# Patient Record
Sex: Male | Born: 1940
Health system: Northeastern US, Academic
[De-identification: ages and names within clinical notes are randomized; demographics above are authoritative.]

## PROBLEM LIST (undated history)

## (undated) DIAGNOSIS — H539 Unspecified visual disturbance: Secondary | ICD-10-CM

## (undated) DIAGNOSIS — R251 Tremor, unspecified: Secondary | ICD-10-CM

## (undated) DIAGNOSIS — M25512 Pain in left shoulder: Secondary | ICD-10-CM

## (undated) DIAGNOSIS — I639 Cerebral infarction, unspecified: Secondary | ICD-10-CM

## (undated) DIAGNOSIS — I10 Essential (primary) hypertension: Secondary | ICD-10-CM

## (undated) DIAGNOSIS — N200 Calculus of kidney: Secondary | ICD-10-CM

## (undated) DIAGNOSIS — T560X1A Toxic effect of lead and its compounds, accidental (unintentional), initial encounter: Secondary | ICD-10-CM

## (undated) DIAGNOSIS — G8929 Other chronic pain: Secondary | ICD-10-CM

## (undated) HISTORY — DX: Cerebral infarction, unspecified: I63.9

## (undated) HISTORY — DX: Unspecified visual disturbance: H53.9

## (undated) HISTORY — PX: CHOLECYSTECTOMY: SHX55

## (undated) HISTORY — PX: LITHOTRIPSY: SUR834

---

## 2005-02-27 ENCOUNTER — Emergency Department (HOSPITAL_COMMUNITY): Admission: EM | Admit: 2005-02-27 | Discharge: 2005-02-27 | Payer: Self-pay | Admitting: Emergency Medicine

## 2005-10-19 ENCOUNTER — Ambulatory Visit (HOSPITAL_COMMUNITY): Admission: RE | Admit: 2005-10-19 | Discharge: 2005-10-19 | Payer: Self-pay | Admitting: Urology

## 2010-11-16 ENCOUNTER — Other Ambulatory Visit: Payer: Self-pay | Admitting: Specialist

## 2010-11-16 DIAGNOSIS — R251 Tremor, unspecified: Secondary | ICD-10-CM

## 2010-11-18 ENCOUNTER — Ambulatory Visit
Admission: RE | Admit: 2010-11-18 | Discharge: 2010-11-18 | Disposition: A | Discharge status: Home / Self Care | Payer: Medicare Other | Source: Ambulatory Visit | Attending: Specialist | Admitting: Specialist

## 2010-11-18 DIAGNOSIS — R251 Tremor, unspecified: Secondary | ICD-10-CM

## 2011-10-25 ENCOUNTER — Other Ambulatory Visit (HOSPITAL_COMMUNITY): Payer: Self-pay | Admitting: Family Medicine

## 2011-10-25 DIAGNOSIS — E041 Nontoxic single thyroid nodule: Secondary | ICD-10-CM

## 2011-10-31 ENCOUNTER — Other Ambulatory Visit: Payer: Self-pay | Admitting: Neurosurgery

## 2011-10-31 DIAGNOSIS — M5412 Radiculopathy, cervical region: Secondary | ICD-10-CM

## 2011-11-01 ENCOUNTER — Encounter (HOSPITAL_COMMUNITY)
Admission: RE | Admit: 2011-11-01 | Discharge: 2011-11-01 | Disposition: A | Discharge status: Home / Self Care | Payer: Medicare Other | Source: Ambulatory Visit | Attending: Family Medicine | Admitting: Family Medicine

## 2011-11-01 DIAGNOSIS — E041 Nontoxic single thyroid nodule: Secondary | ICD-10-CM | POA: Insufficient documentation

## 2011-11-02 ENCOUNTER — Encounter (HOSPITAL_COMMUNITY)
Admission: RE | Admit: 2011-11-02 | Discharge: 2011-11-02 | Disposition: A | Discharge status: Home / Self Care | Payer: Medicare Other | Source: Ambulatory Visit | Attending: Family Medicine | Admitting: Family Medicine

## 2011-11-02 MED ORDER — SODIUM PERTECHNETATE TC 99M INJECTION
10.1000 | Freq: Once | INTRAVENOUS | Status: AC | PRN
  Administered 2011-11-02: 10.1 via INTRAVENOUS

## 2011-11-02 MED ORDER — SODIUM IODIDE I 131 CAPSULE: 14.1000 | Freq: Once | INTRAVENOUS | Status: AC | PRN

## 2011-11-05 ENCOUNTER — Ambulatory Visit
Admission: RE | Admit: 2011-11-05 | Discharge: 2011-11-05 | Disposition: A | Discharge status: Home / Self Care | Payer: Medicare Other | Source: Ambulatory Visit | Attending: Neurosurgery | Admitting: Neurosurgery

## 2011-11-05 DIAGNOSIS — M5412 Radiculopathy, cervical region: Secondary | ICD-10-CM

## 2011-11-09 ENCOUNTER — Ambulatory Visit (INDEPENDENT_AMBULATORY_CARE_PROVIDER_SITE_OTHER): Payer: Medicare Other | Admitting: Sports Medicine

## 2011-11-09 VITALS — BP 205/120 | Ht 68.0 in | Wt 197.0 lb

## 2011-11-09 DIAGNOSIS — M67919 Unspecified disorder of synovium and tendon, unspecified shoulder: Secondary | ICD-10-CM

## 2011-11-09 DIAGNOSIS — M719 Bursopathy, unspecified: Secondary | ICD-10-CM

## 2011-11-09 DIAGNOSIS — M75101 Unspecified rotator cuff tear or rupture of right shoulder, not specified as traumatic: Secondary | ICD-10-CM

## 2011-11-09 DIAGNOSIS — I10 Essential (primary) hypertension: Secondary | ICD-10-CM

## 2011-11-09 DIAGNOSIS — G20A1 Parkinson's disease without dyskinesia, without mention of fluctuations: Secondary | ICD-10-CM

## 2011-11-09 DIAGNOSIS — G20 Parkinson's disease: Secondary | ICD-10-CM

## 2011-11-09 DIAGNOSIS — M7520 Bicipital tendinitis, unspecified shoulder: Secondary | ICD-10-CM

## 2011-11-09 MED ORDER — NITROGLYCERIN 0.2 MG/HR TD PT24: MEDICATED_PATCH | TRANSDERMAL | Status: DC

## 2011-11-09 NOTE — Assessment & Plan Note (Signed)
He should begin some treatment for his hypertension I recommended with his primary care physician and started on medication to control this a better level.

## 2011-11-09 NOTE — Assessment & Plan Note (Signed)
Begin on a nitroglycerin protocol as I think he has some chance of healing this.  Avoid any heavy weights or any activities that make the shoulder feel more painful. Begin uneasy rehabilitation program with only 5 pound dumbbells.  Recheck this in one month and repeat his ultrasound scan.
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
## 2011-11-09 NOTE — Assessment & Plan Note (Signed)
Exam strongly suggest this and he has been advised of same by a physician he saw her earlier. I think he needs to consider further workup and possible treatment.
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
## 2011-11-09 NOTE — Assessment & Plan Note (Signed)
I gave him some specific exercises to keep the biceps strong. He needs to do these with low weights but emphasize curls and supination along with some shoulder flexion.

## 2011-12-19 ENCOUNTER — Ambulatory Visit: Payer: Medicare Other | Admitting: Sports Medicine

## 2012-01-01 ENCOUNTER — Telehealth: Payer: Self-pay | Admitting: *Deleted

## 2012-01-01 NOTE — Telephone Encounter (Signed)
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  Would like Dr. Darrick Penna to recommend an occupational health specialist.  He has heard lead toxicity could contribute to his hand trimmer.

## 2012-01-01 NOTE — Telephone Encounter (Signed)
Message copied by Mora Bellman on Mon Jan 01, 2012  4:00 PM ------      Message from: CERESI, [place] L      Created: Mon Jan 01, 2012  3:44 PM      Regarding: phone message      Contact: [phone]       Pt would like a referral for [facility] to be evaluated for  lead levels. Please call pt.

## 2012-01-03 NOTE — Telephone Encounter (Signed)
Per Dr. Darrick Penna- advised pt he could contact Kathrine Haddock MD- [facility] for [facility] system.  Pt agreeable.

## 2012-01-10 ENCOUNTER — Ambulatory Visit
Admission: RE | Admit: 2012-01-10 | Discharge: 2012-01-10 | Disposition: A | Discharge status: Home / Self Care | Payer: Medicare Other | Source: Ambulatory Visit | Attending: Sports Medicine | Admitting: Sports Medicine

## 2012-01-10 ENCOUNTER — Ambulatory Visit (INDEPENDENT_AMBULATORY_CARE_PROVIDER_SITE_OTHER): Payer: Medicare Other | Admitting: Sports Medicine

## 2012-01-10 VITALS — BP 144/92 | Ht 68.0 in | Wt 198.0 lb

## 2012-01-10 DIAGNOSIS — M25552 Pain in left hip: Secondary | ICD-10-CM | POA: Insufficient documentation

## 2012-01-10 DIAGNOSIS — M25559 Pain in unspecified hip: Secondary | ICD-10-CM

## 2012-01-10 DIAGNOSIS — M67919 Unspecified disorder of synovium and tendon, unspecified shoulder: Secondary | ICD-10-CM

## 2012-01-10 DIAGNOSIS — M75101 Unspecified rotator cuff tear or rupture of right shoulder, not specified as traumatic: Secondary | ICD-10-CM
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
## 2012-01-10 NOTE — Patient Instructions (Addendum)
Very nice to meet you.  Your tear in your supraspinatus has improved.  Keep doing the exercises. Keep the light weight as well.  You can try the nitro patch again but if giving too much of a headache then stop, you are healing without it.  We would like to see you back again in 6 weeks and we will rescan.

## 2012-01-10 NOTE — Assessment & Plan Note (Signed)
Improving. Unable to tolerate nitroglycerin encouraged to try a quarter of a patch. Patient will continue doing the exercises, patient had to discuss Prilosec therapy which we do not know enough evidence of the to suggest. Patient will followup in 6 weeks for rescan to make sure the patient is improving. On ultrasound today patient did have significant improvement in tear reducing from greater than 50% down to 30%. Patient also did have some neovascularization seen showing likelihood of healing process.

## 2012-01-12 ENCOUNTER — Telehealth: Payer: Self-pay | Admitting: Family Medicine

## 2012-01-12 NOTE — Telephone Encounter (Signed)
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  Give him a call and let him know this probably does explain the hip aching at night.  No sign of AVN.  tks      ----- Message -----         From: Rad Results In Interface         Sent: 01/10/2012   1:34 PM           To: Enid Baas, MD

## 2012-01-12 NOTE — Telephone Encounter (Signed)
LVM stating that X-ray look good, did not want to leave details on voicemail.  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

## 2012-04-17 ENCOUNTER — Ambulatory Visit (INDEPENDENT_AMBULATORY_CARE_PROVIDER_SITE_OTHER): Payer: Medicare Other | Admitting: Sports Medicine

## 2012-04-17 VITALS — BP 132/80 | Ht 68.0 in | Wt 192.0 lb

## 2012-04-17 DIAGNOSIS — G20 Parkinson's disease: Secondary | ICD-10-CM

## 2012-04-17 DIAGNOSIS — M75101 Unspecified rotator cuff tear or rupture of right shoulder, not specified as traumatic: Secondary | ICD-10-CM

## 2012-04-17 DIAGNOSIS — M5020 Other cervical disc displacement, unspecified cervical region: Secondary | ICD-10-CM

## 2012-04-17 DIAGNOSIS — M719 Bursopathy, unspecified: Secondary | ICD-10-CM

## 2012-04-17 NOTE — Assessment & Plan Note (Signed)
He is seeing Dr.andreas runheim at [facility]   planning to start him on Sinemet  I would wonder if this will have some benefit on his shoulder symptoms as I think he has tightness in his right arm that seems related to the Parkinson's

## 2012-04-17 NOTE — Assessment & Plan Note (Signed)
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

## 2012-04-17 NOTE — Assessment & Plan Note (Signed)
The rotator cuff muscles have regained normal strength and he seems to be healing very well  Keep up light weight rehabilitation exercises and we can recheck this in 2-3 months
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
## 2012-05-07 ENCOUNTER — Ambulatory Visit (INDEPENDENT_AMBULATORY_CARE_PROVIDER_SITE_OTHER): Payer: Medicare Other | Admitting: Family Medicine

## 2012-05-07 VITALS — BP 171/111 | Ht 68.0 in | Wt 192.0 lb

## 2012-05-07 DIAGNOSIS — M25552 Pain in left hip: Secondary | ICD-10-CM

## 2012-05-07 DIAGNOSIS — M25559 Pain in unspecified hip: Secondary | ICD-10-CM

## 2012-05-07 DIAGNOSIS — M67919 Unspecified disorder of synovium and tendon, unspecified shoulder: Secondary | ICD-10-CM

## 2012-05-07 DIAGNOSIS — M999 Biomechanical lesion, unspecified: Secondary | ICD-10-CM | POA: Insufficient documentation

## 2012-05-07 DIAGNOSIS — M75101 Unspecified rotator cuff tear or rupture of right shoulder, not specified as traumatic: Secondary | ICD-10-CM

## 2012-05-07 NOTE — Assessment & Plan Note (Signed)
Looking at patient's x-rays again I do think likelihood his pain is from some osteoporosis of the left hip. Patient is responding to osteopathic manipulation will continue to do as he continues to help. Patient though it does not improve will call again and we will do a hip injection under ultrasound guidance. Patient encouraged to continue to stay active and will continue to monitor. Patient will followup in 4-6 weeks for another manipulation or sooner if he would like to have the intra-articular hip injection.Eric Pope

## 2012-05-07 NOTE — Assessment & Plan Note (Signed)
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 I also thinkpatient does have some muscle spasm secondary to his Parkinson's disease and it also correlates with findings.
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
## 2012-05-07 NOTE — Assessment & Plan Note (Signed)
We did not rescan today and seems that patient is improving. Patient is having any setbacks or at followup we'll consider doing repeat imaging of the shoulder. Continue current therapy.

## 2012-05-08 ENCOUNTER — Telehealth: Payer: Self-pay | Admitting: Family Medicine
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
## 2012-06-04 ENCOUNTER — Encounter: Payer: Self-pay | Admitting: Family Medicine

## 2012-06-04 ENCOUNTER — Ambulatory Visit (INDEPENDENT_AMBULATORY_CARE_PROVIDER_SITE_OTHER): Payer: Medicare Other | Admitting: Family Medicine

## 2012-06-04 VITALS — BP 152/107 | HR 63 | Ht 68.0 in | Wt 192.0 lb

## 2012-06-04 DIAGNOSIS — M25559 Pain in unspecified hip: Secondary | ICD-10-CM

## 2012-06-04 DIAGNOSIS — M25552 Pain in left hip: Secondary | ICD-10-CM

## 2012-06-04 NOTE — Assessment & Plan Note (Signed)
Significant hip arthritis. Patient did have injection today.
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
## 2012-06-18 ENCOUNTER — Ambulatory Visit (INDEPENDENT_AMBULATORY_CARE_PROVIDER_SITE_OTHER): Payer: Medicare Other | Admitting: Family Medicine

## 2012-06-18 VITALS — BP 134/86 | Ht 68.0 in | Wt 192.0 lb

## 2012-06-18 DIAGNOSIS — M67919 Unspecified disorder of synovium and tendon, unspecified shoulder: Secondary | ICD-10-CM

## 2012-06-18 DIAGNOSIS — M25552 Pain in left hip: Secondary | ICD-10-CM

## 2012-06-18 DIAGNOSIS — M25559 Pain in unspecified hip: Secondary | ICD-10-CM

## 2012-06-18 DIAGNOSIS — M75101 Unspecified rotator cuff tear or rupture of right shoulder, not specified as traumatic: Secondary | ICD-10-CM

## 2012-06-18 NOTE — Progress Notes (Signed)
Patient is here actually for followup of his left hip pain as well as his right shoulder pain.   Patient was seen for his shoulder problem back in April 17, 2012. Patient was diagnosed with a right rotator cuff syndrome as well as cervical disc herniation. 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 Patient though states that the more he tries to lift at this time it seems to be giving him trouble and give him more pain. Patient points to the pain is mostly on the superior aspect of the shoulder that radiates to the lateral aspect. Patient states that overhead activity seems to be what caused the most discomfort. Patient denies any radiation of the pain down his arm or any numbness in his hand. Patient does have resting tremor on this side.  Patient regarding his left hip pain states that it is significantly improved. Patient did have intra-articular injection last time under ultrasound guidance. Patient states he is having no problem with it at this time and is very happy with the results.  Physical exam Blood pressure 134/86, height 5\' 8"  (1.727 m), weight 192 lb (87.091 kg). General: No apparent distress alert and oriented x3 mood and affect somewhat normal but masked facies. Respiratory: Patient's speak in full sentences and does not appear short of breath Skin: Warm dry intact with no signs of infection or rash Neuro: Cranial nerves II through XII are intact, neurovascularly intact in all extremities with 2+ DTRs and 2+ pulses. Right shoulder exam.  Pain with full abduction and forward flexion  Lacks 10 degrees with forward flexion and full abduction  Back scratch on rt is limited to T12, can go to T7 on lt  Strong empty can bilat  Hawkins and neer's Positive on rt  ER and IR strong on rt  ER and IR strong on rt with elbows bent 90 degrees  ER lag test negative on rt  IR  lag test on rt not as good on lt  Has persistent tremor on rt

## 2012-06-18 NOTE — Assessment & Plan Note (Signed)
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 Patient didn't tolerate the intra-articular procedure very well last time and has had significant improvement since. Continue range of motion exercises and strengthening exercises. Patient will followup as needed for this problem.

## 2012-06-18 NOTE — Assessment & Plan Note (Addendum)
Procedure note After verbal and written consent given pt was prepped with betadine.  1:4 kenalog 40 to lidocaine used in right shoulder with a 25-gauge 1- 1/2 inch needle.  Pt minimal bleeding dressed with band aid, Pt given red flags to look for pt had better pain control immediatly.   Patient had more signs of partial impingement syndrome to ensure rotator cuff tear today on exam. Patient did not have any significant findings on ultrasound. Patient did tolerate the procedure well and given home exercise program to do a regular basis. Patient will followup in 6 weeks if not better and we'll consider doing an MRI. In addition to this if he did not feel that this improves therapeutically and this may make [place] or if further imaging of his cervical neck as well.

## 2012-07-09 ENCOUNTER — Ambulatory Visit (INDEPENDENT_AMBULATORY_CARE_PROVIDER_SITE_OTHER): Payer: Medicare Other | Admitting: Family Medicine

## 2012-07-09 VITALS — BP 130/72 | Ht 68.0 in | Wt 195.0 lb

## 2012-07-09 DIAGNOSIS — M76899 Other specified enthesopathies of unspecified lower limb, excluding foot: Secondary | ICD-10-CM

## 2012-07-09 DIAGNOSIS — G20 Parkinson's disease: Secondary | ICD-10-CM

## 2012-07-09 DIAGNOSIS — M67919 Unspecified disorder of synovium and tendon, unspecified shoulder: Secondary | ICD-10-CM

## 2012-07-09 DIAGNOSIS — G20A1 Parkinson's disease without dyskinesia, without mention of fluctuations: Secondary | ICD-10-CM

## 2012-07-09 DIAGNOSIS — M75101 Unspecified rotator cuff tear or rupture of right shoulder, not specified as traumatic: Secondary | ICD-10-CM

## 2012-07-09 DIAGNOSIS — M7062 Trochanteric bursitis, left hip: Secondary | ICD-10-CM

## 2012-07-09 DIAGNOSIS — M719 Bursopathy, unspecified: Secondary | ICD-10-CM

## 2012-07-09 NOTE — Progress Notes (Signed)
Chief complaint left hip pain  Patient has been seen for his left hip pain and did have an intra-articular injection into his left hip. Patient did have some resolution of pain for approximately 2 weeks and has slowly started to hurt again. Patient states that the pain is mostly on the lateral aspect of the hip instead of the groin pain that he is having previously. Patient states it does hurt with certain movements and when he wakes up in the morning he has an exaggerated amount of pain. Patient denies any radiation down the leg denies any numbness or tingling. Patient denies any bowel or bladder incontinence or any association with back pain.  Physical exam Blood pressure 130/72, height 5\' 8"  (1.727 m), weight 195 lb (88.451 kg). General: No apparent distress alert and oriented x3 mood and affect somewhat normal but masked facies. And has a resting tremor on the right upper extremity. Respiratory: Patient's speak in full sentences and does not appear short of breath  Skin: Warm dry intact with no signs of infection or rash  Neuro: Cranial nerves II through XII are intact, neurovascularly intact in all extremities with 2+ DTRs and 2+ pulses.  Left hip exam: Patient does have pain with internal rotation mostly in the groin area. Patient is very tender to palpation over the greater trochanteric area and does have a mild fullness in this area. Patient has a negative straight leg test. Patient has good flexion and extension of the hip. He is neurovascularly intact distally. Right Shoulder exam shows the patient does have increasing range of motion. Patient does have internal rotation to T10 which is an improvement from L1 previously. Patient has negative impingement signs of the right shoulder. Rotator cuff strength is intact.

## 2012-07-09 NOTE — Assessment & Plan Note (Signed)
After verbal and written consent patient was prepped with alcohol swabs in a sterile fashion then did have injected in 5 cc of 0.5% Marcaine and 2 cc of 40 mg/dL Depo-Medrol into the greater trochanteric bursa area. Patient tolerated the procedure well with no blood loss. Patient had some improvement in pain immediately.  Patient given home exercise program with different exercises to do on a regular basis. Patient can continue his natural anti-inflammatory medications. Patient did up the idea of prolo therapy which I told him I do not think would harm anything but did not know if it would be beneficial per the evidence. Patient will return as needed.

## 2012-07-09 NOTE — Assessment & Plan Note (Signed)
Encourage patient again to potentially have this reviewed which patient declined. Patient did not want to see a specialist. Patient still corresponds his tremor to a high lead level.

## 2012-07-09 NOTE — Assessment & Plan Note (Signed)
Improving after steroid injection.

## 2012-07-09 NOTE — Patient Instructions (Signed)
Always good to see you. I am glad your shoulder is doing better. I did inject her greater trochanteric bursa today and I hope this helps her hip pain. I do think Prolo injection is worth a shot. If this as well as the Prolo therapy does not seem to help I do think you would need to talk to an orthopedic surgeon. You know where I am if you need me.

## 2013-01-07 ENCOUNTER — Ambulatory Visit: Payer: Medicare Other | Admitting: Family Medicine

## 2013-01-09 ENCOUNTER — Telehealth: Payer: Self-pay | Admitting: *Deleted

## 2013-01-09 ENCOUNTER — Encounter: Payer: Self-pay | Admitting: Family Medicine

## 2013-01-09 ENCOUNTER — Ambulatory Visit (INDEPENDENT_AMBULATORY_CARE_PROVIDER_SITE_OTHER): Payer: Medicare Other | Admitting: Family Medicine

## 2013-01-09 VITALS — BP 138/84 | HR 61 | Wt 199.0 lb

## 2013-01-09 DIAGNOSIS — M25559 Pain in unspecified hip: Secondary | ICD-10-CM

## 2013-01-09 DIAGNOSIS — M25512 Pain in left shoulder: Secondary | ICD-10-CM

## 2013-01-09 DIAGNOSIS — M76899 Other specified enthesopathies of unspecified lower limb, excluding foot: Secondary | ICD-10-CM

## 2013-01-09 DIAGNOSIS — M7062 Trochanteric bursitis, left hip: Secondary | ICD-10-CM

## 2013-01-09 DIAGNOSIS — M25552 Pain in left hip: Secondary | ICD-10-CM

## 2013-01-09 DIAGNOSIS — M999 Biomechanical lesion, unspecified: Secondary | ICD-10-CM | POA: Insufficient documentation

## 2013-01-09 NOTE — Telephone Encounter (Signed)
Left msg on triage stating wanting to speak with Dr.Smith about getting a referral to see orthopedic concerning his shoulder...lmb
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
## 2013-01-09 NOTE — Assessment & Plan Note (Signed)
Patient on testing also has weakness of hip abductors. Patient given hip abductor exercises as well as SI joint mobilization.> 40 minutes with patient today he is greater than 50% counseling on diagnosis and treatment. I also showed him different exercises and ways to help his activities of daily living. Encourage him to see neurologist again for Parkinson's disease. Patient will try conservative therapy and does not get better we'll have him come back. At that time we'll do ultrasound of the hip as well as likely injection.

## 2013-01-09 NOTE — Assessment & Plan Note (Signed)
Decision today to treat with OMT was based on Physical Exam  After verbal consent patient was treated with  ME, FPR techniques in  thoracic, lumbar and sacral areas  Patient tolerated the procedure well with improvement in symptoms  Patient given exercises, stretches and lifestyle modifications  See medications in patient instructions if given  Patient will follow up in 4-6 weeks 

## 2013-01-09 NOTE — Assessment & Plan Note (Signed)
Decision today to treat with OMT was based on Physical Exam  After verbal consent patient was treated with ME, FPR techniques in thoracic, lumbar and sacral  And illium areas  Patient tolerated the procedure well with improvement in symptoms  Patient given exercises, stretches and lifestyle modifications  See medications in patient instructions if given  Patient will follow up in 4-6 weeks

## 2013-01-09 NOTE — Patient Instructions (Signed)
Sacroiliac Joint Mobilization and Rehab 1. Work on pretzel stretching, shoulder back and leg draped in front. 3-5 sets, 30 sec.. 2. hip abductor rotations. standing, hip flexion and rotation outward then inward. 3 sets, 15 reps. when can do comfortably, add ankle weights starting at 2 pounds.  3. cross over stretching - shoulder back to ground, same side leg crossover. 3-5 sets for 30 min..  4. rolling up and back knees to chest and rocking. 5. sacral tilt - 5 sets, hold for 5-10 seconds  Really focus on hip abductor strengthening  Also consider yoga  Think about ESI- Talk to your nuerosurgeon.   Come back in 2-3 weeks and if still have pain on side of hip will do injection.
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
## 2013-01-10 NOTE — Telephone Encounter (Signed)
Please advise 

## 2013-01-10 NOTE — Telephone Encounter (Signed)
Referral sent in. Please call and tell him I more than willing to see him for his shoulder again to see if I can help but if he would like to have put in referral for him to see Dr. Ave Filter at [facility] orth[facility]. If he would like to see the orthopedic surgeon please make an appointment for him.

## 2013-01-10 NOTE — Telephone Encounter (Signed)
Left msg on vmail

## 2013-02-05 ENCOUNTER — Other Ambulatory Visit: Payer: Self-pay | Admitting: Orthopedic Surgery

## 2013-02-05 ENCOUNTER — Ambulatory Visit: Payer: Medicare Other | Admitting: Sports Medicine

## 2013-02-05 DIAGNOSIS — M25511 Pain in right shoulder: Secondary | ICD-10-CM

## 2013-02-14 ENCOUNTER — Ambulatory Visit
Admission: RE | Admit: 2013-02-14 | Discharge: 2013-02-14 | Disposition: A | Discharge status: Home / Self Care | Payer: Medicare Other | Source: Ambulatory Visit | Attending: Orthopedic Surgery | Admitting: Orthopedic Surgery

## 2013-02-14 DIAGNOSIS — M25511 Pain in right shoulder: Secondary | ICD-10-CM

## 2013-02-27 ENCOUNTER — Other Ambulatory Visit: Payer: Medicare Other

## 2013-03-02 ENCOUNTER — Ambulatory Visit
Admission: RE | Admit: 2013-03-02 | Discharge: 2013-03-02 | Disposition: A | Discharge status: Home / Self Care | Payer: Medicare Other | Source: Ambulatory Visit | Attending: Orthopedic Surgery | Admitting: Orthopedic Surgery

## 2013-03-06 ENCOUNTER — Other Ambulatory Visit: Payer: Medicare Other

## 2016-07-10 ENCOUNTER — Encounter (HOSPITAL_COMMUNITY): Payer: Self-pay

## 2016-07-10 ENCOUNTER — Inpatient Hospital Stay (HOSPITAL_COMMUNITY)
Admission: EM | Admit: 2016-07-10 | Discharge: 2016-07-14 | DRG: 065 | Disposition: A | Discharge status: Rehabilitation Facility | Payer: Medicare Other | Attending: Internal Medicine | Admitting: Internal Medicine

## 2016-07-10 ENCOUNTER — Emergency Department (HOSPITAL_COMMUNITY): Payer: Medicare Other

## 2016-07-10 DIAGNOSIS — R001 Bradycardia, unspecified: Secondary | ICD-10-CM | POA: Diagnosis present

## 2016-07-10 DIAGNOSIS — H539 Unspecified visual disturbance: Secondary | ICD-10-CM | POA: Diagnosis present

## 2016-07-10 DIAGNOSIS — R414 Neurologic neglect syndrome: Secondary | ICD-10-CM | POA: Diagnosis present

## 2016-07-10 DIAGNOSIS — R259 Unspecified abnormal involuntary movements: Secondary | ICD-10-CM

## 2016-07-10 DIAGNOSIS — G20 Parkinson's disease: Secondary | ICD-10-CM | POA: Diagnosis present

## 2016-07-10 DIAGNOSIS — R29711 NIHSS score 11: Secondary | ICD-10-CM | POA: Diagnosis present

## 2016-07-10 DIAGNOSIS — Y92410 Unspecified street and highway as the place of occurrence of the external cause: Secondary | ICD-10-CM | POA: Diagnosis not present

## 2016-07-10 DIAGNOSIS — I350 Nonrheumatic aortic (valve) stenosis: Secondary | ICD-10-CM | POA: Diagnosis present

## 2016-07-10 DIAGNOSIS — I63511 Cerebral infarction due to unspecified occlusion or stenosis of right middle cerebral artery: Secondary | ICD-10-CM | POA: Diagnosis present

## 2016-07-10 DIAGNOSIS — Z79899 Other long term (current) drug therapy: Secondary | ICD-10-CM

## 2016-07-10 DIAGNOSIS — Z7982 Long term (current) use of aspirin: Secondary | ICD-10-CM

## 2016-07-10 DIAGNOSIS — Z833 Family history of diabetes mellitus: Secondary | ICD-10-CM

## 2016-07-10 DIAGNOSIS — I160 Hypertensive urgency: Secondary | ICD-10-CM | POA: Diagnosis present

## 2016-07-10 DIAGNOSIS — Z87442 Personal history of urinary calculi: Secondary | ICD-10-CM | POA: Diagnosis not present

## 2016-07-10 DIAGNOSIS — I4581 Long QT syndrome: Secondary | ICD-10-CM | POA: Diagnosis present

## 2016-07-10 DIAGNOSIS — I459 Conduction disorder, unspecified: Secondary | ICD-10-CM | POA: Diagnosis present

## 2016-07-10 DIAGNOSIS — I10 Essential (primary) hypertension: Secondary | ICD-10-CM | POA: Diagnosis present

## 2016-07-10 DIAGNOSIS — K5900 Constipation, unspecified: Secondary | ICD-10-CM | POA: Diagnosis present

## 2016-07-10 DIAGNOSIS — I4891 Unspecified atrial fibrillation: Secondary | ICD-10-CM

## 2016-07-10 DIAGNOSIS — D496 Neoplasm of unspecified behavior of brain: Secondary | ICD-10-CM

## 2016-07-10 DIAGNOSIS — I63331 Cerebral infarction due to thrombosis of right posterior cerebral artery: Secondary | ICD-10-CM | POA: Diagnosis not present

## 2016-07-10 DIAGNOSIS — G219 Secondary parkinsonism, unspecified: Secondary | ICD-10-CM | POA: Diagnosis not present

## 2016-07-10 DIAGNOSIS — Z8249 Family history of ischemic heart disease and other diseases of the circulatory system: Secondary | ICD-10-CM

## 2016-07-10 DIAGNOSIS — R29810 Facial weakness: Secondary | ICD-10-CM | POA: Diagnosis present

## 2016-07-10 DIAGNOSIS — I639 Cerebral infarction, unspecified: Secondary | ICD-10-CM | POA: Diagnosis present

## 2016-07-10 DIAGNOSIS — R269 Unspecified abnormalities of gait and mobility: Secondary | ICD-10-CM | POA: Diagnosis not present

## 2016-07-10 DIAGNOSIS — I169 Hypertensive crisis, unspecified: Secondary | ICD-10-CM | POA: Diagnosis not present

## 2016-07-10 DIAGNOSIS — E785 Hyperlipidemia, unspecified: Secondary | ICD-10-CM | POA: Diagnosis present

## 2016-07-10 DIAGNOSIS — H53462 Homonymous bilateral field defects, left side: Secondary | ICD-10-CM | POA: Diagnosis present

## 2016-07-10 DIAGNOSIS — I480 Paroxysmal atrial fibrillation: Secondary | ICD-10-CM | POA: Diagnosis present

## 2016-07-10 DIAGNOSIS — T560X1A Toxic effect of lead and its compounds, accidental (unintentional), initial encounter: Secondary | ICD-10-CM

## 2016-07-10 DIAGNOSIS — W1830XA Fall on same level, unspecified, initial encounter: Secondary | ICD-10-CM | POA: Diagnosis present

## 2016-07-10 DIAGNOSIS — I6789 Other cerebrovascular disease: Secondary | ICD-10-CM | POA: Diagnosis not present

## 2016-07-10 DIAGNOSIS — G252 Other specified forms of tremor: Secondary | ICD-10-CM

## 2016-07-10 DIAGNOSIS — I63311 Cerebral infarction due to thrombosis of right middle cerebral artery: Secondary | ICD-10-CM | POA: Diagnosis not present

## 2016-07-10 HISTORY — DX: Toxic effect of lead and its compounds, accidental (unintentional), initial encounter: T56.0X1A

## 2016-07-10 HISTORY — DX: Tremor, unspecified: R25.1

## 2016-07-10 HISTORY — DX: Pain in left shoulder: M25.512

## 2016-07-10 HISTORY — DX: Calculus of kidney: N20.0

## 2016-07-10 HISTORY — DX: Other chronic pain: G89.29

## 2016-07-10 HISTORY — DX: Essential (primary) hypertension: I10

## 2016-07-10 LAB — RAPID URINE DRUG SCREEN, HOSP PERFORMED
Amphetamines: NOT DETECTED
Barbiturates: NOT DETECTED
Benzodiazepines: NOT DETECTED
COCAINE: NOT DETECTED
OPIATES: NOT DETECTED
TETRAHYDROCANNABINOL: NOT DETECTED

## 2016-07-10 LAB — COMPREHENSIVE METABOLIC PANEL
ALBUMIN: 4.8 g/dL (ref 3.5–5.0)
ALT: 21 U/L (ref 17–63)
ANION GAP: 11 (ref 5–15)
AST: 27 U/L (ref 15–41)
Alkaline Phosphatase: 76 U/L (ref 38–126)
BILIRUBIN TOTAL: 1.5 mg/dL — AB (ref 0.3–1.2)
BUN: 11 mg/dL (ref 6–20)
CO2: 26 mmol/L (ref 22–32)
Calcium: 9.9 mg/dL (ref 8.9–10.3)
Chloride: 103 mmol/L (ref 101–111)
Creatinine, Ser: 0.9 mg/dL (ref 0.61–1.24)
GFR calc non Af Amer: >60 mL/min (ref >60)
GLUCOSE: 106 mg/dL — AB (ref 65–99)
POTASSIUM: 4.1 mmol/L (ref 3.5–5.1)
SODIUM: 140 mmol/L (ref 135–145)
Total Protein: 7 g/dL (ref 6.5–8.1)

## 2016-07-10 LAB — I-STAT CHEM 8, ED
BUN: 15 mg/dL (ref 6–20)
Calcium, Ion: 1.23 mmol/L (ref 1.15–1.40)
Chloride: 104 mmol/L (ref 101–111)
Creatinine, Ser: 0.9 mg/dL (ref 0.61–1.24)
Glucose, Bld: 99 mg/dL (ref 65–99)
HCT: 44 % (ref 39.0–52.0)
Hemoglobin: 15 g/dL (ref 13.0–17.0)
Potassium: 4.2 mmol/L (ref 3.5–5.1)
Sodium: 143 mmol/L (ref 135–145)
TCO2: 29 mmol/L (ref 0–100)

## 2016-07-10 LAB — URINALYSIS, ROUTINE W REFLEX MICROSCOPIC
BILIRUBIN URINE: NEGATIVE
Glucose, UA: NEGATIVE mg/dL
HGB URINE DIPSTICK: NEGATIVE
KETONES UR: 5 mg/dL — AB
Leukocytes, UA: NEGATIVE
NITRITE: NEGATIVE
PROTEIN: 30 mg/dL — AB
SPECIFIC GRAVITY, URINE: 1.016 (ref 1.005–1.030)
SQUAMOUS EPITHELIAL / LPF: NONE SEEN
pH: 7 (ref 5.0–8.0)

## 2016-07-10 LAB — CBC
HCT: 45.1 % (ref 39.0–52.0)
Hemoglobin: 15.2 g/dL (ref 13.0–17.0)
MCH: 30.6 pg (ref 26.0–34.0)
MCHC: 33.7 g/dL (ref 30.0–36.0)
MCV: 90.9 fL (ref 78.0–100.0)
Platelets: 193 10*3/uL (ref 150–400)
RBC: 4.96 MIL/uL (ref 4.22–5.81)
RDW: 13 % (ref 11.5–15.5)
WBC: 7.5 10*3/uL (ref 4.0–10.5)

## 2016-07-10 LAB — APTT: aPTT: 29 seconds (ref 24–36)

## 2016-07-10 LAB — I-STAT TROPONIN, ED: Troponin i, poc: 0 ng/mL (ref 0.00–0.08)

## 2016-07-10 LAB — DIFFERENTIAL
Basophils Absolute: 0 10*3/uL (ref 0.0–0.1)
Basophils Relative: 0 %
EOS ABS: 0 10*3/uL (ref 0.0–0.7)
EOS PCT: 1 %
Lymphocytes Relative: 12 %
Lymphs Abs: 0.9 10*3/uL (ref 0.7–4.0)
MONO ABS: 0.5 10*3/uL (ref 0.1–1.0)
Monocytes Relative: 7 %
Neutro Abs: 6.1 10*3/uL (ref 1.7–7.7)
Neutrophils Relative %: 80 %

## 2016-07-10 LAB — PROTIME-INR
INR: 1.04
Prothrombin Time: 13.6 seconds (ref 11.4–15.2)
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
## 2016-07-11 ENCOUNTER — Inpatient Hospital Stay (HOSPITAL_COMMUNITY): Payer: Medicare Other

## 2016-07-11 ENCOUNTER — Encounter (HOSPITAL_COMMUNITY): Payer: Self-pay | Admitting: Internal Medicine

## 2016-07-11 DIAGNOSIS — I639 Cerebral infarction, unspecified: Secondary | ICD-10-CM

## 2016-07-11 DIAGNOSIS — R259 Unspecified abnormal involuntary movements: Secondary | ICD-10-CM

## 2016-07-11 DIAGNOSIS — T560X1A Toxic effect of lead and its compounds, accidental (unintentional), initial encounter: Secondary | ICD-10-CM

## 2016-07-11 DIAGNOSIS — G252 Other specified forms of tremor: Secondary | ICD-10-CM

## 2016-07-11 DIAGNOSIS — G219 Secondary parkinsonism, unspecified: Secondary | ICD-10-CM

## 2016-07-11 DIAGNOSIS — I10 Essential (primary) hypertension: Secondary | ICD-10-CM

## 2016-07-11 DIAGNOSIS — I63311 Cerebral infarction due to thrombosis of right middle cerebral artery: Secondary | ICD-10-CM

## 2016-07-11 LAB — LIPID PANEL
Cholesterol: 197 mg/dL (ref 0–200)
HDL: 66 mg/dL (ref >40)
LDL CALC: 118 mg/dL — AB (ref 0–99)
TRIGLYCERIDES: 65 mg/dL (ref <150)
Total CHOL/HDL Ratio: 3 RATIO
VLDL: 13 mg/dL (ref 0–40)

## 2016-07-11 MED ORDER — STROKE: EARLY STAGES OF RECOVERY BOOK
Freq: Once | Status: AC
  Administered 2016-07-11: 1
  Filled 2016-07-11: qty 1

## 2016-07-11 MED ORDER — ACETAMINOPHEN 325 MG PO TABS
650.0000 mg | ORAL_TABLET | ORAL | Status: DC | PRN
  Administered 2016-07-12 – 2016-07-13 (×2): 650 mg via ORAL
  Filled 2016-07-11 (×2): qty 2

## 2016-07-11 MED ORDER — CLOPIDOGREL BISULFATE 75 MG PO TABS
75.0000 mg | ORAL_TABLET | Freq: Every day | ORAL | Status: DC
  Administered 2016-07-11 – 2016-07-12 (×2): 75 mg via ORAL
  Filled 2016-07-11 (×2): qty 1

## 2016-07-11 MED ORDER — FOLIC ACID 5 MG/ML IJ SOLN
1.0000 mg | Freq: Every day | INTRAMUSCULAR | Status: DC
  Administered 2016-07-11 – 2016-07-14 (×3): 1 mg via INTRAVENOUS
  Filled 2016-07-11 (×4): qty 0.2

## 2016-07-11 MED ORDER — LORAZEPAM 2 MG/ML IJ SOLN
1.0000 mg | Freq: Once | INTRAMUSCULAR | Status: AC
  Administered 2016-07-11: 1 mg via INTRAVENOUS
  Filled 2016-07-11: qty 1

## 2016-07-11 MED ORDER — THIAMINE HCL 100 MG/ML IJ SOLN
100.0000 mg | Freq: Every day | INTRAMUSCULAR | Status: DC
  Administered 2016-07-11 – 2016-07-14 (×4): 100 mg via INTRAVENOUS
  Filled 2016-07-11 (×5): qty 2

## 2016-07-11 MED ORDER — SPIRONOLACTONE 25 MG PO TABS
12.5000 mg | ORAL_TABLET | Freq: Every day | ORAL | Status: DC
  Administered 2016-07-11: 12.5 mg via ORAL
  Filled 2016-07-11: qty 1

## 2016-07-11 MED ORDER — ACETAMINOPHEN 650 MG RE SUPP: 650.0000 mg | RECTAL | Status: DC | PRN

## 2016-07-11 MED ORDER — LORAZEPAM 2 MG/ML IJ SOLN
1.0000 mg | Freq: Once | INTRAMUSCULAR | Status: AC
  Administered 2016-07-12: 1 mg via INTRAVENOUS
  Filled 2016-07-11: qty 1

## 2016-07-11 MED ORDER — ASPIRIN 325 MG PO TABS
325.0000 mg | ORAL_TABLET | Freq: Every day | ORAL | Status: DC
  Administered 2016-07-11: 325 mg via ORAL
  Filled 2016-07-11: qty 1

## 2016-07-11 MED ORDER — ATORVASTATIN CALCIUM 10 MG PO TABS
20.0000 mg | ORAL_TABLET | Freq: Every day | ORAL | Status: DC
  Administered 2016-07-11 – 2016-07-14 (×4): 20 mg via ORAL
  Filled 2016-07-11 (×3): qty 2

## 2016-07-11 MED ORDER — ASPIRIN 300 MG RE SUPP: 300.0000 mg | Freq: Every day | RECTAL | Status: DC

## 2016-07-11 MED ORDER — ACETAMINOPHEN 160 MG/5ML PO SOLN
650.0000 mg | ORAL | Status: DC | PRN
  Administered 2016-07-11: 650 mg
  Filled 2016-07-11: qty 20.3

## 2016-07-11 NOTE — Progress Notes (Signed)
Pt unable to remember to call for assistance for needs, ie getting out of bed. Pt has left visual field cut---left neglect, has severe BUE tremors, pt unsafe to be up without assistance. Pt has attempted x2 to get up this am, once from chair and once from the bed. Pt made it to standing from chair before staff got into the room after chair alarm sounded. Chair and bed alarms in use.  Pt room at the end of the hall 5C02--less traffic flow of staff to reach pt quickly when alarms sound, therefore pt moved to room 5C12 closer to the nurses station with staff constantly going by his room, also in a camera monitored room to assist with pt safety. Pt informed of reason for move to room 5C12 for safety and informed of camera monitoring, he verbalized understanding.
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
## 2016-07-11 NOTE — Progress Notes (Signed)
*  PRELIMINARY RESULTS* Vascular Ultrasound Carotid Duplex (Doppler) has been completed.  Preliminary findings: Bilateral: No significant (1-39%) ICA stenosis. Antegrade vertebral flow.   Landry Mellow, RDMS, RVT  07/11/2016, 3:11 PM
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
## 2016-07-11 NOTE — Progress Notes (Signed)
Physical medicine rehabilitation consult requested chart reviewed. Patient admitted 12 hours ago for workup of CVA. Dr. Leonie Man of neurology services request formal rehabilitation consult to be done 07/12/2016 to allow further neurological workup to be completed.

## 2016-07-11 NOTE — Progress Notes (Signed)
Rehab Admissions Coordinator Note:  Patient was screened by Cleatrice Burke for appropriateness for an Inpatient Acute Rehab Consult per PT recommendation.  At this time, we are recommending await further progress before determining rehab venue options. Cleatrice Burke 07/11/2016, 12:30 PM  I can be reached at [phone].

## 2016-07-11 NOTE — Care Management Note (Signed)
Case Management Note  Patient Details  Name: Eric Pope MRN: [id number] Date of Birth: July 31, 1940  Subjective/Objective:     Patient was admitted with CVA. Lives at home alone. CM will follow for discharge needs pending PT/OT evals and physician orders.                Action/Plan:   Expected Discharge Date:                  Expected Discharge Plan:     In-House Referral:     Discharge planning Services     Post Acute Care Choice:    Choice offered to:     DME Arranged:    DME Agency:     HH Arranged:    HH Agency:     Status of Service:     If discussed at [employer] of Stay Meetings, dates discussed:    Additional Comments:  Rolm Baptise, RN 07/11/2016, 9:21 AM

## 2016-07-12 ENCOUNTER — Inpatient Hospital Stay (HOSPITAL_COMMUNITY): Payer: Medicare Other

## 2016-07-12 DIAGNOSIS — I6789 Other cerebrovascular disease: Secondary | ICD-10-CM

## 2016-07-12 LAB — BASIC METABOLIC PANEL
Anion gap: 8 (ref 5–15)
BUN: 23 mg/dL — AB (ref 6–20)
CHLORIDE: 107 mmol/L (ref 101–111)
CO2: 24 mmol/L (ref 22–32)
Calcium: 9.2 mg/dL (ref 8.9–10.3)
Creatinine, Ser: 1.07 mg/dL (ref 0.61–1.24)
GFR calc Af Amer: >60 mL/min (ref >60)
GFR calc non Af Amer: >60 mL/min (ref >60)
Glucose, Bld: 94 mg/dL (ref 65–99)
POTASSIUM: 3.4 mmol/L — AB (ref 3.5–5.1)
SODIUM: 139 mmol/L (ref 135–145)

## 2016-07-12 LAB — CBC
HCT: 42.8 % (ref 39.0–52.0)
HEMOGLOBIN: 14.3 g/dL (ref 13.0–17.0)
MCH: 30.5 pg (ref 26.0–34.0)
MCHC: 33.4 g/dL (ref 30.0–36.0)
MCV: 91.3 fL (ref 78.0–100.0)
Platelets: 194 10*3/uL (ref 150–400)
RBC: 4.69 MIL/uL (ref 4.22–5.81)
RDW: 13.1 % (ref 11.5–15.5)
WBC: 7 10*3/uL (ref 4.0–10.5)

## 2016-07-12 LAB — HEMOGLOBIN A1C
Hgb A1c MFr Bld: 5.3 % (ref 4.8–5.6)
Mean Plasma Glucose: 105

## 2016-07-12 LAB — VAS US CAROTID
LCCAPDIAS: 18 cm/s
LCCAPSYS: 55 cm/s
LEFT ECA DIAS: 22 cm/s
LEFT VERTEBRAL DIAS: 17 cm/s
LICADDIAS: -21 cm/s
LICADSYS: -44 cm/s
LICAPSYS: -48 cm/s
Left CCA dist dias: 26 cm/s
Left CCA dist sys: 67 cm/s
Left ICA prox dias: -16 cm/s
RIGHT ECA DIAS: 10 cm/s
RIGHT VERTEBRAL DIAS: 7 cm/s
Right CCA prox dias: 21 cm/s
Right CCA prox sys: 74 cm/s
Right cca dist sys: -40 cm/s

## 2016-07-12 LAB — ECHOCARDIOGRAM COMPLETE
Height: 71 in
Weight: 3184 oz

## 2016-07-12 MED ORDER — POTASSIUM CHLORIDE CRYS ER 20 MEQ PO TBCR
40.0000 meq | EXTENDED_RELEASE_TABLET | Freq: Once | ORAL | Status: AC
  Administered 2016-07-12: 40 meq via ORAL
  Filled 2016-07-12: qty 2

## 2016-07-12 NOTE — Progress Notes (Signed)
  Echocardiogram 2D Echocardiogram has been performed.  Bobbye Charleston 07/12/2016, 1:50 PM
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
## 2016-07-12 NOTE — Clinical Social Work Note (Signed)
CSW met with pt to address consult for SNF backup to CIR. Pt agreeable to SNF search. CSW assessment to follow. CSW will continue to follow.   Oretha Ellis, [place], [place] Work [phone]

## 2016-07-13 ENCOUNTER — Inpatient Hospital Stay (HOSPITAL_COMMUNITY): Payer: Medicare Other

## 2016-07-13 ENCOUNTER — Encounter (HOSPITAL_COMMUNITY)
Admission: EM | Disposition: A | Discharge status: Rehabilitation Facility | Payer: Self-pay | Source: Home / Self Care | Attending: Internal Medicine

## 2016-07-13 DIAGNOSIS — I480 Paroxysmal atrial fibrillation: Secondary | ICD-10-CM

## 2016-07-13 DIAGNOSIS — I63331 Cerebral infarction due to thrombosis of right posterior cerebral artery: Secondary | ICD-10-CM

## 2016-07-13 SURGERY — ECHOCARDIOGRAM, TRANSESOPHAGEAL
Anesthesia: Moderate Sedation

## 2016-07-13 MED ORDER — RIVAROXABAN 20 MG PO TABS: 20.0000 mg | ORAL_TABLET | Freq: Every day | ORAL | Status: DC

## 2016-07-13 MED ORDER — MELATONIN 3 MG PO TABS
3.0000 mg | ORAL_TABLET | Freq: Every day | ORAL | Status: DC
  Administered 2016-07-13: 3 mg via ORAL
  Filled 2016-07-13 (×2): qty 1

## 2016-07-13 MED ORDER — ATORVASTATIN CALCIUM 20 MG PO TABS: 20.0000 mg | ORAL_TABLET | Freq: Every day | ORAL | Status: DC

## 2016-07-13 MED ORDER — RIVAROXABAN 20 MG PO TABS
20.0000 mg | ORAL_TABLET | Freq: Every day | ORAL | Status: DC
  Administered 2016-07-13 – 2016-07-14 (×2): 20 mg via ORAL
  Filled 2016-07-13 (×2): qty 1

## 2016-07-13 MED ORDER — NON FORMULARY: 3.0000 mg | Freq: Once | Status: DC

## 2016-07-13 MED ORDER — FOLIC ACID 1 MG PO TABS: 1.0000 mg | ORAL_TABLET | Freq: Every day | ORAL | 0 refills | Status: DC

## 2016-07-13 MED ORDER — IOPAMIDOL (ISOVUE-300) INJECTION 61%
INTRAVENOUS | Status: AC
  Administered 2016-07-13: 75 mL
  Filled 2016-07-13: qty 75

## 2016-07-13 MED ORDER — LABETALOL HCL 5 MG/ML IV SOLN: 10.0000 mg | INTRAVENOUS | Status: DC | PRN

## 2016-07-13 MED ORDER — VITAMIN B-1 100 MG PO TABS: 100.0000 mg | ORAL_TABLET | Freq: Every day | ORAL | Status: DC

## 2016-07-13 MED ORDER — POTASSIUM CHLORIDE CRYS ER 20 MEQ PO TBCR
40.0000 meq | EXTENDED_RELEASE_TABLET | Freq: Once | ORAL | Status: AC
  Administered 2016-07-13: 40 meq via ORAL
  Filled 2016-07-13: qty 2
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
## 2016-07-13 NOTE — Progress Notes (Signed)
Nurse notified MD T.Oster of pt's BP 165/105. Pt's telemetry removed,  leads will not stay on properly due to pt playing with leads.
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
## 2016-07-13 NOTE — NC FL2 (Signed)
[place] LEVEL OF CARE SCREENING TOOL     IDENTIFICATION  Patient Name: Eric Pope Birthdate: 1940-10-22 Sex: male Admission Date (Current Location): 07/10/2016  [facility] and [place] Number:  Herbalist and Address:  [facility] [place], [place] [address], [place], [place] [address]      Provider Number: [id number]  Attending Physician Name and Address:  Thurnell Lose, MD  Relative Name and Phone Number:       Current Level of Care: Hospital Recommended Level of Care: [place] Prior Approval Number:    Date Approved/Denied:   PASRR Number: [id number]  Discharge Plan: SNF    Current Diagnoses: Patient Active Problem List   Diagnosis Date Noted  . 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 Acute ischemic stroke ([place])   . CVA (cerebral vascular accident) ([place]) 07/10/2016  . Nonallopathic lesion of sacroiliac region 01/09/2013  . Greater trochanteric bursitis of left hip 07/09/2012  . Nonallopathic lesion of lumbosacral region 05/07/2012  . 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 Left hip pain 01/10/2012  . Rotator cuff syndrome of right shoulder 11/09/2011  . Bicipital tendinitis 11/09/2011  . Parkinson's disease ([place]) 11/09/2011  . Hypertension 11/09/2011    Orientation RESPIRATION BLADDER Height & Weight     Self, Time, Situation, Place  Normal Incontinent Weight: 199 lb (90.3 kg) Height:  5\' 11"  (180.3 cm)  BEHAVIORAL SYMPTOMS/MOOD NEUROLOGICAL BOWEL NUTRITION STATUS      Continent Diet (Heart healthy; thin liquids)  AMBULATORY STATUS COMMUNICATION OF NEEDS Skin   Limited Assist Verbally Normal                       Personal Care Assistance Level of Assistance  Bathing, Dressing, Feeding Bathing Assistance: Maximum assistance Feeding assistance: Limited assistance Dressing Assistance: Maximum assistance     Functional Limitations Info  Sight, Hearing, Speech Sight  Info: Adequate Hearing Info: Adequate Speech Info: Adequate    SPECIAL CARE FACTORS FREQUENCY  PT (By licensed PT), OT (By licensed OT), Speech therapy     PT Frequency: 5 OT Frequency: 5     Speech Therapy Frequency: 5      Contractures Contractures Info: Not present    Additional Factors Info  Code Status, Allergies Code Status Info: Full Allergies Info: Ciprofloxacin, Fish-derived Products, Kale, Latex, Milk-related Compounds           Current Medications (07/13/2016):  This is the current hospital active medication list Current Facility-Administered Medications  Medication Dose Route Frequency Provider Last Rate Last Dose  . acetaminophen (TYLENOL) tablet 650 mg  650 mg Oral Q4H PRN Lily Kocher, MD   650 mg at 07/12/16 1909   Or  . acetaminophen (TYLENOL) solution 650 mg  650 mg Per Tube Q4H PRN Lily Kocher, MD   650 mg at 07/11/16 0210   Or  . acetaminophen (TYLENOL) suppository 650 mg  650 mg Rectal Q4H PRN Lily Kocher, MD      . atorvastatin (LIPITOR) tablet 20 mg  20 mg Oral q1800 Donzetta Starch, NP   20 mg at 07/12/16 1824  . folic acid injection 1 mg  1 mg Intravenous Daily Belkys A Regalado, MD   1 mg at 07/13/16 1118  . rivaroxaban (XARELTO) tablet 20 mg  20 mg Oral Q supper Rebecka Apley, RPH      . thiamine (B-1) injection 100 mg  100 mg Intravenous Daily Belkys A  Regalado, MD   100 mg at 07/13/16 1119     Discharge Medications: Please see discharge summary for a list of discharge medications.  Relevant Imaging Results:  Relevant Lab Results:   Additional Information SSN: [id number]  Truitt Merle, LCSW

## 2016-07-13 NOTE — Progress Notes (Signed)
Occupational Therapy Treatment Patient Details Name: Eric Pope MRN: [id number] DOB: 04-23-1941 Today's Date: 07/13/2016    History of present illness Eric Pope is a 76 y.o. gentleman with a history of HTN, lead poisoning, resting tremor, and chronic shoulder pain who was in his baseline state of health until yesterday.  He developed new gait instability and difficulty with fine motor coordination (had trouble dressing himself).  CT positive for Right parietal hypoattenuation is most concerning for an acute posterior right MCA territory infarct.   OT comments  Pt continues to demonstrate poor sequencing, short-term memory, and problem solving skills during ADL. Additionally, he is disoriented to situation and demonstrates little understanding of causes of his current limitations. He perseverates on information unrelated to session and required constant redirection throughout. During session, he demonstrated intellectual awareness and required verbal and tactile cues to follow one-step commands. He frequently asked if he was supposed to stay here or if he needed to get a ride home and was unable to remember topics of education after 1-2 minutes. Continue to recommend CIR level rehabilitation post-acute D/C due to decreased safety with ADL due to cognitive and balance deficits. OT will continue to follow acutely.   Follow Up Recommendations  CIR;Supervision/Assistance - 24 hour    Equipment Recommendations  Other (comment) (TBD)    Recommendations for Other Services Rehab consult    Precautions / Restrictions Precautions Precautions: Fall Precaution Comments: L visual field cut Restrictions Weight Bearing Restrictions: No       Mobility Bed Mobility               General bed mobility comments: OOB in chair on OT arrival.  Transfers Overall transfer level: Needs assistance Equipment used: None Transfers: Sit to/from Stand Sit to Stand: Min guard          General transfer comment: Pt required multiple cues to stand from recliner with min guard assist. He braces his legs on the bed when attempting to stand.    Balance Overall balance assessment: Needs assistance Sitting-balance support: No upper extremity supported;Feet supported Sitting balance-Leahy Scale: Good     Standing balance support: No upper extremity supported;During functional activity Standing balance-Leahy Scale: Good                     ADL Overall ADL's : Needs assistance/impaired     Grooming: Minimal assistance;Standing Grooming Details (indicate cue type and reason): Min assist to sequence activities and follow through.                 Toilet Transfer: Min guard;Ambulation;BSC Toilet Transfer Details (indicate cue type and reason): Requires VC's to continue with ambulation and is unable to follow one-step commands or multitask during functional mobility.         Functional mobility during ADLs: Min guard General ADL Comments: Pt requires maximum verbal cues to sequence and problem solve during activities. Pt does not have awareness of position of L UE in place.       Vision                 Additional Comments: L visual field deficit remains which is impacting safety with functional mobility as pt unable to navigate environment safely. He requires verbal cues to complete compensatory strategies to turn head from L to R in order to scan environment.   Perception     Praxis      Cognition   Behavior During Therapy: Restless Overall Cognitive  Status: Impaired/Different from baseline Area of Impairment: Orientation;Attention;Awareness;Problem solving;Safety/judgement;Memory Orientation Level: Disoriented to;Situation Current Attention Level: Selective Memory: Decreased short-term memory  Following Commands: Follows one step commands inconsistently Safety/Judgement: Decreased awareness of safety;Decreased awareness of  deficits Awareness: Intellectual Problem Solving: Slow processing;Difficulty sequencing;Requires verbal cues;Requires tactile cues General Comments: Pt unable to answer cell phone despite verbal cueing. He acknowledges commands but does not follow through on instructions without further cueing. Pt is not aware of his deficits or situation. He repeatedly asked therapist "am I supposed to go home now?" and perseverating on his telemetry monitor.      Exercises     Shoulder Instructions       General Comments      Pertinent Vitals/ Pain       Pain Assessment: No/denies pain  Home Living                                          Prior Functioning/Environment              Frequency  Min 3X/week        Progress Toward Goals  OT Goals(current goals can now be found in the care plan section)  Progress towards OT goals: Progressing toward goals  Acute Rehab OT Goals Patient Stated Goal: To return to independent OT Goal Formulation: With patient Time For Goal Achievement: 07/18/16 Potential to Achieve Goals: Good ADL Goals Pt Will Perform Eating:  (with L hand) Pt Will Transfer to Toilet: with modified independence;ambulating;regular height toilet Pt Will Perform Tub/Shower Transfer: Tub transfer;with modified independence;ambulating;shower seat Additional ADL Goal #1: Pt will demonstrate anticipatory awareness in a minimally distracting environment during standing ADL. Additional ADL Goal #2: Pt will follow 3/4 one-step ADL commands with no verbal cues in order to improve safety with ADL.  Plan Discharge plan remains appropriate    Co-evaluation                 End of Session Equipment Utilized During Treatment: Gait belt  OT Visit Diagnosis: Apraxia (R48.2)   Activity Tolerance Patient tolerated treatment well   Patient Left in chair;with call bell/phone within reach;with chair alarm set   Nurse Communication Mobility status         Time: [id number] OT Time Calculation (min): 39 min  Charges: OT General Charges $OT Visit: 1 Procedure OT Treatments $Self Care/Home Management : 23-37 mins $Therapeutic Activity: 8-22 mins  Norman Herrlich, MS OTR/L  Pager: [place] A Nicholai Willette 07/13/2016, 5:19 PM

## 2016-07-13 NOTE — Progress Notes (Signed)
Rehab admissions - I met with patient yesterday afternoon.  I called his friend, Leda Quail, and spoke with her this am.  I have called and faxed information to [place] seeking inpatient rehab approval.  I hope to hear back from insurance case manager later today.  Call me for questions.  #[phone]

## 2016-07-13 NOTE — Progress Notes (Signed)
ANTICOAGULATION CONSULT NOTE - Initial Consult  Pharmacy Consult for xarelto Indication: atrial fibrillation  Allergies  Allergen Reactions  . Ciprofloxacin Other (See Comments)    Causes headaches  . Fish-Derived Products Other (See Comments)    Mercury content poisoned his body  . Kale Other (See Comments)    Caused toxicity in the body  . Latex Itching  . Milk-Related Compounds Other (See Comments)    Increases phlegm    Patient Measurements: Height: 5\' 11"  (180.3 cm) Weight: 199 lb (90.3 kg) IBW/kg (Calculated) : 75.3  Vital Signs: Temp: 97.5 F (36.4 C) (02/22 0912) Temp Source: Axillary (02/22 0912) BP: 142/95 (02/22 0912) Pulse Rate: 50 (02/22 0912)  Labs:  Recent Labs  07/10/16 1820 07/10/16 1843 07/12/16 0258  HGB 15.2 15.0 14.3  HCT 45.1 44.0 42.8  PLT 193  --  194  APTT 29  --   --   LABPROT 13.6  --   --   INR 1.04  --   --   CREATININE 0.90 0.90 1.07    Estimated Creatinine Clearance: 65.5 mL/min (by C-G formula based on SCr of 1.07 mg/dL).   Medical History: Past Medical History:  Diagnosis Date  . Chronic left shoulder pain   . Hypertension   . Kidney stones   . Lead poisoning   . Nephrolithiasis   . Tremor    from lead poisoning    Medications:  Prescriptions Prior to Admission  Medication Sig Dispense Refill Last Dose  . albuterol (PROVENTIL) (2.5 MG/3ML) 0.083% nebulizer solution Take 2.5 mg by nebulization every 6 (six) hours as needed for wheezing or shortness of breath.   Past Month at Unknown time  . aspirin EC 81 MG tablet Take 81 mg by mouth daily.   07/10/2016 at am  . Ethylenediamine 99 % LIQD Inject into the vein once a week. EDTA Ca 300 mg/ml and infuses 50 mg/kg weekly   Unk at Unk  . hydrochlorothiazide (MICROZIDE) 12.5 MG capsule Take 12.5 mg by mouth daily as needed (for blood pressure).    PRN at PRN  . 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 NON FORMULARY Vitamin C powder mixed with  sodium bicarb: Drink daily as directed   Past Week at Unknown time  . OVER THE COUNTER MEDICATION Hemp oil: One squirt under the tongue twice a day to treat tremors   Past Month at Unknown time  . spironolactone (ALDACTONE) 25 MG tablet Take 12.5 mg by mouth daily.   07/09/2016 at pm  . SUCCIMER PO As directed/Treats lead poisoning   PRN at PRN  . telmisartan (MICARDIS) 40 MG tablet Take 40 mg by mouth daily as needed (for blood pressure).    07/09/2016 at Unknown time  . UNABLE TO FIND Use LDA sublingually starting every 8 weeks/ Mix to be MX/IC/chemical 0.04 mg each with 0.01 mg enzyme each   Past Month at Unknown time  . nitroGLYCERIN (NITRODUR - DOSED IN MG/24 HR) 0.2 mg/hr 1/4 patch q24hr (Patient not taking: Reported on 07/10/2016) 30 patch 11 Not Taking at Unknown time   Scheduled:  . atorvastatin  20 mg Oral q1800  . folic acid  1 mg Intravenous Daily  . rivaroxaban  20 mg Oral Q supper  . thiamine injection  100 mg Intravenous Daily   Infusions:    Assessment: 76yo male with history of HTN and lead poisoning. Pharmacy is consulted to dose xarelto for atrial  fibrillation in setting of stroke.   Goal of Therapy:  Monitor platelets by anticoagulation protocol: Yes   Plan:  Xarelto 20mg  daily Monitor s/sx of bleeding Educate patient on xarelto  Andrey Cota. Diona Foley, PharmD, BCPS Clinical Pharmacist [phone] 07/13/2016,11:01 AM

## 2016-07-13 NOTE — H&P (Deleted)
  The note originally documented on this encounter has been moved the the encounter in which it belongs.  
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
## 2016-07-13 NOTE — Progress Notes (Signed)
Rehab admissions - I have left messages with the case managers at [facility] regarding inpatient rehab admission.  I have not heard back from the insurance carrier.  I will follow up with all once I hear back from insurance case manager.  Call me for questions.  CK:[phone]
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
## 2016-07-14 ENCOUNTER — Inpatient Hospital Stay (HOSPITAL_COMMUNITY)
Admission: RE | Admit: 2016-07-14 | Discharge: 2016-07-22 | DRG: 092 | Disposition: A | Discharge status: Home / Self Care | Payer: Medicare Other | Source: Intra-hospital | Attending: Physical Medicine & Rehabilitation | Admitting: Physical Medicine & Rehabilitation

## 2016-07-14 DIAGNOSIS — Z7982 Long term (current) use of aspirin: Secondary | ICD-10-CM

## 2016-07-14 DIAGNOSIS — Z91013 Allergy to seafood: Secondary | ICD-10-CM | POA: Diagnosis not present

## 2016-07-14 DIAGNOSIS — G8929 Other chronic pain: Secondary | ICD-10-CM | POA: Diagnosis present

## 2016-07-14 DIAGNOSIS — Z881 Allergy status to other antibiotic agents status: Secondary | ICD-10-CM

## 2016-07-14 DIAGNOSIS — Z7901 Long term (current) use of anticoagulants: Secondary | ICD-10-CM | POA: Diagnosis not present

## 2016-07-14 DIAGNOSIS — E785 Hyperlipidemia, unspecified: Secondary | ICD-10-CM | POA: Diagnosis present

## 2016-07-14 DIAGNOSIS — I10 Essential (primary) hypertension: Secondary | ICD-10-CM | POA: Diagnosis present

## 2016-07-14 DIAGNOSIS — Z9181 History of falling: Secondary | ICD-10-CM | POA: Diagnosis not present

## 2016-07-14 DIAGNOSIS — R2689 Other abnormalities of gait and mobility: Principal | ICD-10-CM | POA: Diagnosis present

## 2016-07-14 DIAGNOSIS — Z8249 Family history of ischemic heart disease and other diseases of the circulatory system: Secondary | ICD-10-CM | POA: Diagnosis not present

## 2016-07-14 DIAGNOSIS — R414 Neurologic neglect syndrome: Secondary | ICD-10-CM | POA: Diagnosis present

## 2016-07-14 DIAGNOSIS — Z79899 Other long term (current) drug therapy: Secondary | ICD-10-CM | POA: Diagnosis not present

## 2016-07-14 DIAGNOSIS — Z91040 Latex allergy status: Secondary | ICD-10-CM

## 2016-07-14 DIAGNOSIS — G20 Parkinson's disease: Secondary | ICD-10-CM | POA: Diagnosis present

## 2016-07-14 DIAGNOSIS — K5900 Constipation, unspecified: Secondary | ICD-10-CM | POA: Diagnosis present

## 2016-07-14 DIAGNOSIS — I63511 Cerebral infarction due to unspecified occlusion or stenosis of right middle cerebral artery: Secondary | ICD-10-CM | POA: Diagnosis present

## 2016-07-14 DIAGNOSIS — Z91011 Allergy to milk products: Secondary | ICD-10-CM | POA: Diagnosis not present

## 2016-07-14 DIAGNOSIS — M25512 Pain in left shoulder: Secondary | ICD-10-CM | POA: Diagnosis present

## 2016-07-14 DIAGNOSIS — I4891 Unspecified atrial fibrillation: Secondary | ICD-10-CM | POA: Diagnosis present

## 2016-07-14 DIAGNOSIS — Z888 Allergy status to other drugs, medicaments and biological substances status: Secondary | ICD-10-CM

## 2016-07-14 DIAGNOSIS — G20A1 Parkinson's disease without dyskinesia, without mention of fluctuations: Secondary | ICD-10-CM | POA: Diagnosis present

## 2016-07-14 DIAGNOSIS — R4189 Other symptoms and signs involving cognitive functions and awareness: Secondary | ICD-10-CM | POA: Diagnosis present

## 2016-07-14 DIAGNOSIS — I480 Paroxysmal atrial fibrillation: Secondary | ICD-10-CM

## 2016-07-14 DIAGNOSIS — I169 Hypertensive crisis, unspecified: Secondary | ICD-10-CM

## 2016-07-14 LAB — POTASSIUM: POTASSIUM: 4.2 mmol/L (ref 3.5–5.1)

## 2016-07-14 MED ORDER — ONDANSETRON HCL 4 MG PO TABS: 4.0000 mg | ORAL_TABLET | Freq: Four times a day (QID) | ORAL | Status: DC | PRN

## 2016-07-14 MED ORDER — MELATONIN 3 MG PO TABS
3.0000 mg | ORAL_TABLET | Freq: Every day | ORAL | Status: DC
  Administered 2016-07-14 – 2016-07-18 (×5): 3 mg via ORAL
  Filled 2016-07-14 (×7): qty 1

## 2016-07-14 MED ORDER — ONDANSETRON HCL 4 MG/2ML IJ SOLN: 4.0000 mg | Freq: Four times a day (QID) | INTRAMUSCULAR | Status: DC | PRN

## 2016-07-14 MED ORDER — ACETAMINOPHEN 160 MG/5ML PO SOLN: 650.0000 mg | ORAL | Status: DC | PRN

## 2016-07-14 MED ORDER — ACETAMINOPHEN 325 MG PO TABS
650.0000 mg | ORAL_TABLET | ORAL | Status: DC | PRN
  Administered 2016-07-14 – 2016-07-15 (×3): 650 mg via ORAL
  Filled 2016-07-14 (×3): qty 2

## 2016-07-14 MED ORDER — FOLIC ACID 1 MG PO TABS: 1.0000 mg | ORAL_TABLET | Freq: Every day | ORAL | Status: DC

## 2016-07-14 MED ORDER — ACETAMINOPHEN 650 MG RE SUPP
650.0000 mg | RECTAL | Status: DC | PRN
  Filled 2016-07-14: qty 1

## 2016-07-14 MED ORDER — ATORVASTATIN CALCIUM 20 MG PO TABS
20.0000 mg | ORAL_TABLET | Freq: Every day | ORAL | Status: DC
  Administered 2016-07-15 – 2016-07-21 (×7): 20 mg via ORAL
  Filled 2016-07-14 (×7): qty 1

## 2016-07-14 MED ORDER — SORBITOL 70 % SOLN
30.0000 mL | Freq: Every day | Status: DC | PRN
  Administered 2016-07-18 – 2016-07-21 (×3): 30 mL via ORAL
  Filled 2016-07-14 (×3): qty 30

## 2016-07-14 MED ORDER — VITAMIN B-1 100 MG PO TABS: 100.0000 mg | ORAL_TABLET | Freq: Every day | ORAL | Status: DC

## 2016-07-14 MED ORDER — FOLIC ACID 1 MG PO TABS
1.0000 mg | ORAL_TABLET | Freq: Every day | ORAL | Status: DC
  Administered 2016-07-14 – 2016-07-22 (×9): 1 mg via ORAL
  Filled 2016-07-14 (×9): qty 1
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
## 2016-07-14 NOTE — Care Management Important Message (Signed)
Important Message  Patient Details  Name: Eric Pope MRN: [id number] Date of Birth: 1940-11-26   Medicare Important Message Given:  Yes    Nathen May 07/14/2016, 2:07 PM

## 2016-07-14 NOTE — Discharge Instructions (Signed)
Follow with Primary MD BLOOMFIELD,ROBERT, MD in 7 days   Get CBC, CMP, 2 view Chest X ray checked  by Primary MD or SNF MD in 5-7 days ( we routinely change or add medications that can affect your baseline labs and fluid status, therefore we recommend that you get the mentioned basic workup next visit with your PCP, your PCP may decide not to get them or add new tests based on their clinical decision)  Activity: As tolerated with Full fall precautions use walker/cane & assistance as needed  Disposition CIR/SNF  Diet: Heart Healthy    For Heart failure patients - Check your Weight same time everyday, if you gain over 2 pounds, or you develop in leg swelling, experience more shortness of breath or chest pain, call your Primary MD immediately. Follow Cardiac Low Salt Diet and 1.5 lit/day fluid restriction.  On your next visit with your primary care physician please Get Medicines reviewed and adjusted.  Please request your Prim.MD to go over all Hospital Tests and Procedure/Radiological results at the follow up, please get all Hospital records sent to your Prim MD by signing hospital release before you go home.  If you experience worsening of your admission symptoms, develop shortness of breath, life threatening emergency, suicidal or homicidal thoughts you must seek medical attention immediately by calling 911 or calling your MD immediately  if symptoms less severe.  You Must read complete instructions/literature along with all the possible adverse reactions/side effects for all the Medicines you take and that have been prescribed to you. Take any new Medicines after you have completely understood and accpet all the possible adverse reactions/side effects.   Do not drive, operate heavy machinery, perform activities at heights, swimming or participation in water activities or provide baby sitting services if your were admitted for syncope or siezures until you have seen by Primary MD or a Neurologist  and advised to do so again.  Do not drive when taking Pain medications.    Do not take more than prescribed Pain, Sleep and Anxiety Medications  Special Instructions: If you have smoked or chewed Tobacco  in the last 2 yrs please stop smoking, stop any regular Alcohol  and or any Recreational drug use.  Wear Seat belts while driving.   Please note  You were cared for by a hospitalist during your hospital stay. If you have any questions about your discharge medications or the care you received while you were in the hospital after you are discharged, you can call the unit and asked to speak with the hospitalist on call if the hospitalist that took care of you is not available. Once you are discharged, your primary care physician will handle any further medical issues. Please note that NO REFILLS for any discharge medications will be authorized once you are discharged, as it is imperative that you return to your primary care physician (or establish a relationship with a primary care physician if you do not have one) for your aftercare needs so that they can reassess your need for medications and monitor your lab values.  Information on my medicine - XARELTO (Rivaroxaban)  This medication education was reviewed with me or my healthcare representative as part of my discharge preparation.    Why was Xarelto prescribed for you? Xarelto was prescribed for you to reduce the risk of a blood clot forming that can cause a stroke if you have a medical condition called atrial fibrillation (a type of irregular heartbeat).  What do  you need to know about xarelto ? Take your Xarelto ONCE DAILY at the same time every day with your evening meal. If you have difficulty swallowing the tablet whole, you may crush it and mix in applesauce just prior to taking your dose.  Take Xarelto exactly as prescribed by your doctor and DO NOT stop taking Xarelto without talking to the doctor who prescribed the  medication.  Stopping without other stroke prevention medication to take the place of Xarelto may increase your risk of developing a clot that causes a stroke.  Refill your prescription before you run out.  After discharge, you should have regular check-up appointments with your healthcare provider that is prescribing your Xarelto.  In the future your dose may need to be changed if your kidney function or weight changes by a significant amount.  What do you do if you miss a dose? If you are taking Xarelto ONCE DAILY and you miss a dose, take it as soon as you remember on the same day then continue your regularly scheduled once daily regimen the next day. Do not take two doses of Xarelto at the same time or on the same day.   Important Safety Information A possible side effect of Xarelto is bleeding. You should call your healthcare provider right away if you experience any of the following: ? Bleeding from an injury or your nose that does not stop. ? Unusual colored urine (red or dark brown) or unusual colored stools (red or black). ? Unusual bruising for unknown reasons. ? A serious fall or if you hit your head (even if there is no bleeding).  Some medicines may interact with Xarelto and might increase your risk of bleeding while on Xarelto. To help avoid this, consult your healthcare provider or pharmacist prior to using any new prescription or non-prescription medications, including herbals, vitamins, non-steroidal anti-inflammatory drugs (NSAIDs) and supplements.  This website has more information on Xarelto: [email or web address]-[email or web address].[email or web address]

## 2016-07-14 NOTE — Care Management Note (Signed)
Case Management Note  Patient Details  Name: LYNWOOD SNOUFFER MRN: [id number] Date of Birth: 1940/07/26  Subjective/Objective:                    Action/Plan: Pt discharging to CIR today. No further needs per CM.  Expected Discharge Date:  07/13/16               Expected Discharge Plan:  [place]  In-House Referral:     Discharge planning Services     Post Acute Care Choice:    Choice offered to:     DME Arranged:    DME Agency:     HH Arranged:    HH Agency:     Status of Service:  Completed, signed off  If discussed at [employer], dates discussed:    Additional Comments:  Pollie Friar, RN 07/14/2016, 10:22 AM

## 2016-07-14 NOTE — Clinical Social Work Note (Signed)
Clinical Social Work Assessment  Patient Details  Name: Eric Pope MRN: [id number] Date of Birth: [birth year]  Date of referral:  07/12/16               Reason for consult:  Discharge Planning                Permission sought to share information with:  Other, Customer service manager (Friend) Permission granted to share information::  Yes, Verbal Permission Granted  Name::     Kennis Carina  Agency::     Relationship::  Friend  Contact Information:  [phone]  Housing/Transportation Living arrangements for the past 2 months:  Apartment Source of Information:  Patient Patient Interpreter Needed:  None Criminal Activity/Legal Involvement Pertinent to Current Situation/Hospitalization:  No - Comment as needed Significant Relationships:  Friend Lives with:  Self Do you feel safe going back to the place where you live?  Yes Need for family participation in patient care:  No (Coment)  Care giving concerns:  No caregiving concerns.    Social Worker assessment / plan:  CSW was consulted for SNF back-up to CIR. CSW met with pt on 2/21 at bedside. CSW introduced self and explained responsibilities. Pt in agreement with SNF backup. CSW provided SNF listing. Pt lives alone but stated he has discussed with his friend, Leda Quail, about moving back in with pt. CSW faxed out FL-2 to [facility].   CIR approved admission for pt and obtained insurance authorization. CSW signing off as no further needs identified.   Employment status:  Retired Forensic scientist:   ([employer] PT Recommendations:  [place] / Referral to community resources:  [place]  Patient/Family's Response to care:  Pt was appreciative of CSW support.   Patient/Family's Understanding of and Emotional Response to Diagnosis, Current Treatment, and Prognosis:  Pt understands that he will benefit from STR before going home.    Emotional  Assessment Appearance:  Appears stated age Attitude/Demeanor/Rapport:   (Appropriate) Affect (typically observed):  Accepting, Adaptable, Pleasant Orientation:  Oriented to Self, Oriented to Place, Oriented to  Time, Oriented to Situation Alcohol / Substance use:  Other Psych involvement (Current and /or in the community):  No (Comment)  Discharge Needs  Concerns to be addressed:  Care Coordination Readmission within the last 30 days:  No Current discharge risk:  Dependent with Mobility Barriers to Discharge:  Continued Medical Work up   [employer], LCSW 07/14/2016, 12:16 PM

## 2016-07-14 NOTE — Progress Notes (Signed)
Rehab admissions - I have approval from [facility] for acute inpatient rehab admission for 07/14/16.  I spoke with patient and he is agreeable.  Patient has been cleared by attending MD for admission to rehab.  Bed available and will admit to acute inpatient rehab today.  Call me for questions.  [phone]

## 2016-07-14 NOTE — Progress Notes (Signed)
Received pt. As a transfer from 5 C. Pt. And friend were oriented to the unit protocol.Safety plan was explained.Fall prevention plan was explained and signed.

## 2016-07-15 ENCOUNTER — Inpatient Hospital Stay (HOSPITAL_COMMUNITY): Payer: Medicare Other

## 2016-07-15 ENCOUNTER — Inpatient Hospital Stay (HOSPITAL_COMMUNITY): Payer: Medicare Other | Admitting: Speech Pathology

## 2016-07-15 ENCOUNTER — Inpatient Hospital Stay (HOSPITAL_COMMUNITY): Payer: Medicare Other | Admitting: Occupational Therapy

## 2016-07-15 MED ORDER — RIVAROXABAN 20 MG PO TABS
20.0000 mg | ORAL_TABLET | Freq: Every day | ORAL | Status: DC
  Administered 2016-07-15 – 2016-07-21 (×7): 20 mg via ORAL
  Filled 2016-07-15 (×7): qty 1
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
## 2016-07-15 NOTE — Progress Notes (Signed)
Subjective: Patient has no complaints. He is feeling well. He is eager to start therapy.  Objective:BP 114/73 (BP Location: Left Arm)   Pulse (!) 51   Temp 98.1 F (36.7 C) (Oral)   Resp 17   Ht 5\' 8"  (1.727 m)   Wt 185 lb 3.2 oz (84 kg)   SpO2 100%   BMI 28.16 kg/m   Patient appears younger than his stated age of 60. HEENT exam: Atraumatic, normocephalic, neck is supple without lymphadenopathy or thyromegaly. Chest clear to auscultation without increased work of breathing Cardiac exam S1 and S2 are irregular Abdomen soft, nontender Extremities no edema Neuro: Parkinsonian tremor of fingers and toes.  A/p  Medical Problem List and Plan: 1. Left visual fields deficits,left neglect and hemisensory deficitssecondary to Right MCA infarctfelt to be embolic -admit to inpatient rehab 2. DVT Prophylaxis/Anticoagulation: Xarelto initiated 07/13/2016 -pt is a fall risk  3. Pain Management: Tylenol as needed 4. Mood: Provide emotional support 5. Neuropsych: This patient iscapable of making decisions on herown behalf. 6. Skin/Wound Care: Routine skin checks 7. Fluids/Electrolytes/Nutrition: Basic Metabolic Panel:    Component Value Date/Time   NA 139 07/12/2016 0258   K 4.2 07/14/2016 0251   CL 107 07/12/2016 0258   CO2 24 07/12/2016 0258   BUN 23 (H) 07/12/2016 0258   CREATININE 1.07 07/12/2016 0258   GLUCOSE 94 07/12/2016 0258   CALCIUM 9.2 07/12/2016 0258    8. Hypertension.No present anti-hypertensive meds .Patient on HCTZ 12.5mg  QD,aldactone 12.5 mg daily PTA .Monitor with increased mobility 114/73-175/105 Watch BP today. I would favor ACE-I over diuretics mentioned. (HCTZ has very little role in elderly) 9.Atrial fibrillation. Cardiac rate controlled. Continue Xarelto 10.Resting tremor/parkinsonian. No treatment 11.Constipation. resolved 12.Hyperlipidemia. Lipitor
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
## 2016-07-16 ENCOUNTER — Inpatient Hospital Stay (HOSPITAL_COMMUNITY): Payer: Medicare Other

## 2016-07-16 MED ORDER — IRBESARTAN 75 MG PO TABS
75.0000 mg | ORAL_TABLET | Freq: Every day | ORAL | Status: DC
  Administered 2016-07-16 – 2016-07-22 (×7): 75 mg via ORAL
  Filled 2016-07-16 (×7): qty 1

## 2016-07-16 NOTE — Progress Notes (Addendum)
Subjective: Patient has no complaints. He is feeling well. Therapy went well yesterday.  We had a long discussion regarding parkinsonism, history of stroke, atrial fibrillation and lead toxicity. He would like to explain all of his medical problems based on lead toxicity.  Objective:BP (!) 163/96 (BP Location: Left Arm)   Pulse (!) 54   Temp 98.1 F (36.7 C) (Oral)   Resp 18   Ht 5\' 8"  (1.727 m)   Wt 185 lb 3.2 oz (84 kg)   SpO2 99%   BMI 28.16 kg/m   Patient appears younger than his stated age of 76. HEENT exam: Atraumatic, normocephalic, neck is supple without lymphadenopathy or thyromegaly. Chest clear to auscultation without increased work of breathing Cardiac exam S1 and S2 are irregular Abdomen soft, nontender Extremities no edema Neuro: Parkinsonian tremor of fingers and toes. Rigidity and cogwheeling of upper and lower extremities.  A/p  Medical Problem List and Plan: 1. Left visual fields deficits,left neglect and hemisensory deficitssecondary to Right MCA infarctfelt to be embolic -admit to inpatient rehab 2. DVT Prophylaxis/Anticoagulation: Xarelto initiated 07/13/2016 -pt is a fall risk  3. Pain Management: Tylenol as needed 4. Mood: Provide emotional support 5. Neuropsych: This patient iscapable of making decisions on herown behalf. 6. Skin/Wound Care: Routine skin checks 7. Fluids/Electrolytes/Nutrition: Basic Metabolic Panel:    Component Value Date/Time   NA 139 07/12/2016 0258   K 4.2 07/14/2016 0251   CL 107 07/12/2016 0258   CO2 24 07/12/2016 0258   BUN 23 (H) 07/12/2016 0258   CREATININE 1.07 07/12/2016 0258   GLUCOSE 94 07/12/2016 0258   CALCIUM 9.2 07/12/2016 0258    8. Hypertension.No present anti-hypertensive meds .Patient on HCTZ 12.5mg  QD,aldactone 12.5 mg daily PTA .Monitor with increased mobility 137/100-163/96 His blood pressure is too high. Prior to admission he states that he was on Micardis. I will start  an ARB. Will not resume hydrochlorothiazide (unclear if he was really taking it PTA) as it is likely not that useful in a 76 year old male. 9.Atrial fibrillation. Cardiac rate controlled. Continue Xarelto 10.Parkinsons Dzs. No treatment. Patient has made this choice previously. He may be willing to consider Sinemet. Given his recent stroke I think treatment of Parkinson's disease would be the right choice for him. 11.Constipation. resolved 12.Hyperlipidemia. Lipitor

## 2016-07-16 NOTE — Progress Notes (Signed)
Occupational Therapy Note  Patient Details  Name: Eric Pope MRN: [id number] Date of Birth: 11-27-1940  Today's Date: 07/16/2016 OT Individual Time: 1345-1415 OT Individual Time Calculation (min): 30 min   Pt denied pain Individual Therapy  Pt resting in recliner upon arrival and agreeable to therapy.  Pt amb without AD to tub room and practiced stepping in/out of tub X 2 with use of grab bars.  Pt states he has grab bars at home. Pt required mod verbal cues to attend to his left and turn his head left during when challenged with locating items on left (doorways, etc.). Pt engaged in task in hallway requiring locating numbered discs in sequence.  Pt required min verbal cues to locate items on left.  Pt was able to locate room at end of session and remained in recliner with all needs within reach.     Leotis Shames [facility] 07/16/2016, 2:17 PM

## 2016-07-17 ENCOUNTER — Inpatient Hospital Stay (HOSPITAL_COMMUNITY): Payer: Medicare Other

## 2016-07-17 ENCOUNTER — Inpatient Hospital Stay (HOSPITAL_COMMUNITY): Payer: Medicare Other | Admitting: Speech Pathology

## 2016-07-17 ENCOUNTER — Inpatient Hospital Stay (HOSPITAL_COMMUNITY): Payer: Medicare Other | Admitting: Physical Therapy

## 2016-07-17 LAB — COMPREHENSIVE METABOLIC PANEL
ALBUMIN: 3.7 g/dL (ref 3.5–5.0)
ALT: 14 U/L — AB (ref 17–63)
AST: 18 U/L (ref 15–41)
Alkaline Phosphatase: 59 U/L (ref 38–126)
Anion gap: 5 (ref 5–15)
BILIRUBIN TOTAL: 0.9 mg/dL (ref 0.3–1.2)
BUN: 16 mg/dL (ref 6–20)
CO2: 28 mmol/L (ref 22–32)
CREATININE: 0.99 mg/dL (ref 0.61–1.24)
Calcium: 9.3 mg/dL (ref 8.9–10.3)
Chloride: 110 mmol/L (ref 101–111)
GFR calc Af Amer: >60 mL/min (ref >60)
GFR calc non Af Amer: >60 mL/min (ref >60)
GLUCOSE: 99 mg/dL (ref 65–99)
Potassium: 4 mmol/L (ref 3.5–5.1)
Sodium: 143 mmol/L (ref 135–145)
TOTAL PROTEIN: 5.4 g/dL — AB (ref 6.5–8.1)

## 2016-07-17 LAB — CBC WITH DIFFERENTIAL/PLATELET
BASOS PCT: 0 %
Basophils Absolute: 0 10*3/uL (ref 0.0–0.1)
Eosinophils Absolute: 0.3 10*3/uL (ref 0.0–0.7)
Eosinophils Relative: 5 %
HEMATOCRIT: 40 % (ref 39.0–52.0)
HEMOGLOBIN: 13.5 g/dL (ref 13.0–17.0)
Lymphocytes Relative: 25 %
Lymphs Abs: 1.5 10*3/uL (ref 0.7–4.0)
MCH: 30.3 pg (ref 26.0–34.0)
MCHC: 33.8 g/dL (ref 30.0–36.0)
MCV: 89.9 fL (ref 78.0–100.0)
MONOS PCT: 8 %
Monocytes Absolute: 0.5 10*3/uL (ref 0.1–1.0)
NEUTROS ABS: 3.7 10*3/uL (ref 1.7–7.7)
NEUTROS PCT: 62 %
Platelets: 168 10*3/uL (ref 150–400)
RBC: 4.45 MIL/uL (ref 4.22–5.81)
RDW: 12.7 % (ref 11.5–15.5)
WBC: 5.9 10*3/uL (ref 4.0–10.5)

## 2016-07-17 NOTE — Care Management Note (Signed)
[facility] Individual Statement of Services  Patient Name:  Eric Pope  Date:  07/17/2016  Welcome to the [facility].  Our goal is to provide you with an individualized program based on your diagnosis and situation, designed to meet your specific needs.  With this comprehensive rehabilitation program, you will be expected to participate in at least 3 hours of rehabilitation therapies Monday-Friday, with modified therapy programming on the weekends.  Your rehabilitation program will include the following services:  Physical Therapy (PT), Occupational Therapy (OT), Speech Therapy (ST), 24 hour per day rehabilitation nursing, Therapeutic Recreaction (TR), Neuropsychology, Case Management (Social Worker), Rehabilitation Medicine, Nutrition Services and Pharmacy Services  Weekly team conferences will be held on Wednesday to discuss your progress.  Your Social Worker will talk with you frequently to get your input and to update you on team discussions.  Team conferences with you and your family in attendance may also be held.  Expected length of stay: 7 days Overall anticipated outcome: supervision with cues  Depending on your progress and recovery, your program may change. Your Social Worker will coordinate services and will keep you informed of any changes. Your Social Worker's name and contact numbers are listed  below.  The following services may also be recommended but are not provided by the [facility] will be made to provide these services after discharge if needed.  Arrangements include referral to agencies that provide these services.  Your insurance has been verified to be:  [employer] Your primary doctor is:  Vladimir Creeks  Pertinent information will be shared with your  doctor and your insurance company.  Social Worker:  Ovidio Kin, [place] or [phone]  Information discussed with and copy given to patient by: Elease Hashimoto, 07/17/2016, 2:00 PM

## 2016-07-17 NOTE — Progress Notes (Signed)
Patient information reviewed and entered into eRehab system by Sigfredo Schreier, RN, CRRN, PPS Coordinator.  Information including medical coding and functional independence measure will be reviewed and updated through discharge.     Per nursing patient was given "Data Collection Information Summary for Patients in Inpatient Rehabilitation Facilities with attached "Privacy Act Statement-Health Care Records" upon admission.  

## 2016-07-17 NOTE — Progress Notes (Signed)
At around 5 am, patient BP was 145/115, when rechecked it came down to 185/105, patient was asymptomatics. PA Dan notified but no new order was place.  We continue to monitor.
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
## 2016-07-17 NOTE — Progress Notes (Signed)
Speech Language Pathology Daily Session Note  Patient Details  Name: Eric Pope MRN: [id number] Date of Birth: [birth year]  Today's Date: 07/17/2016 SLP Individual Time: [id number] SLP Individual Time Calculation (min): 45 min  Short Term Goals: Week 1: SLP Short Term Goal 1 (Week 1): Pt will sustain attention to functional tasks for ~ 10 minutes with Mod A verbal cues.  SLP Short Term Goal 2 (Week 1): Pt will solve basic familiar problems with Mod A verbal cues.  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  SLP Short Term Goal 4 (Week 1): Pt will utilize compensatory memory strategies to recall new information with Mod A verbal cues.   Skilled Therapeutic Interventions: Skilled treatment session focused on addressing cognition goals. SLP facilitated session by providing get to know you discussion regarding events of hospitalization and course of stay.  Patient stated that he had a stroke and was able to identify two physical changes.  He required Max assist question cues to identify 1 cognitive deficit.  Patient with verbal perseveration on events of last night, lead poisoning, and healthy food.  Patient required Max assist multimodal cues for redirection to newspaper reading task after 2-3 minutes.  Task was was addressing left attention and patient required initial Mod cues faded to Min throughout task with increased wait time.  Continue with current plan of care.    Function:  Cognition Comprehension Comprehension assist level: Follows basic conversation/direction with no assist  Expression   Expression assist level: Expresses basic needs/ideas: With no assist  Social Interaction Social Interaction assist level: Interacts appropriately with others with medication or extra time (anti-anxiety, antidepressant).  Problem Solving Problem solving assist level: Solves basic 50 - 74% of the  time/requires cueing 25 - 49% of the time  Memory Memory assist level: Recognizes or recalls 50 - 74% of the time/requires cueing 25 - 49% of the time    Pain Pain Assessment Pain Assessment: No/denies pain  Therapy/Group: Individual Therapy  Carmelia Roller., CCC-SLP [id number]  [place] 07/17/2016, 5:22 PM
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
## 2016-07-17 NOTE — Progress Notes (Signed)
Physical Therapy Session Note  Patient Details  Name: Eric Pope MRN: [id number] Date of Birth: 1940/06/20  Today's Date: 07/17/2016 PT Individual Time: 0800-0830 PT Individual Time Calculation (min): 30 min   Short Term Goals: Week 1:  PT Short Term Goal 1 (Week 1): = LTGs due to LOs  Skilled Therapeutic Interventions/Progress Updates: Pt presented in chair agreeable to therapy. Pt required mod cues for re-direction as pt peserverating on the feeling that he's in an institution, and that his equilibrium is off. Gait 250 ft with no AD, cues for increasing foot clearance and erect posture. Decreased step length noted with gait. Performed standing static balance activities on compliant and non-compliant surface with forced use of BUE. Required min cues for maintaining attention to task. Pt returned to room and remained in recliner chair with current needs met.      Therapy Documentation Precautions:  Precautions Precautions: Fall Precaution Comments: L visual field cut Restrictions Weight Bearing Restrictions: No   See Function Navigator for Current Functional Status.   Therapy/Group: Individual Therapy  [name]  Tasheka Houseman, PTA  07/17/2016, 12:14 PM

## 2016-07-17 NOTE — Progress Notes (Signed)
Physical Therapy Note  Patient Details  Name: Eric Pope MRN: [id number] Date of Birth: Aug 24, 1940 Today's Date: 07/17/2016  1440-1530, 50 min; missed 10 min due to pt on telephone talking business  Pain: no c/o  Gait to gym with dual task of reading off room numbers on L, with min cues for immediate L.  Patient demonstrates increased fall risk as noted by score of   33/56 on Berg Balance Scale.  (<36= high risk for falls, close to 100%; 37-45 significant >80%; 46-51 moderate >50%; 52-55 lower >25%).  Falls risk discussed with pt.  neuromuscular re-education via forced use for W/c propulsion using bil UEs with min assist for steering; w/c cushion added and footrests adjusted.  Balance retraining standing on compliant mat and wedge during ball tossing activity, without LOB.  Falls risk re-iterated regarding pt taking himself to BR. Pt left resting in w/c with quick release belt applied and all needs within reach.  See function navigator for current status.  Eric Pope 07/17/2016, 3:23 PM

## 2016-07-17 NOTE — Progress Notes (Signed)
Occupational Therapy Session Note  Patient Details  Name: Eric Pope MRN: [id number] Date of Birth: July 20, 1940  Today's Date: 07/17/2016 OT Individual Time:  9:00- 10:00      Short Term Goals: Week 1:  OT Short Term Goal 1 (Week 1): STGS=LTGS secondary to short estimated LOS  Skilled Therapeutic Interventions/Progress Updates:   Upon entering session, pt does not complain of paina nd agreeable to tx. Pt reporting feeling distressed and wanting to talk with a psychologist. Notified CSW. OT provided support and said would recommend for neuropsych. Pt ambulates to shower and bathes seated on shower chair with VC to maintain attention to task. Pt required increased time due to perseveration on the idea that he had lead poisoning throughout session. Pt able to gather clothes and dress with VC to orient pants and shirt after attempting to put on backwards. Pt demo L inattention throughout session and required VC to scan to L to retrieve socks and shoes. Pt required significantly increased time to problem solve pulling shoe string out of shoe from under foot with tactile cues and 1 step commands. Pt stands to brush teeth at sink and is left seated in recliner with call light in reach and educated to call nurse prior to getting up.   Therapy Documentation Precautions:  Precautions Precautions: Fall Precaution Comments: L visual field cut Restrictions Weight Bearing Restrictions: No Pain:   ADL:   no pain reported Exercises:   Other Treatments:    See Function Navigator for Current Functional Status.   Therapy/Group: Individual Therapy  [name] 07/17/2016, 10:30 AM

## 2016-07-18 ENCOUNTER — Inpatient Hospital Stay (HOSPITAL_COMMUNITY): Payer: Medicare Other

## 2016-07-18 ENCOUNTER — Inpatient Hospital Stay (HOSPITAL_COMMUNITY): Payer: Medicare Other | Admitting: Occupational Therapy

## 2016-07-18 ENCOUNTER — Inpatient Hospital Stay (HOSPITAL_COMMUNITY): Payer: Medicare Other | Admitting: Physical Therapy

## 2016-07-18 ENCOUNTER — Telehealth: Payer: Self-pay | Admitting: Neurology

## 2016-07-18 ENCOUNTER — Inpatient Hospital Stay (HOSPITAL_COMMUNITY): Payer: Medicare Other | Admitting: Speech Pathology

## 2016-07-18 ENCOUNTER — Encounter (HOSPITAL_COMMUNITY): Payer: Medicare Other | Admitting: Psychology

## 2016-07-18 NOTE — Telephone Encounter (Signed)
Patient calling to speak to Dr. Leonie Man. He states he is currently in rehab at [facility] since having a stroke. He says he seems more confused and wants to discuss with Dr. Leonie Man. Per Katrina the patient was advised to speak to his rehab nurse and if needed to get Dr. Erlinda Hong who is working in the hospital.

## 2016-07-18 NOTE — Telephone Encounter (Signed)
Per Dr.Sethi, any patient in [facility] or their inpatient rehab needs to consult with the rehab md and nurse if he thinks the stroke sympts are back.

## 2016-07-18 NOTE — Progress Notes (Signed)
Subjective/Complaints: Pt feels like he needs to be here now.   Discussed left neglect  ROS  No CP, SOB, palpitation, N/V/D Objective: Vital Signs: Blood pressure (!) 140/98, pulse 100, temperature 98.6 F (37 C), temperature source Oral, resp. rate 20, height _0  (1.727 m), weight 84 kg (185 lb 3.2 oz), SpO2 98 %. No results found. Results for orders placed or performed during the hospital encounter of 07/14/16 (from the past 72 hour(s))  CBC WITH DIFFERENTIAL     Status: None   Collection Time: 07/17/16  3:49 AM  Result Value Ref Range   WBC 5.9 4.0 - 10.5 K/uL   RBC 4.45 4.22 - 5.81 MIL/uL   Hemoglobin 13.5 13.0 - 17.0 g/dL   HCT 40.0 39.0 - 52.0 %   MCV 89.9 78.0 - 100.0 fL   MCH 30.3 26.0 - 34.0 pg   MCHC 33.8 30.0 - 36.0 g/dL   RDW 12.7 11.5 - 15.5 %   Platelets 168 150 - 400 K/uL   Neutrophils Relative % 62 %   Neutro Abs 3.7 1.7 - 7.7 K/uL   Lymphocytes Relative 25 %   Lymphs Abs 1.5 0.7 - 4.0 K/uL   Monocytes Relative 8 %   Monocytes Absolute 0.5 0.1 - 1.0 K/uL   Eosinophils Relative 5 %   Eosinophils Absolute 0.3 0.0 - 0.7 K/uL   Basophils Relative 0 %   Basophils Absolute 0.0 0.0 - 0.1 K/uL  Comprehensive metabolic panel     Status: Abnormal   Collection Time: 07/17/16  3:49 AM  Result Value Ref Range   Sodium 143 135 - 145 mmol/L   Potassium 4.0 3.5 - 5.1 mmol/L   Chloride 110 101 - 111 mmol/L   CO2 28 22 - 32 mmol/L   Glucose, Bld 99 65 - 99 mg/dL   BUN 16 6 - 20 mg/dL   Creatinine, Ser 0.99 0.61 - 1.24 mg/dL   Calcium 9.3 8.9 - 10.3 mg/dL   Total Protein 5.4 (L) 6.5 - 8.1 g/dL   Albumin 3.7 3.5 - 5.0 g/dL   AST 18 15 - 41 U/L   ALT 14 (L) 17 - 63 U/L   Alkaline Phosphatase 59 38 - 126 U/L   Total Bilirubin 0.9 0.3 - 1.2 mg/dL   GFR calc non Af Amer >60 >60 mL/min   GFR calc Af Amer >60 >60 mL/min    Comment: (NOTE) The eGFR has been calculated using the CKD EPI equation. This calculation has not been validated in all clinical  situations. eGFR's persistently <60 mL/min signify possible Chronic Kidney Disease.    Anion gap 5 5 - 15     HEENT: normal Cardio: RRR and no murmur Resp: CTA B/L and unlabored GI: BS positive and NT, ND Extremity:  No Edema Skin:   Intact Neuro: Alert/Oriented, Abnormal Sensory Reduced on L LT, Normal Motor, Abnormal FMC Ataxic/ dec FMC and Inattention Musc/Skel:  Other tremor RUE>LUE Gen NAD, poor awareness of deficit   Assessment/Plan: 1. Functional deficits secondary to RIght MCA infarct which require 3+ hours per day of interdisciplinary therapy in a comprehensive inpatient rehab setting. Physiatrist is providing close team supervision and 24 hour management of active medical problems listed below. Physiatrist and rehab team continue to assess barriers to discharge/monitor patient progress toward functional and medical goals. FIM: Function - Bathing Position: Shower Body parts bathed by patient: Right arm, Left arm, Chest, Abdomen, Front perineal area, Buttocks, Right upper leg, Left upper leg, Right  lower leg, Left lower leg Body parts bathed by helper: Back Assist Level: Supervision or verbal cues  Function- Upper Body Dressing/Undressing What is the patient wearing?: Pull over shirt/dress Assist Level: Supervision or verbal cues Set up : To obtain clothing/put away Function - Lower Body Dressing/Undressing What is the patient wearing?: Underwear, Pants, Socks, Shoes Underwear - Performed by patient: Thread/unthread right underwear leg, Thread/unthread left underwear leg, Pull underwear up/down Pants- Performed by patient: Thread/unthread right pants leg, Thread/unthread left pants leg, Pull pants up/down Non-skid slipper socks- Performed by patient: Don/doff right sock, Don/doff left sock Socks - Performed by patient: Don/doff right sock, Don/doff left sock Shoes - Performed by patient: Don/doff right shoe, Don/doff left shoe, Fasten right, Fasten left Assist for  footwear: Supervision/touching assist Assist for lower body dressing: Touching or steadying assistance (Pt > 75%)  Function - Toileting Toileting activity did not occur: No continent bowel/bladder event Toileting steps completed by patient: Adjust clothing prior to toileting, Performs perineal hygiene, Adjust clothing after toileting Toileting steps completed by helper: Adjust clothing after toileting, Adjust clothing prior to toileting Toileting Assistive Devices: Grab bar or rail Assist level: Supervision or verbal cues  Function - Air cabin crew transfer assistive device: Bedside commode, Grab bar Assist level to toilet: Touching or steadying assistance (Pt > 75%) Assist level from toilet: Touching or steadying assistance (Pt > 75%)  Function - Chair/bed transfer Chair/bed transfer method: Stand pivot Chair/bed transfer assist level: Touching or steadying assistance (Pt > 75%) Chair/bed transfer assistive device: Armrests Chair/bed transfer details: Verbal cues for sequencing, Verbal cues for precautions/safety  Function - Locomotion: Wheelchair Will patient use wheelchair at discharge?: No Function - Locomotion: Ambulation Assistive device: No device Max distance: 250 Assist level: Touching or steadying assistance (Pt > 75%) Assist level: Touching or steadying assistance (Pt > 75%) Assist level: Touching or steadying assistance (Pt > 75%) Assist level: Touching or steadying assistance (Pt > 75%) Assist level: Touching or steadying assistance (Pt > 75%)  Function - Comprehension Comprehension: Auditory Comprehension assist level: Understands basic 90% of the time/cues < 10% of the time  Function - Expression Expression: Verbal Expression assist level: Expresses basic needs/ideas: With no assist  Function - Social Interaction Social Interaction assist level: Interacts appropriately 75 - 89% of the time - Needs redirection for appropriate language or to initiate  interaction.  Function - Problem Solving Problem solving assist level: Solves basic 75 - 89% of the time/requires cueing 10 - 24% of the time  Function - Memory Memory assist level: Recognizes or recalls 75 - 89% of the time/requires cueing 10 - 24% of the time Patient normally able to recall (first 3 days only): Current season, Staff names and faces, That he or she is in a hospital  Medical Problem List and Plan: 1. Left visual fields deficits,left neglect and hemisensory deficitssecondary to Right MCA infarctfelt to be embolic, team conf in am PT, OT, SLP CIR PT, OT, SLP 2. DVT Prophylaxis/Anticoagulation: Xarelto initiated 07/13/2016 -fall risk which will persist after D/C will need Sup 3. Pain Management: Tylenol as needed 4. Mood: Provide emotional support 5. Neuropsych: This patient iscapable of making decisions on herown behalf. 6. Skin/Wound Care: Routine skin checks 7. Fluids/Electrolytes/Nutrition: Routine I&O withfollow-up chemistries upon admit 8. Hypertension.No present anti-hypertensive meds .Patient on HCTZ 12.98m QD,aldactone 12.5 mg daily PTA .Monitor with increased mobility 9.Atrial fibrillation. Cardiac rate controlled. Continue Xarelto 10.Resting tremor/parkinsonian. Patient has refused Sinemet in the past.  -team to address with adaptive equipment/strategies  as appropriate 11.Constipation.Adjust bowel program 12.Hyperlipidemia. Lipitor    13.  

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
## 2016-07-18 NOTE — Progress Notes (Signed)
Occupational Therapy Session Note  Patient Details  Name: Eric Pope MRN: [id number] Date of Birth: 1940/11/08  Today's Date: 07/18/2016 OT Individual Time: 1001-1103 OT Individual Time Calculation (min): 62 min    Short Term Goals: Week 1:  OT Short Term Goal 1 (Week 1): STGS=LTGS secondary to short estimated LOS  Skilled Therapeutic Interventions/Progress Updates:    Pt completed shower and dressing during session.  Min instructional cueing to sequence bathing with min instructional cueing for scanning to the left to locate soap and washcloth.  Pt verbally able to state deficits of left visual field deficit from CVA but still needed cueing to follow.  Once shower was completed he completed dressing sitting from the EOC.  Pt with increased difficulty getting workout pants on secondary to one leg being inside out.  He finally figured out how to re-adjust it and completed all dressing with supervision.  Pt placed in bedside chair at end of session and he was able to state the need to use the call button to get up.  Therapist went out to get him a comb and when he came back in the pt was heading over to the sink without assistance.  He stated "yep I was supposed to use the button, but you were in here".  Therapist explained that he wasn't beside of him though and someone needs to be beside of him when up.  He voiced understanding and transferred back to the bedside recliner with supervision.  Call button and phone in reach.    Therapy Documentation Precautions:  Precautions Precautions: Fall Precaution Comments: L visual field cut Restrictions Weight Bearing Restrictions: No  Pain: Pain Assessment Pain Assessment: No/denies pain ADL: See Function Navigator for Current Functional Status.   Therapy/Group: Individual Therapy  Adalid Beckmann OTR/L 07/18/2016, 1:03 PM

## 2016-07-18 NOTE — Progress Notes (Signed)
Physical Therapy Session Note  Patient Details  Name: Eric Pope MRN: [id number] Date of Birth: Aug 24, 1940  Today's Date: 07/18/2016 PT Individual Time: 0900-0933 PT Individual Time Calculation (min): 33 min   Short Term Goals: Week 1:  PT Short Term Goal 1 (Week 1): = LTGs due to LOs  Skilled Therapeutic Interventions/Progress Updates:    Session focused on cognitive remediation during functional mobility and neuro re-ed for balance re-training,graded movement coordination, postural control re-training during dual task activities. Pt demonstrates impaired divided attention throughout session. Pt overall supervision to steadying assist for balance, transfers, and gait. Pt demonstrates decreased awareness to L visual field and overall decreased awareness with safety during mobility. Engaged in higher level dynamic activities including retro gait, tandem gait, ball toss during gait, quick starts and stops, head turns, obstacle negotiation, and toe taps to cone during gait. Pt demonstrates decreased ability to maintain upright posture during single limb stance with trunk shortening noted.   Therapy Documentation Precautions:  Precautions Precautions: Fall Precaution Comments: L visual field cut Restrictions Weight Bearing Restrictions: No  Pain:  Denies pain.    See Function Navigator for Current Functional Status.   Therapy/Group: Individual Therapy  Canary Brim Ivory Broad, PT, DPT  07/18/2016, 9:43 AM

## 2016-07-18 NOTE — Progress Notes (Signed)
Speech Language Pathology Daily Session Note  Patient Details  Name: Eric Pope MRN: [id number] Date of Birth: Feb 13, 1941  Today's Date: 07/18/2016 SLP Individual Time: 1430-1500 SLP Individual Time Calculation (min): 30 min  Short Term Goals: Week 1: SLP Short Term Goal 1 (Week 1): Pt will sustain attention to functional tasks for ~ 10 minutes with Mod A verbal cues.  SLP Short Term Goal 2 (Week 1): Pt will solve basic familiar problems with Mod A verbal cues.  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  SLP Short Term Goal 4 (Week 1): Pt will utilize compensatory memory strategies to recall new information with Mod A verbal cues.   Skilled Therapeutic Interventions: Skilled treatment session focused on addressing cognition goals. Patient completed a complex problem solving task that required him to alternate his attention between two types of information while organizing a schedule with restrictive information. Patient required Mod assist question cues to self-monitor and correct errors for accurate completion of task.  Patient also required Mod verbal cues to initiate steps of the task.  Additionally, SLP facilitated session by providing Min verbal cues to scan to left while reading paragraph level written material with no awareness of distorted meaning with 2-3 words missed per line.  Continue with current plan of care.    Function:   Cognition Comprehension Comprehension assist level: Follows basic conversation/direction with no assist  Expression   Expression assist level: Expresses basic needs/ideas: With no assist  Social Interaction Social Interaction assist level: Interacts appropriately with others with medication or extra time (anti-anxiety, antidepressant).  Problem Solving Problem solving assist level: Solves basic 50 - 74% of the time/requires cueing 25 - 49% of the time   Memory Memory assist level: Recognizes or recalls 50 - 74% of the time/requires cueing 25 - 49% of the time    Pain Pain Assessment Pain Assessment: No/denies pain  Therapy/Group: Individual Therapy  Angeleah Labrake 07/18/2016, 2:38 PM

## 2016-07-18 NOTE — Progress Notes (Signed)
Physical Therapy Session Note  Patient Details  Name: Eric Pope MRN: [id number] Date of Birth: Oct 31, 1940  Today's Date: 07/18/2016 PT Individual Time: [id number] PT Individual Time Calculation (min): 41 min   Short Term Goals: Week 1:  PT Short Term Goal 1 (Week 1): = LTGs due to LOs  Skilled Therapeutic Interventions/Progress Updates:  Pt was sitting up in room chair upon arrival. Pt was supervision with all sit-to-stand transfers. Pt required mod cues throughout therapy to scan environment to avoid hitting obstacles due to L field cut. Pt ambulated down to rehab gym and throughout therapy supervision with the longest single bout of walking being >300 ft . PT today focused on dynamic and static balance training. Pt performed 20 ft trials of tandem walking twice, grape vines once, side stepping in both directions, side stepping with ball tosses in bilateral directions, and backward walking with intermittent sitting and standing rest breaks. Pt displayed flexed head, trunk, and knee position with all activities. Pt performed single limb balance with average time of 3-4 seconds bilaterally. Pt practiced static balance throwing 2# ball againts rebounder in tandem stance and performed 2 x 20 tosses with each leg in front. Pt ambulated back to room and was left sitting up in room chair with chair alarm on and all needs within reach. Pt complained of no pain throughout therapy.      Therapy Documentation Precautions:  Precautions Precautions: Fall Precaution Comments: L visual field cut Restrictions Weight Bearing Restrictions: No Pain: Pain Assessment Pain Assessment: No/denies pain   See Function Navigator for Current Functional Status.   Therapy/Group: Individual Therapy  [name] 07/18/2016, 12:35 PM

## 2016-07-18 NOTE — Progress Notes (Signed)
Occupational Therapy Session Note  Patient Details  Name: Eric Pope MRN: [id number] Date of Birth: [birth year]  Today's Date: 07/18/2016 OT Individual Time:  -       Short Term Goals: Week 1:  OT Short Term Goal 1 (Week 1): STGS=LTGS secondary to short estimated LOS  Skilled Therapeutic Interventions/Progress Updates:    Pt seated in recliner upon arrival reporting no pain and agreeable to tx. Pt requests to go off unit. Pt ambulates with supervision throughout hospital to cafeteria and gift shop. Throughout session pt requires MAX VC and question cueing to utilize signs/strategies for path-finding. Pt requires MOD VC to scan/turn head to left to locate desired items in cafeteria, gift shop and avoid obstacles on left. Pt retrieves items from floor level and places to/from above head shelf. Pt requires VC to finish task to completion. Pt ambulates for 40 min without seated rest breaks. Upon returning to room, pt seems disoriented and states he needs to go to his room. When corrected that he is in his room and reflecting on the session, pt reports having greater insights into cognitive deficits after session.  Therapy Documentation Precautions:  Precautions Precautions: Fall Precaution Comments: L visual field cut Restrictions Weight Bearing Restrictions: No  Pain: Pain Assessment Pain Assessment: No/denies pain  See Function Navigator for Current Functional Status.   Therapy/Group: Individual Therapy  [name] 07/18/2016, 12:31 PM

## 2016-07-19 ENCOUNTER — Inpatient Hospital Stay (HOSPITAL_COMMUNITY): Payer: Medicare Other | Admitting: Speech Pathology

## 2016-07-19 ENCOUNTER — Inpatient Hospital Stay (HOSPITAL_COMMUNITY): Payer: Medicare Other

## 2016-07-19 ENCOUNTER — Inpatient Hospital Stay (HOSPITAL_COMMUNITY): Payer: Medicare Other | Admitting: Occupational Therapy

## 2016-07-19 MED ORDER — MELATONIN 3 MG PO TABS
6.0000 mg | ORAL_TABLET | Freq: Every day | ORAL | Status: DC
  Administered 2016-07-19 – 2016-07-21 (×3): 6 mg via ORAL
  Filled 2016-07-19 (×3): qty 2

## 2016-07-19 MED ORDER — ROPINIROLE HCL 0.25 MG PO TABS
0.2500 mg | ORAL_TABLET | Freq: Three times a day (TID) | ORAL | Status: DC
  Administered 2016-07-19 – 2016-07-20 (×6): 0.25 mg via ORAL
  Filled 2016-07-19 (×8): qty 1

## 2016-07-19 NOTE — Progress Notes (Addendum)
Speech Language Pathology Daily Session Note  Patient Details  Name: Eric Pope MRN: [id number] Date of Birth: 06-May-1941  Today's Date: 07/19/2016 SLP Individual Time: 0833-0930 SLP Individual Time Calculation (min): 57 min  Short Term Goals: Week 1: SLP Short Term Goal 1 (Week 1): Pt will sustain attention to functional tasks for ~ 10 minutes with Mod A verbal cues.  SLP Short Term Goal 2 (Week 1): Pt will solve basic familiar problems with Mod A verbal cues.  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  SLP Short Term Goal 4 (Week 1): Pt will utilize compensatory memory strategies to recall new information with Mod A verbal cues.   Skilled Therapeutic Interventions: Skilled treatment session focused on addressing cognition goals. SLP facilitated session by providing verbal instructions on how to navigate to the speech office as well as Mod assist repetitions and Min visual cues due to left inattention.  Patient completed complex financial and medication management tasks with Min-Mod assist verbal and visual cues to monitor for errors and Min assist verbal cues to correct.  Discussed recommendations for assist with these tasks upon discharge as well as recommended use of a medication box for home management.  Patient then required Mod assist verbal and visual cues to route find back to room despite cues for left attention patient missed his room.     Function:   Cognition Comprehension Comprehension assist level: Follows basic conversation/direction with extra time/assistive device  Expression   Expression assist level: Expresses basic 90% of the time/requires cueing < 10% of the time.  Social Interaction Social Interaction assist level: Interacts appropriately with others with medication or extra time (anti-anxiety, antidepressant).  Problem Solving Problem solving assist level: Solves  basic 90% of the time/requires cueing < 10% of the time  Memory Memory assist level: Recognizes or recalls 75 - 89% of the time/requires cueing 10 - 24% of the time    Pain Pain Assessment Pain Assessment: No/denies pain  Therapy/Group: Individual Therapy  Carmelia Roller., [place] [id number]  [place] 07/19/2016, 4:37 PM
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
## 2016-07-19 NOTE — Progress Notes (Signed)
Physical Therapy Note  Patient Details  Name: Eric Pope MRN: [id number] Date of Birth: 1941-05-08 Today's Date: 07/19/2016  1105-1210, 65 min individual tx Pain: 3/10 HA  Gait to/from gym while transporting notebook under L arm without spillage; min cues for longer strides and greater velocity, with good results.  10 MWT = 9 secondsvia .  neuromuscular re-education via multimodal cus and demo for seated pelvic dissociaiton via scooting forwward/backward;  trunk shortening/lengtheningrotating  reaching out of BOS.  Pt with delayed L trunk shortening/lengthening/rotating which improved with practice.  Standing dyanmic balance activity with L hand to reach overhead to place horseshoes on basketball hoop rim, with cues to fully attend to L.  Seated activity using floor clock for L /R heel targets clockwise/counterclockwiase x 10 each; L inattention noted.  PT gave external perturbations to elicit balance strategies.. Hip strategy absent, ankle and stepping present but delayed; improved with practice.   Pt tangential with conversation, but able to attend to task when directed to do so, x 5 minutes.   Toilet transfer to urinate, standing.   Pt left resting in w/c with quick release belt donned and alarm set.  See function navigator for current status.   [name] 07/19/2016, 11:46 AM

## 2016-07-19 NOTE — Progress Notes (Signed)
Occupational Therapy Session Note  Patient Details  Name: Eric Pope MRN: [id number] Date of Birth: 04-29-1941  Today's Date: 07/19/2016 OT Individual Time: 0700-0817 OT Individual Time Calculation (min): 77 min    Short Term Goals: Week 1:  OT Short Term Goal 1 (Week 1): STGS=LTGS secondary to short estimated LOS  Skilled Therapeutic Interventions/Progress Updates:    Pt completed bathing and dressing during session after completion of self feeding.  Supervision for all bathing but needed min to mod instructional cueing to sequence through bathing as he would ask therapist "What should I do next?".  He completed shower sitting on the seat with standing for peri care.  Dressing sit to stand from the bedside chair as well.  He completed grooming tasks standing at the sink with min instructional cueing for maintaining focus on shaving and brushing his teeth.  Pt left in bedside chair at end of session with chair alarm in place.    Therapy Documentation Precautions:  Precautions Precautions: Fall Precaution Comments: L visual field cut Restrictions Weight Bearing Restrictions: No  Pain: Pain Assessment Pain Assessment: No/denies pain ADL: See Function Navigator for Current Functional Status.   Therapy/Group: Individual Therapy  [name] OTR/L 07/19/2016, 10:55 AM
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
## 2016-07-19 NOTE — Progress Notes (Signed)
Social Work Patient ID: Pieter Partridge, male   DOB: [birth year], 76 y.o.   MRN: [id number]  Met with pt and spoke with brother-[name] to discuss team conference goals supervision level and target discharge Of 3/3. Brother concerned about having someone with him and his limited finances. Informed his insurance will not cover this and/or assisted living facility. Stuck on what the admission coordinator told them of 10-14 days here. Discussed rehab team evaluates and sets goals for pt while here. Pt is high level and is at supervision level currently, can not justify keeping him here longer. Asked brother if he could stay here since conference is over and could assist with care or coordination Of services. Lucia-pt's ex-girlfriend, now friend has not committed to assisting pt, although pt feels she will be there with him at home. Brother wants to talk with Speech therapist regarding pt's cognition and comprehension. Have asked  Melissa to contact him. Will continue to work on discharge plans for pt to try to make him as safe as possible at home.

## 2016-07-20 ENCOUNTER — Inpatient Hospital Stay (HOSPITAL_COMMUNITY): Payer: Medicare Other

## 2016-07-20 ENCOUNTER — Inpatient Hospital Stay (HOSPITAL_COMMUNITY): Payer: Medicare Other | Admitting: Physical Therapy

## 2016-07-20 ENCOUNTER — Inpatient Hospital Stay (HOSPITAL_COMMUNITY): Payer: Medicare Other | Admitting: Speech Pathology

## 2016-07-20 ENCOUNTER — Inpatient Hospital Stay (HOSPITAL_COMMUNITY): Payer: Medicare Other | Admitting: Occupational Therapy

## 2016-07-20 NOTE — Progress Notes (Signed)
Physical Therapy Note  Patient Details  Name: Eric Pope MRN: [id number] Date of Birth: 1940/10/21 Today's Date: 07/20/2016  0800-0905, 65 min individual tx Pain: no c/o  Pt reported he has barely been eating anything.  PT consulted Algis Liming, PA regarding his diet.  Gait on unit with VCs for longer strde and attention to L.  Pt consistntly misses first items on far L as he ambulates.  Floor transfer with supervision and VCs about orientation to mat on his L. Pt took 1 min 30 seconds to place pillow case on pillow to use under his head on floor mat.  neuromuscular re-education via sustained stretch bil heel cords in standing on wedge without Ue support;  bil shoulder adduction x 25 reps.  Gait with dual task,  scanning for 12 ticky notes on L (overhead to floor) ; pt found 4/12 1st pass on L; 7/12 2nd pass on L.  Pt consistently scans at waist height or lower, due to flexed trunk and head positions.  He needed max VCs fo problem solve finding all of sticky notes he had placed on table, due to L inattention  See function navigator for current status.   [name] 07/20/2016, 7:53 AM

## 2016-07-20 NOTE — Progress Notes (Signed)
Social Work Patient ID: Eric Pope, male   DOB: 24-Sep-1940, 76 y.o.   MRN: [id number]  Spoke with Dough-brother to discuss the need to come in and attend therapies with pt prior to discharge. He is planning to come in today at 1:00 made aware his therapies are at 3:00 & 4:30 pm, this should allow him to get his questions answered from the therapy team. Will let team know and work on A safe discharge plan, pt is working on someone to stay with him at home. But feels his ex-girlfriend's son can assist him. Will see if this is confirmed.

## 2016-07-20 NOTE — Progress Notes (Addendum)
Social Work Patient ID: Eric Pope, male   DOB: 01-May-1941, 76 y.o.   MRN: [id number]  Met with pt after several calls to answer his questions. He would like to go to [facility], discussed this is only OPPT The team recomennds all three disciplines-PT, OT SP. Pt feels he is good with the other two and only wants PT follow up. He is agreeable to a tub seat in his bathroom at home and aware of the private pay. Will make referral to [facility] for the equipment. His brother coming in to see him in PT & SP this afternoon, to actually see his function and get his questions answered. Will continue to work on a safe discharge plan. Have informed pt that I will not be here when brother goes through therapies today. Will touch base with him tomorrow.

## 2016-07-20 NOTE — Progress Notes (Signed)
Speech Language Pathology Daily Session Note  Patient Details  Name: Eric Pope MRN: [id number] Date of Birth: Nov 02, 1940  Today's Date: 07/20/2016 SLP Individual Time: 1500-1613 SLP Individual Time Calculation (min): 73 min  Short Term Goals: Week 1: SLP Short Term Goal 1 (Week 1): Pt will sustain attention to functional tasks for ~ 10 minutes with Mod A verbal cues.  SLP Short Term Goal 2 (Week 1): Pt will solve basic familiar problems with Mod A verbal cues.  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  SLP Short Term Goal 4 (Week 1): Pt will utilize compensatory memory strategies to recall new information with Mod A verbal cues.   Skilled Therapeutic Interventions: Skilled treatment session focused on cognition goals. SLP facilitated session by providing Max A multimodal cues to create list of 2 physical and 2 cognitive deficits post CVA. SLP provided education that at discharge, rehab team is recommending 24 hour supervision d/t cognitive deficits. List formulated of tasks that would be considered safe and unsafe. Pt voiced perseverative frustration with SLP and voiced that SLP appeared antagonist (from the beginning in acute care). Eric Pope made aware of pt's concern re SLP and pt asked to return to room and wait for brother to arrive. Pt's [name] arrived at 59, SLP provided demonstration of medication management task (that pt was not able to complete during ST session), rehab recommendation for 24 hour supervision at discharge, handout given regarding general right hemisphere impairments with all questions answered to pt and brother at this time. Of note, pt perseverative on speaking with SW to verify needs at discharge and brother would like to speak with SW re scheduling Outpatient therapy. SLP offered apology to aid in decreasing pt's perseveration and increase comprehension of  discharge planning with OT and PT. Pt and brother handed off to OT and PT.      Function:   Cognition Comprehension Comprehension assist level: Follows basic conversation/direction with no assist  Expression   Expression assist level: Expresses basic needs/ideas: With no assist  Social Interaction Social Interaction assist level: Interacts appropriately 75 - 89% of the time - Needs redirection for appropriate language or to initiate interaction.  Problem Solving Problem solving assist level: Solves basic 50 - 74% of the time/requires cueing 25 - 49% of the time  Memory Memory assist level: Recognizes or recalls 50 - 74% of the time/requires cueing 25 - 49% of the time    Pain Pain Assessment Pain Assessment: No/denies pain  Therapy/Group: Individual Therapy   Eric Pope Eric Pope, M.S., CCC-SLP Speech-Language Pathologist   Eric Pope 07/20/2016, 4:23 PM

## 2016-07-20 NOTE — Progress Notes (Signed)
Occupational Therapy Session Note  Patient Details  Name: Eric Pope MRN: [id number] Date of Birth: 12/12/1940  Today's Date: 07/20/2016 OT Individual Time: 1006-1102 OT Individual Time Calculation (min): 56 min    Short Term Goals: Week 1:  OT Short Term Goal 1 (Week 1): STGS=LTGS secondary to short estimated LOS  Skilled Therapeutic Interventions/Progress Updates:    Pt completed bathing and dressing during session.  Min instructional cueing for gathering items for dressing before starting session.  Noted part of clothing hanging in drawer of dresser after taking them out.  He needed 2 instructional cues and 2 attempts before finally moving them completely out of the drawer in order to close it completely.  Ambulated to the shower for bathing in sitting,  Discussed the need for a shower seat at discharge for safety.  Pt voices understanding and agrees.  He needed min instructional cueing to sequence bathing and to remember to wash the LUE.  Completed dressing sit to stand at the bedside chair, but pt forgot he had already gotten out new pants and was starting to put on his dirty shorts he had just taken off.  UB dressing with supervision.  He needed increased time to orient pants as well as min assist for tying shoe on the left side.  He had removed one of the strings on the left side of the shoe from the eyelet when attempting to tighten then and could not figure out what had happened.  Once therapist assisted him with re-threading, he was able to tie it.  Completed grooming tasks in standing at the sink with supervision and also toileting in standing before transferring back to the bedside chair with chair alarm in place and dietician present for education.    Therapy Documentation Precautions:  Precautions Precautions: Fall Precaution Comments: L visual field cut Restrictions Weight Bearing Restrictions: No  Pain: Pain Assessment Pain Assessment: No/denies pain ADL: See  Function Navigator for Current Functional Status.   Therapy/Group: Individual Therapy  Xavian Hardcastle OTR/L 07/20/2016, 12:15 PM

## 2016-07-20 NOTE — Progress Notes (Signed)
Nutrition Brief Note  RD consulted for discussion of alternative food choices. Pt reports appetite is fine currently and PTA with no other difficulties. Meal completion has been 100%. Pt voiced concern for lack of healthful and nutrient dense food choices available on the menu. Pt requesting wanting to speak with management in charge of the hospital menu. [facility] management have been notified and plan to visit pt regarding needs. No further nutrition interventions warranted at this time. Labs and mediations reviewed. Re-consult RD as needed.   Corrin Parker, MS, RD, LDN Pager # [phone] After hours/ weekend pager # [phone]

## 2016-07-20 NOTE — Progress Notes (Signed)
Subjective/Complaints: Pt feels like his heart rate is irregular.  Feels like his tremor in hand may be worse ROS  No CP, SOB, palpitation, mild nausea, no vomiting or diarrhea Objective: Vital Signs: Blood pressure 139/83, pulse (!) 53, temperature 97.5 F (36.4 C), temperature source Oral, resp. rate 16, height 5\' 8"  (1.727 m), weight 85.1 kg (187 lb 9.8 oz), SpO2 99 %. No results found. No results found for this or any previous visit (from the past 72 hour(s)).   HEENT: normal Cardio: RRR and no murmur Resp: CTA B/L and unlabored GI: BS positive and NT, ND Extremity:  No Edema Skin:   Intact Neuro: Alert/Oriented, Abnormal Sensory Reduced on L LT, Normal Motor, Abnormal FMC Ataxic/ dec FMC and Inattention Musc/Skel:  Other tremor RUE>LUE Gen NAD, poor awareness of deficit   Assessment/Plan: 1. Functional deficits secondary to RIght MCA infarct which require 3+ hours per day of interdisciplinary therapy in a comprehensive inpatient rehab setting. Physiatrist is providing close team supervision and 24 hour management of active medical problems listed below. Physiatrist and rehab team continue to assess barriers to discharge/monitor patient progress toward functional and medical goals. FIM: Function - Bathing Position: Shower Body parts bathed by patient: Right arm, Left arm, Chest, Abdomen, Front perineal area, Buttocks, Right upper leg, Left upper leg, Right lower leg, Left lower leg Body parts bathed by helper: Back Assist Level: Supervision or verbal cues  Function- Upper Body Dressing/Undressing What is the patient wearing?: Pull over shirt/dress Pull over shirt/dress - Perfomed by patient: Thread/unthread right sleeve, Thread/unthread left sleeve, Put head through opening, Pull shirt over trunk Assist Level: Supervision or verbal cues Set up : To obtain clothing/put away Function - Lower Body Dressing/Undressing What is the patient wearing?: Underwear, Pants, Socks,  Shoes Underwear - Performed by patient: Thread/unthread right underwear leg, Thread/unthread left underwear leg, Pull underwear up/down Pants- Performed by patient: Thread/unthread right pants leg, Thread/unthread left pants leg, Pull pants up/down Non-skid slipper socks- Performed by patient: Don/doff left sock, Don/doff right sock Socks - Performed by patient: Don/doff right sock, Don/doff left sock Shoes - Performed by patient: Don/doff right shoe, Don/doff left shoe, Fasten right, Fasten left Assist for footwear: Supervision/touching assist Assist for lower body dressing: Supervision or verbal cues  Function - Toileting Toileting activity did not occur: No continent bowel/bladder event Toileting steps completed by patient: Adjust clothing prior to toileting, Adjust clothing after toileting Toileting steps completed by helper: Adjust clothing after toileting, Adjust clothing prior to toileting Toileting Assistive Devices: Grab bar or rail Assist level: Supervision or verbal cues  Function Midwife transfer assistive device: Grab bar Assist level to toilet: Supervision or verbal cues Assist level from toilet: Supervision or verbal cues  Function - Chair/bed transfer Chair/bed transfer method: Ambulatory Chair/bed transfer assist level: Supervision or verbal cues Chair/bed transfer assistive device: Armrests Chair/bed transfer details: Verbal cues for precautions/safety  Function - Locomotion: Wheelchair Will patient use wheelchair at discharge?: No Function - Locomotion: Ambulation Assistive device: No device Max distance: 150 Assist level: Supervision or verbal cues Assist level: Supervision or verbal cues Assist level: Supervision or verbal cues Assist level: Supervision or verbal cues Assist level: Touching or steadying assistance (Pt > 75%)  Function - Comprehension Comprehension: Auditory Comprehension assist level: Follows basic conversation/direction  with extra time/assistive device  Function - Expression Expression: Verbal Expression assist level: Expresses basic 90% of the time/requires cueing < 10% of the time.  Function - Social Interaction Social Interaction  assist level: Interacts appropriately with others with medication or extra time (anti-anxiety, antidepressant).  Function - Problem Solving Problem solving assist level: Solves basic 90% of the time/requires cueing < 10% of the time  Function - Memory Memory assist level: Recognizes or recalls 75 - 89% of the time/requires cueing 10 - 24% of the time Patient normally able to recall (first 3 days only): Current season, Staff names and faces, That he or she is in a hospital  Medical Problem List and Plan: 1. Left visual fields deficits,left neglect and hemisensory deficitssecondary to Right MCA infarctfelt to be embolic, team conf in am PT, OT, SLP CIR PT, OT, SLP, discussed 3/3 discharge2. DVT Prophylaxis/Anticoagulation: Xarelto initiated 07/13/2016 -fall risk which will persist after D/C will need Sup 3. Pain Management: Tylenol as needed 4. Mood: Provide emotional support 5. Neuropsych: This patient iscapable of making decisions on herown behalf. 6. Skin/Wound Care: Routine skin checks 7. Fluids/Electrolytes/Nutrition: Routine I&O withfollow-up chemistries upon admit 8. Hypertension.No present anti-hypertensive meds .Patient on HCTZ 12.5mg  QD,aldactone 12.5 mg daily PTA .Monitor with increased mobility 9.Atrial fibrillation. Cardiac rate controlled. 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 Patient has refused Sinemet in the past. Now agreeable to dopamine agonist, trial Requip, pt worried about side effects that we discussed.  He agreed to another day trial and will reassess  in am -team to address with adaptive equipment/strategies as appropriate 11.Constipation.Adjust bowel program 12.Hyperlipidemia. 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

## 2016-07-20 NOTE — Progress Notes (Signed)
Occupational Therapy Session Note  Patient Details  Name: Eric Pope MRN: [id number] Date of Birth: 10-Jan-1941  Today's Date: 07/20/2016 OT Individual Time: 1430-1503 OT Individual Time Calculation (min): 33 min    Skilled Therapeutic Interventions/Progress Updates:    Pt ambulated to the [place] gym for session in order to use the Dynavision.  Supervision for mobility, and as he walked therapist asked him to locate items in the hallway that were green.  Pt only able to locate approximately 40-50% of items on the left of midline that were green.  Once in the gym initial use of Dynavision demonstrated overall pattern of decreased scanning with average times of 3 seconds for both the right and the left.  Pt reporting increased difficulty with using the RUE secondary to pre-existing injury.  For next sets had pt just use the LUE.  For 3 sets he was able to average 1.5-1.9 seconds for speed in pressing the red lights, however he missed 6-7 out of 37 possible lights. Ninety percent of those lights were located on the left side that he missed.  Worked on teaching a circular pattern of scanning to locate red lights, which improved with subsequent trials but pt still consistently missing 2-3 on the left of midline.  With return to room had pt identify items that were red.  He was unable to carryover the circular pattern of scanning but instead attempted to rely on a linear scanning, which again resulted in him missing 50% of items on the left.  As it was brought to his attention he would focus more on the left and would even miss some on the right as well.  Returned to room at end of session.  Pt is able to state awareness of visual field deficit but cannot compensate without cueing.  Pt left in bedside recliner with call button in reach and chair alarm in place.    Therapy Documentation Precautions:  Precautions Precautions: Fall Precaution Comments: L visual field cut Restrictions Weight Bearing  Restrictions: No  Pain: Pain Assessment Pain Assessment: No/denies pain ADL: See Function Navigator for Current Functional Status.   Therapy/Group: Individual Therapy  Jouri Threat OTR/L 07/20/2016, 3:46 PM

## 2016-07-21 ENCOUNTER — Inpatient Hospital Stay (HOSPITAL_COMMUNITY): Payer: Medicare Other | Admitting: Occupational Therapy

## 2016-07-21 ENCOUNTER — Inpatient Hospital Stay (HOSPITAL_COMMUNITY): Payer: Medicare Other | Admitting: Physical Therapy

## 2016-07-21 ENCOUNTER — Encounter (HOSPITAL_COMMUNITY): Payer: Self-pay

## 2016-07-21 ENCOUNTER — Inpatient Hospital Stay (HOSPITAL_COMMUNITY): Payer: Medicare Other | Admitting: Speech Pathology

## 2016-07-21 MED ORDER — SPIRONOLACTONE 25 MG PO TABS: 25.0000 mg | ORAL_TABLET | Freq: Every day | ORAL | Status: DC

## 2016-07-21 MED ORDER — SPIRONOLACTONE 25 MG PO TABS
12.5000 mg | ORAL_TABLET | Freq: Every day | ORAL | Status: DC
  Administered 2016-07-21 – 2016-07-22 (×2): 12.5 mg via ORAL
  Filled 2016-07-21 (×2): qty 1

## 2016-07-21 MED ORDER — HYDROCHLOROTHIAZIDE 12.5 MG PO CAPS: 12.5000 mg | ORAL_CAPSULE | Freq: Every day | ORAL | 1 refills | Status: DC | PRN

## 2016-07-21 MED ORDER — SPIRONOLACTONE 25 MG PO TABS: 12.5000 mg | ORAL_TABLET | Freq: Every day | ORAL | 0 refills | Status: DC

## 2016-07-21 MED ORDER — HYDROCHLOROTHIAZIDE 12.5 MG PO CAPS
12.5000 mg | ORAL_CAPSULE | Freq: Every day | ORAL | Status: DC
  Administered 2016-07-21 – 2016-07-22 (×2): 12.5 mg via ORAL
  Filled 2016-07-21 (×2): qty 1

## 2016-07-21 MED ORDER — SPIRONOLACTONE-HCTZ 25-25 MG PO TABS: 0.5000 | ORAL_TABLET | Freq: Every day | ORAL | Status: DC

## 2016-07-21 MED ORDER — MELATONIN 3 MG PO TABS: 6.0000 mg | ORAL_TABLET | Freq: Every day | ORAL | 0 refills | Status: DC

## 2016-07-21 MED ORDER — FOLIC ACID 1 MG PO TABS: 1.0000 mg | ORAL_TABLET | Freq: Every day | ORAL | 0 refills | Status: AC

## 2016-07-21 MED ORDER — TELMISARTAN 40 MG PO TABS: 40.0000 mg | ORAL_TABLET | Freq: Every day | ORAL | 1 refills | Status: DC | PRN

## 2016-07-21 MED ORDER — RIVAROXABAN 20 MG PO TABS: 20.0000 mg | ORAL_TABLET | Freq: Every day | ORAL | 1 refills | Status: DC

## 2016-07-21 MED ORDER — ATORVASTATIN CALCIUM 20 MG PO TABS: 20.0000 mg | ORAL_TABLET | Freq: Every day | ORAL | 1 refills | Status: DC

## 2016-07-21 NOTE — Progress Notes (Signed)
Speech Language Pathology Discharge Summary  Patient Details  Name: Eric Pope MRN: [id number] Date of Birth: [birth year]  Today's Date: 07/21/2016 SLP Individual Time: 0915-1000 SLP Individual Time Calculation (min): 45 min   Skilled Therapeutic Interventions:  Skilled treatment session focused on cognition goals. SLP facilitated session by providing strategies to compensate for left visual deficits. SLP and pt placed yellow post-it notes on left side of computer monitor and book edges to promote increased visual scanning for comprehension of information on left. Pt in good emotional state during session and goals reviewed with pt feedback on goals for outpatient therapy. Brother called during session and voiced appreciate for SLP, services and noted that pt sound great (emotionally during this session). Pt left upright in recliner, chair alarm and all needs within reach.     Patient has met 5 of 5 long term goals.  Patient to discharge at overall Mod level.   Clinical Impression/Discharge Summary: Pt has made progress with ST services and has met goals as he is at a Mod A level for all cognitive function. Pt requires skilled ST services after discharge to target attention, semi-complex problem solving, memory and intellectual/emergent awareness. Pt is in agreement with discharge goals, follow up skilled ST and family states that they will be providing 24 hour supervision as recommended by rehab team.   Care Partner:  Caregiver Able to Provide Assistance: Yes (Per family, they will be providing 24 hour supervision)  Type of Caregiver Assistance: Cognitive  Recommendation:  24 hour supervision/assistance;Home Health SLP;Outpatient SLP  Rationale for SLP Follow Up: Maximize cognitive function and independence   Equipment: N/A   Reasons for discharge: Treatment goals met   Patient/Family Agrees with Progress Made and Goals Achieved: Yes   Function:  Eating Eating     Eating  Assist Level: More than reasonable amount of time           Cognition Comprehension Comprehension assist level: Follows basic conversation/direction with no assist  Expression   Expression assist level: Expresses basic needs/ideas: With no assist  Social Interaction Social Interaction assist level: Interacts appropriately 90% of the time - Needs monitoring or encouragement for participation or interaction.  Problem Solving Problem solving assist level: Solves basic 50 - 74% of the time/requires cueing 25 - 49% of the time  Memory Memory assist level: Recognizes or recalls 50 - 74% of the time/requires cueing 25 - 49% of the time   Lucianne Smestad B. Rutherford Nail, M.S., CCC-SLP Speech-Language Pathologist  Annibelle Brazie 07/21/2016, 9:42 AM

## 2016-07-21 NOTE — Progress Notes (Signed)
Occupational Therapy Session Note  Patient Details  Name: Eric Pope MRN: [id number] Date of Birth: 01-06-1941  Today's Date: 07/21/2016 OT Individual Time: [phone] OT Individual Time Calculation (min): 58 min     Short Term Goals: Week 1:  OT Short Term Goal 1 (Week 1): STGS=LTGS secondary to short estimated LOS  Skilled Therapeutic Interventions/Progress Updates:    Pt completed bathing and dressing during session.  Min questioning cueing to sequence through bathing secondary to decreased attention to washing the left arm.  Dressing sit to stand at the bedside chair.  Pt with increased difficulty determining orientation of pants and shirt.  He needed mod instructional cueing to understand that the pants were inside out and how to get them right side out.  Initially, he donned pullover shirt but it was backwards and he did not realize it.  Cued him on this but he was still unable to state how to determine it was backwards.  He took it off and then could not re-orient it to donn it correctly.  Therapist had to intervene and set him up with it again, then he could donn it.  Supervision for all grooming tasks at the sink in standing to conclude session.  Pt left in recliner with chair alarm in place and call button in reach.    Therapy Documentation Precautions:  Precautions Precautions: Fall Precaution Comments: L visual field cut Restrictions Weight Bearing Restrictions: No  Pain: Pain Assessment Pain Assessment: No/denies pain ADL: See Function Navigator for Current Functional Status.   Therapy/Group: Individual Therapy  Janasha Barkalow OTR/L 07/21/2016, 8:58 AM

## 2016-07-21 NOTE — Progress Notes (Signed)
Social Work  Discharge Note  The overall goal for the admission was met for:   Discharge location: [place] TO [place]  Length of Stay: Yes-8 DAYS  Discharge activity level: Yes-SUPERVISION LEVEL  Home/community participation: Yes  Services provided included: MD, RD, PT, OT, SLP, RN, CM, Pharmacy, Neuropsych and SW  Financial Services: Private Insurance: BLUE MEDICARE  Follow-up services arranged: Outpatient: [facility]-PT,OT,SP-3/6 9:00-11:00 THEN 12:30-1;15 PM, DME: [facility] and Patient/Family has no preference for HH/DME agencies  Comments (or additional information):BROTHER WAS HERE FOR TEACHING A CLASS AND CAN STAY A FEW DAYS WITH PT UNTIL WED TO MAKE SURE TRANSITION HOME IS SMOOTH. AWARE DOES NOT QUALIFY FOR SNF: CONSIDERED CUSTODIAL CARE. GAVE HIM PRIVATE DUTY LIST AND ASSISTED LIVING LIST, WHICH ARE BOTH PRIVATE PAY. PT REPORTS WILL BE HIRING LUCIA'S SON-ARTHUR TO PROVIDE 24 HR SUPERVISION TOO. PT IS AT EXTREME HIGH RISK TO FALL DUE TO LEFT INATTENTION BUT ALSO STUBBORN AND HEAD STRONG. MD FEELS HE IS CAPABLE TO MAKE HIS OWN DECISIONS.   Patient/Family verbalized understanding of follow-up arrangements: Yes  Individual responsible for coordination of the follow-up plan: SELF & [name]-BROTHER FOR A SHORT TIME  Confirmed correct DME delivered: Elease Hashimoto 07/21/2016    Elease Hashimoto

## 2016-07-21 NOTE — Progress Notes (Addendum)
Physical Therapy Session Note  Patient Details  Name: Eric Pope MRN: [id number] Date of Birth: Jan 18, 1941  Today's Date: 07/20/2016 PT Individual Time:  1620-1630   PT Individual Time Calculation: 10 min   Short Term Goals: Week 1:  PT Short Term Goal 1 (Week 1): = LTGs due to LOs  Skilled Therapeutic Interventions/Progress Updates:   Pt received sitting in recliner with brother present in room. PT and OT present for family education. regarding pt's funcitonal impairments and need for supervision upon discharge. PT and OT educated pt and brother on concerns for L field visual deficits, and potential dangers that could pose once in home environment as well as pt's poor automatic sequencing or corrections familiar tasks. Pt asked to demonstrate to brother visual field deficit while sitting in recliner. Pt and his  brother agreeable to need to 24 hour supervision for a short time following d/c. PT and OT also discussed potential needs for DME as well as adjusting furniture to reduce cluttter and removing any through rugs to help reduce fall risk in the home. Pt left sitting in recliner with brother present.      Therapy Documentation Precautions:  Precautions Precautions: Fall Precaution Comments: L visual field cut Restrictions Weight Bearing Restrictions: No   See Function Navigator for Current Functional Status.   Therapy/Group: Individual Therapy  Lorie Phenix 07/21/2016, 5:15 AM

## 2016-07-21 NOTE — Progress Notes (Signed)
Subjective/Complaints: No palpitation overnite, notes no change in tremor, bradykinesia has not been a targeted symptom ROS  No CP, SOB, palpitation, mild nausea, no vomiting or diarrhea Objective: Vital Signs: Blood pressure (!) 155/91, pulse (!) 52, temperature 97.6 F (36.4 C), temperature source Oral, resp. rate 17, height 5\' 8"  (1.727 m), weight 85.1 kg (187 lb 9.8 oz), SpO2 100 %. No results found. No results found for this or any previous visit (from the past 72 hour(s)).   HEENT: normal Cardio: RRR and no murmur Resp: CTA B/L and unlabored GI: BS positive and NT, ND Extremity:  No Edema Skin:   Intact Neuro: Alert/Oriented, Abnormal Sensory Reduced on L LT, Normal Motor, Abnormal FMC Ataxic/ dec FMC and Inattention Musc/Skel:  Other tremor RUE>LUE Gen NAD, poor awareness of deficit   Assessment/Plan: 1. Functional deficits secondary to RIght MCA infarct which require 3+ hours per day of interdisciplinary therapy in a comprehensive inpatient rehab setting. Physiatrist is providing close team supervision and 24 hour management of active medical problems listed below. Physiatrist and rehab team continue to assess barriers to discharge/monitor patient progress toward functional and medical goals. FIM: Function - Bathing Position: Shower Body parts bathed by patient: Right arm, Left arm, Chest, Abdomen, Front perineal area, Buttocks, Right upper leg, Left upper leg, Right lower leg, Left lower leg Body parts bathed by helper: Back Assist Level: Supervision or verbal cues  Function- Upper Body Dressing/Undressing What is the patient wearing?: Pull over shirt/dress Pull over shirt/dress - Perfomed by patient: Thread/unthread right sleeve, Thread/unthread left sleeve, Put head through opening, Pull shirt over trunk Assist Level: Supervision or verbal cues Set up : To obtain clothing/put away Function - Lower Body Dressing/Undressing What is the patient wearing?: Underwear,  Pants, Socks, Shoes Underwear - Performed by patient: Thread/unthread right underwear leg, Thread/unthread left underwear leg, Pull underwear up/down Pants- Performed by patient: Thread/unthread right pants leg, Thread/unthread left pants leg, Pull pants up/down Non-skid slipper socks- Performed by patient: Don/doff left sock, Don/doff right sock Socks - Performed by patient: Don/doff right sock, Don/doff left sock Shoes - Performed by patient: Don/doff right shoe, Don/doff left shoe, Fasten right Shoes - Performed by helper: Fasten left Assist for footwear: Supervision/touching assist Assist for lower body dressing: Supervision or verbal cues  Function - Toileting Toileting activity did not occur: No continent bowel/bladder event Toileting steps completed by patient: Adjust clothing prior to toileting, Performs perineal hygiene, Adjust clothing after toileting Toileting steps completed by helper: Adjust clothing after toileting, Adjust clothing prior to toileting Toileting Assistive Devices: Grab bar or rail Assist level: Supervision or verbal cues  Function - Toilet Transfers Toilet transfer assistive device: Grab bar Assist level to toilet: Supervision or verbal cues Assist level from toilet: Supervision or verbal cues  Function - Chair/bed transfer Chair/bed transfer method: Ambulatory Chair/bed transfer assist level: Supervision or verbal cues Chair/bed transfer assistive device: Armrests Chair/bed transfer details: Verbal cues for precautions/safety  Function - Locomotion: Wheelchair Will patient use wheelchair at discharge?: No Function - Locomotion: Ambulation Assistive device: No device Max distance: 200 Assist level: Supervision or verbal cues Assist level: Supervision or verbal cues Assist level: Supervision or verbal cues Assist level: Supervision or verbal cues Assist level: Touching or steadying assistance (Pt > 75%)  Function - Comprehension Comprehension:  Auditory Comprehension assist level: Follows basic conversation/direction with no assist  Function - Expression Expression: Verbal Expression assist level: Expresses basic needs/ideas: With no assist  Function - Social Interaction Social Interaction assist  level: Interacts appropriately 75 - 89% of the time - Needs redirection for appropriate language or to initiate interaction.  Function - Problem Solving Problem solving assist level: Solves basic 50 - 74% of the time/requires cueing 25 - 49% of the time  Function - Memory Memory assist level: Recognizes or recalls 50 - 74% of the time/requires cueing 25 - 49% of the time Patient normally able to recall (first 3 days only): That he or she is in a hospital, Staff names and faces  Medical Problem List and Plan: 1. 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 DVT Prophylaxis/Anticoagulation: Xarelto initiated 07/13/2016 -fall risk which will persist after D/C will need Sup 3. Pain Management: Tylenol as needed 4. Mood: Provide emotional support 5. Neuropsych: This patient iscapable of making decisions on herown behalf. 6. Skin/Wound Care: Routine skin checks 7. Fluids/Electrolytes/Nutrition: Routine I&O withfollow-up chemistries upon admit 8. Hypertension. Home meds    Patient onMicardic  HCTZ 12.5mg  QD,aldactone 12.5 mg daily PTA Currently on Avapro (sub for Micardis), will resume aldactizide Vitals:   07/20/16 1402 07/21/16 0522  BP: (!) 140/99 (!) 155/91  Pulse: 63 (!) 52  Resp: 17 17  Temp: 97.8 F (36.6 C) 97.6 F (36.4 C)   9.Atrial fibrillation. Cardiac rate controlled. 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 Patient has refused Sinemet in the past. Now agreeable to dopamine agonist, trial Requip, no improvement with  tremor will d/c given need to simplify regimen, will have limited supervision at home-team to address with adaptive equipment/strategies as appropriate 11.Constipation.Adjust bowel program 12.Hyperlipidemia. Lipitor    13.  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

## 2016-07-21 NOTE — Discharge Summary (Signed)
Discharge summary job [id number]

## 2016-07-21 NOTE — Progress Notes (Signed)
Social Work Patient ID: Eric Pope, male   DOB: [birth year], 76 y.o.   MRN: [id number]   Met with pt to discuss family education yesterday he feels they got it all ironed out and he is ready to go home Tomorrow. His brother plans to sty until Wed and will set pt up with [facility] so he can get back and forth to OP therapies. He will hire Arthur-Lucia's son to provide 24 hr supervision for him. He is aware he will need Someone with him at home. Have given him the OP directions and written down instructions for appointments starting on Tuesday. Also gave him the private duty agency list and ALF list. Pt is at high risk to fall at home with his Left sided inattention, hopefully will do well at home.

## 2016-07-21 NOTE — Progress Notes (Signed)
Physical Therapy Discharge Summary  Patient Details  Name: Eric Pope MRN: [id number] Date of Birth: 05-21-1941  Today's Date: 07/21/2016 PT Individual Time: 1615-1700 ( 45 min )       Patient has met 13 of 13 long term goals due to improved balance, improved activity tolerance, improved awareness of deficits.  Patient to discharge at an ambulatory level Supervision.   Patient's care partner is independent to provide the necessary physical and cognitive assistance at discharge.  Recommendation:  Patient will benefit from ongoing skilled PT services in outpatient setting to continue to advance safe functional mobility, address ongoing impairments in balance, awareness, safety, coordination, and minimize fall risk.  Equipment: No equipment provided  Reasons for discharge: treatment goals met and discharge from hospital  Patient/family agrees with progress made and goals achieved: Yes  PT Discharge PT instructed pt in grad day assessment to measure progress towards goals; see below for results. PT also instructed patient in discharge recommendations and standing balance HEP with SLS and tandem stance with UE support. Pt left sitting in WC with call bell in reach and all needs met.   Precautions/Restrictions     Vital Signs Therapy Vitals Temp: 97.7 F (36.5 C) Temp Source: Oral Pulse Rate: 61 Resp: 18 BP: (!) 180/100 Patient Position (if appropriate): Lying Oxygen Therapy SpO2: 97 % O2 Device: Not Delivered Pain   0/10  Vision/Perception    L field cut.  Cognition Overall Cognitive Status: Impaired/Different from baseline Arousal/Alertness: Awake/alert Orientation Level: Oriented X4 Attention: Sustained;Selective Sustained Attention: Impaired Sustained Attention Impairment: Functional basic Selective Attention: Impaired Memory: Impaired Memory Impairment: Decreased recall of new information Awareness: Impaired Awareness Impairment: Intellectual  impairment Problem Solving: Impaired Problem Solving Impairment: Functional basic Sequencing: Impaired Sequencing Impairment: Functional basic Safety/Judgment: Impaired Comments: Pt is able to verbalize left visual field deficit, but has difficulty with compensation in functional tasks.   Sensation Sensation Light Touch: Appears Intact Stereognosis: Not tested Hot/Cold: Appears Intact Proprioception: Appears Intact Coordination Gross Motor Movements are Fluid and Coordinated: Yes Fine Motor Movements are Fluid and Coordinated: No Finger Nose Finger Test: B hand tremors Motor  Motor Motor: Other (comment);Hemiplegia Motor - Discharge Observations: tremors  x 4 extremities  Mobility Bed Mobility Bed Mobility: Rolling Right;Rolling Left;Supine to Sit;Sit to Supine Rolling Right: 6: Modified independent (Device/Increase time) Rolling Left: 6: Modified independent (Device/Increase time) Supine to Sit: 6: Modified independent (Device/Increase time) Sit to Supine: HOB flat;6: Modified independent (Device/Increase time) Transfers Transfers: Yes Sit to Stand: 6: Modified independent (Device/Increase time) Stand Pivot Transfers: 6: Modified independent (Device/Increase time) Locomotion  Ambulation Ambulation: Yes Ambulation/Gait Assistance: 5: Supervision Assistive device: None Gait Gait: Yes Gait Pattern: Impaired Gait Pattern: Trunk flexed;Narrow base of support;Decreased trunk rotation;Shuffle;Decreased step length - left;Decreased step length - right Gait velocity: reduced High Level Ambulation High Level Ambulation: Backwards walking Backwards Walking: supervision Stairs / Additional Locomotion Stairs: Yes Stairs Assistance: 5: Supervision Stairs Assistance Details: Verbal cues for precautions/safety Stair Management Technique: One rail Right Height of Stairs: 6 Ramp: 5: Supervision Curb: 5: Supervision Wheelchair Mobility Wheelchair Mobility: No  Trunk/Postural  Assessment  Cervical Assessment Cervical Assessment: Exceptions to [facility] (slightly forward head) Thoracic Assessment Thoracic Assessment: Exceptions to [facility] (slight thoracic kyphosis) Lumbar Assessment Lumbar Assessment: Within Functional Limits Postural Control Postural Control: Within Functional Limits  Balance Balance Balance Assessed: Yes Berg Balance Test Sit to Stand: Able to stand without using hands and stabilize independently Standing Unsupported: Able to stand safely 2 minutes Sitting with Back  Unsupported but Feet Supported on Floor or Stool: Able to sit safely and securely 2 minutes Stand to Sit: Sits safely with minimal use of hands Transfers: Able to transfer safely, minor use of hands Standing Unsupported with Eyes Closed: Able to stand 10 seconds safely Standing Ubsupported with Feet Together: Able to place feet together independently and stand 1 minute safely From Standing, Reach Forward with Outstretched Arm: Can reach forward >12 cm safely (5") From Standing Position, Pick up Object from Floor: Able to pick up shoe safely and easily From Standing Position, Turn to Look Behind Over each Shoulder: Looks behind one side only/other side shows less weight shift Turn 360 Degrees: Needs close supervision or verbal cueing Standing Unsupported, Alternately Place Feet on Step/Stool: Able to complete >2 steps/needs minimal assist Standing Unsupported, One Foot in Front: Able to take small step independently and hold 30 seconds Standing on One Leg: Tries to lift leg/unable to hold 3 seconds but remains standing independently Total Score: 43 Static Sitting Balance Static Sitting - Level of Assistance: 6: Modified independent (Device/Increase time) Dynamic Sitting Balance Dynamic Sitting - Level of Assistance: 6: Modified independent (Device/Increase time) Static Standing Balance Static Standing - Level of Assistance: 6: Modified independent (Device/Increase time) Dynamic Standing  Balance Dynamic Standing - Level of Assistance: 5: Stand by assistance Extremity Assessment      RLE Assessment RLE Assessment: Within Functional Limits (tight heel cord and hamstrings) LLE Assessment LLE Assessment: Exceptions to [facility] (tight heel cord and hamstrings) LLE Strength LLE Overall Strength Comments: grossly in sitting 4+/5 hip flex/ext/abd/add and knee flex/ext, ankle DF (mostly toe ext)   See Function Navigator for Current Functional Status.  Lorie Phenix 07/22/2016, 6:31 AM

## 2016-07-21 NOTE — Discharge Instructions (Signed)
Inpatient Rehab Discharge Instructions  Eric Pope Discharge date and time: No discharge date for patient encounter.   Activities/Precautions/ Functional Status: Activity: activity as tolerated Diet: regular diet Wound Care: none needed Functional status:  ___ No restrictions     ___ Walk up steps independently ___ 24/7 supervision/assistance   ___ Walk up steps with assistance ___ Intermittent supervision/assistance  ___ Bathe/dress independently ___ Walk with walker     _x__ Bathe/dress with assistance ___ Walk Independently    ___ Shower independently ___ Walk with assistance    ___ Shower with assistance ___ No alcohol     ___ Return to work/school ________  Special Instructions: No driving    COMMUNITY REFERRALS UPON DISCHARGE:    Outpatient: PT, OT, SP  Agency:[facility]  ADDRESS: [place] [address]  Phone:[phone]   Date of Last Service:07/22/2016  Appointment Date/Time: MARCH 6 Tuesday 9:00-11:00 BREAK UNTIL 12:30-1:15 PM OTHER APPOINTMENTS WILL BE MADE WHILE THERE Tuesday  Medical Equipment/Items Ordered:TUB SEAT WITH BACK  Agency/Supplier:[facility]   [phone] Other:PRIVATE DUTY LIST AND ASSISTED LIVING FACILITY LIST  GENERAL COMMUNITY RESOURCES FOR PATIENT/FAMILY: Support Groups:[facility] SUPPORT GROUP EVERY SECOND Thursday @ 3:00-4:00 PM ON THE REHAB UNIT QUESTIONS CONTACT CAITLYN [id number]  STROKE/TIA DISCHARGE INSTRUCTIONS SMOKING Cigarette smoking nearly doubles your risk of having a stroke & is the single most alterable risk factor  If you smoke or have smoked in the last 12 months, you are advised to quit smoking for your health.  Most of the excess cardiovascular risk related to smoking disappears within a year of stopping.  Ask you doctor about anti-smoking medications  [place] Quit Line: 1-800-QUIT NOW  Free Smoking Cessation Classes [phone]  CHOLESTEROL Know your levels; limit fat &  cholesterol in your diet  Lipid Panel     Component Value Date/Time   CHOL 197 07/11/2016 0513   TRIG 65 07/11/2016 0513   HDL 66 07/11/2016 0513   CHOLHDL 3.0 07/11/2016 0513   VLDL 13 07/11/2016 0513   LDLCALC 118 (H) 07/11/2016 0513      Many patients benefit from treatment even if their cholesterol is at goal.  Goal: Total Cholesterol (CHOL) less than 160  Goal:  Triglycerides (TRIG) less than 150  Goal:  HDL greater than 40  Goal:  LDL (LDLCALC) less than 100   BLOOD PRESSURE American Stroke Association blood pressure target is less that 120/80 mm/Hg  Your discharge blood pressure is:  BP: (!) 153/96  Monitor your blood pressure  Limit your salt and alcohol intake  Many individuals will require more than one medication for high blood pressure  DIABETES (A1c is a blood sugar average for last 3 months) Goal HGBA1c is under 7% (HBGA1c is blood sugar average for last 3 months)  Diabetes: No known diagnosis of diabetes    Lab Results  Component Value Date   HGBA1C 5.3 07/11/2016     Your HGBA1c can be lowered with medications, healthy diet, and exercise.  Check your blood sugar as directed by your physician  Call your physician if you experience unexplained or low blood sugars.  PHYSICAL ACTIVITY/REHABILITATION Goal is 30 minutes at least 4 days per week  Activity: Increase activity slowly, Therapies: Physical Therapy: Home Health Return to work:   Activity decreases your risk of heart attack and stroke and makes your heart stronger.  It helps control your weight and blood pressure; helps you relax and can improve your mood.  Participate in a regular exercise program.  Talk with your doctor about the best form of exercise for you (dancing, walking, swimming, cycling).  DIET/WEIGHT Goal is to maintain a healthy weight  Your discharge diet is: Diet Heart Room service appropriate? Yes; Fluid consistency: Thin  liquids Your height is:  Height: 5\' 8"  (172.7 cm) Your  current weight is: Weight: 84 kg (185 lb 3.2 oz) Your Body Mass Index (BMI) is:  BMI (Calculated): 28.2  Following the type of diet specifically designed for you will help prevent another stroke.  Your goal weight range is:    Your goal Body Mass Index (BMI) is 19-24.  Healthy food habits can help reduce 3 risk factors for stroke:  High cholesterol, hypertension, and excess weight.  RESOURCES Stroke/Support Group:  Call [phone]   STROKE EDUCATION PROVIDED/REVIEWED AND GIVEN TO PATIENT Stroke warning signs and symptoms How to activate emergency medical system (call 911). Medications prescribed at discharge. Need for follow-up after discharge. Personal risk factors for stroke. Pneumonia vaccine given:  Flu vaccine given:  My questions have been answered, the writing is legible, and I understand these instructions.  I will adhere to these goals & educational materials that have been provided to me after my discharge from the hospital.      My questions have been answered and I understand these instructions. I will adhere to these goals and the provided educational materials after my discharge from the hospital.  Patient/Caregiver Signature _______________________________ Date __________  Clinician Signature _______________________________________ Date __________  Please bring this form and your medication list with you to all your follow-up doctor's appointments.

## 2016-07-21 NOTE — Progress Notes (Addendum)
Occupational Therapy Discharge Summary  Patient Details  Name: Eric Pope MRN: [id number] Date of Birth: 06/16/1940  Today's Date: 07/21/2016 OT Individual Time: 1401-1445 OT Individual Time Calculation (min): 44 min    Session Note:  Pt worked on simple meal prep and simple home management task of making up the bed.  He was able to locate items and sequence task for boiling and egg with min questioning cueing.  While eggs was boiling he was asked to make up the bed in the apartment.  Significant difficulty was noted with orientation of all parts of linens as well as being able to place them on the bed and spread out.  Mod facilitation was required for him to put on all of the sheets, comforter, and to donn pillowcases.  During this process he was able to recall checking on the egg and divided his attention between both tasks without verbal cueing.  He turned on and off the appropriate burner as well.  Once finished discussed his difficulty with organizing and making the bed secondary to visual perceptual deficits.  Pt recognized the difficulty but unsure how he will carryover the understanding of these deficits.  Pt returned back to the room at end of session.    Patient has met 13 of 13 long term goals due to improved balance, postural control, ability to compensate for deficits, improved attention and improved awareness.  Patient to discharge at overall Supervision level.  Patient's care partner is independent to provide the necessary physical and cognitive assistance at discharge.  Pt will have initial supervision for the first few days but after that will be alone.  Feel he needs supervision 24 hour for safety. This has been conveyed to his brother and he voices understanding.   Reasons goals not met: NA  Recommendation:  Patient will benefit from ongoing skilled OT services in outpatient setting to continue to advance functional skills in the area of BADL.  Pt still needs supervision  for safety secondary to left visual field deficit and decreased sequencing and safety.  He demonstrates increased difficulty with orientation of objects in daily function including dressing and home management tasks.  Feel pt needs 24 hour supervision for safety and continued HHOT to help increase overall independence and safety and to continue compensation/treatment with visual field deficit.  Equipment: shower seat  Reasons for discharge: treatment goals met and discharge from hospital  Patient/family agrees with progress made and goals achieved: Yes  OT Discharge Precautions/Restrictions  Precautions Precautions: Fall Precaution Comments: L visual field cut Restrictions Weight Bearing Restrictions: No  Pain Pain Assessment Pain Assessment: No/denies pain ADL  See Function Section of chart for details  Vision/Perception  Vision- History Baseline Vision/History: Wears glasses Wears Glasses: Distance only Patient Visual Report: No change from baseline Vision- Assessment Vision Assessment?: Yes Eye Alignment: Within Functional Limits Ocular Range of Motion: Within Functional Limits Tracking/Visual Pursuits: Decreased smoothness of vertical tracking;Decreased smoothness of horizontal tracking Visual Fields: Left homonymous hemianopsia  Cognition Overall Cognitive Status: Impaired/Different from baseline Arousal/Alertness: Awake/alert Orientation Level: Oriented X4 Attention: Sustained;Selective Sustained Attention: Impaired Sustained Attention Impairment: Functional basic Selective Attention: Impaired Memory: Impaired Memory Impairment: Decreased recall of new information Awareness: Impaired Awareness Impairment: Intellectual impairment Problem Solving: Impaired Problem Solving Impairment: Functional basic Sequencing: Impaired Sequencing Impairment: Functional basic Safety/Judgment: Impaired Comments: Pt is able to verbalize left visual field deficit, but has  difficulty with compensation in functional tasks.   Sensation Sensation Light Touch: Appears Intact Stereognosis: Appears Intact Hot/Cold:  Appears Intact Proprioception: Appears Intact Coordination Gross Motor Movements are Fluid and Coordinated: No Fine Motor Movements are Fluid and Coordinated: No Coordination and Movement Description: Pt with BUE tremors, with the LUE being more severe.   Finger Nose Finger Test: B hand tremors Motor  Motor Motor: Ataxia Motor - Skilled Clinical Observations: tremors  x 4 extremities Motor - Discharge Observations: Pt still with BUE ataxia and tremors noted.  Mobility  Bed Mobility Bed Mobility: Rolling Right;Rolling Left;Supine to Sit;Sit to Supine Rolling Right: 6: Modified independent (Device/Increase time) Rolling Left: 6: Modified independent (Device/Increase time) Supine to Sit: 6: Modified independent (Device/Increase time) Sit to Supine: HOB flat;6: Modified independent (Device/Increase time) Transfers Sit to Stand: 6: Modified independent (Device/Increase time)  Trunk/Postural Assessment  Cervical Assessment Cervical Assessment: Exceptions to [facility] (slightly forward head) Thoracic Assessment Thoracic Assessment: Exceptions to [facility] (slight thoracic kyphosis) Lumbar Assessment Lumbar Assessment: Within Functional Limits Postural Control Postural Control: Within Functional Limits  Balance Balance Balance Assessed: Yes Static Standing Balance Static Standing - Balance Support: No upper extremity supported;During functional activity Static Standing - Level of Assistance: 6: Modified independent (Device/Increase time) Dynamic Standing Balance Dynamic Standing - Balance Support: No upper extremity supported;During functional activity Dynamic Standing - Level of Assistance: 6: Modified independent (Device/Increase time) Extremity/Trunk Assessment RUE Assessment RUE Assessment: Exceptions to [facility] RUE Strength RUE Overall Strength  Comments: Pt with premorbid history of shoulder dysfunction.  AROM shoulder flexion 0-100 degrees, limited external rotation as well with shoulder flexion with crepitus noted.  All ohter joints AROM and strength WFLs.  Tremor noted in hand at rest and during functional use.  LUE Assessment LUE Assessment: Exceptions to [facility] (AROM and strength WFLS.  Pt with slight tremor in the LUE as well.  )   See Function Navigator for Current Functional Status.  Julanne Schlueter OTR/L 07/21/2016, 4:54 PM

## 2016-07-22 DIAGNOSIS — I480 Paroxysmal atrial fibrillation: Secondary | ICD-10-CM

## 2016-07-22 DIAGNOSIS — I169 Hypertensive crisis, unspecified: Secondary | ICD-10-CM

## 2016-07-22 MED ORDER — IRBESARTAN 75 MG PO TABS
112.5000 mg | ORAL_TABLET | Freq: Every day | ORAL | Status: DC
  Administered 2016-07-22: 112.5 mg via ORAL
  Filled 2016-07-22: qty 1.5

## 2016-07-22 NOTE — Progress Notes (Signed)
Pt discharged to home with brother. Pt received discharge instructions and all belongings are with the patient at this time.

## 2016-07-22 NOTE — Progress Notes (Signed)
Patient's blood pressure elevated this morning, manual bp 180/100. Patient is asymptomatic. Dr. Posey Pronto was notified of patient's BP with order to give 8am blood pressure medicine. Rechecked BP at 630 and it was 170/100. Dr. Posey Pronto aware. Per MD, Monitor BP till noon before discharge. Report given to day shift rn.
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
## 2016-07-23 MED ORDER — RIVAROXABAN 20 MG PO TABS: 20.0000 mg | ORAL_TABLET | Freq: Every day | ORAL | 0 refills | Status: DC

## 2016-07-24 ENCOUNTER — Encounter: Payer: Self-pay | Admitting: Physical Medicine & Rehabilitation

## 2016-07-25 ENCOUNTER — Ambulatory Visit: Payer: Medicare Other | Attending: Physical Medicine & Rehabilitation | Admitting: *Deleted

## 2016-07-25 ENCOUNTER — Telehealth: Payer: Self-pay | Admitting: *Deleted

## 2016-07-25 ENCOUNTER — Ambulatory Visit: Payer: Medicare Other | Admitting: Speech Pathology

## 2016-07-25 ENCOUNTER — Ambulatory Visit: Payer: Medicare Other | Admitting: Physical Therapy

## 2016-07-25 ENCOUNTER — Encounter: Payer: Self-pay | Admitting: Physical Therapy

## 2016-07-25 ENCOUNTER — Encounter: Payer: Self-pay | Admitting: *Deleted

## 2016-07-25 DIAGNOSIS — R41841 Cognitive communication deficit: Secondary | ICD-10-CM | POA: Diagnosis present

## 2016-07-25 DIAGNOSIS — M6281 Muscle weakness (generalized): Secondary | ICD-10-CM | POA: Diagnosis present

## 2016-07-25 DIAGNOSIS — H53462 Homonymous bilateral field defects, left side: Secondary | ICD-10-CM | POA: Diagnosis present

## 2016-07-25 DIAGNOSIS — R2689 Other abnormalities of gait and mobility: Secondary | ICD-10-CM | POA: Diagnosis present

## 2016-07-25 DIAGNOSIS — R41842 Visuospatial deficit: Secondary | ICD-10-CM

## 2016-07-25 DIAGNOSIS — I69354 Hemiplegia and hemiparesis following cerebral infarction affecting left non-dominant side: Secondary | ICD-10-CM | POA: Diagnosis present

## 2016-07-25 DIAGNOSIS — R278 Other lack of coordination: Secondary | ICD-10-CM | POA: Diagnosis present

## 2016-07-25 DIAGNOSIS — I69318 Other symptoms and signs involving cognitive functions following cerebral infarction: Secondary | ICD-10-CM | POA: Diagnosis present
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
## 2016-07-25 NOTE — Patient Instructions (Signed)
   Cognitive Activities you can do at home:   - [place] (easy level)  - [place]  On your computer, tablet or phone: [email or web address] Neuron[employer] App Memory Match [employer]  Alternating attention, divided attention, organizing, reasoning, processing speed  Get a binder with sections for OT, PT, ST for exercises
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
## 2016-07-25 NOTE — Telephone Encounter (Addendum)
Transitional Care call-1st attempt 11:52 07/24/16- left message                                           2nd attempt to reach Eric Pope- left message to call office 07/27/16 @ 10:38am          3rd attempt to reach patient with no answer.  Encounter will be closed   Transitional call  imcomplete.    1. Are you/is patient experiencing any problems since coming home? Are there any questions regarding any aspect of care? 2. Are there any questions regarding medications administration/dosing? Are meds being taken as prescribed? Patient should review meds with caller to confirm 3. Have there been any falls? 4. Has Home Health been to the house and/or have they contacted you? If not, have you tried to contact them? Can we help you contact them? 5. Are bowels and bladder emptying properly? Are there any unexpected incontinence issues? If applicable, is patient following bowel/bladder programs? 6. Any fevers, problems with breathing, unexpected pain? 7. Are there any skin problems or new areas of breakdown? 8. Has the patient/family member arranged specialty MD follow up (ie cardiology/neurology/renal/surgical/etc)?  Can we help arrange? 9. Does the patient need any other services or support that we can help arrange? 10. Are caregivers following through as expected in assisting the patient? 11. Has the patient quit smoking, drinking alcohol, or using drugs as recommended?  Appointment time 9 am Friday 08/04/16 , arrive by 8:45 to see Dr Letta Pate [address]
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
## 2016-07-28 ENCOUNTER — Ambulatory Visit: Payer: Medicare Other | Admitting: Physical Therapy

## 2016-07-28 ENCOUNTER — Ambulatory Visit: Payer: Medicare Other

## 2016-07-28 VITALS — BP 164/123 | HR 109

## 2016-07-28 DIAGNOSIS — R41841 Cognitive communication deficit: Secondary | ICD-10-CM

## 2016-07-28 DIAGNOSIS — R2689 Other abnormalities of gait and mobility: Secondary | ICD-10-CM

## 2016-07-28 DIAGNOSIS — M6281 Muscle weakness (generalized): Secondary | ICD-10-CM | POA: Diagnosis not present

## 2016-07-28 NOTE — Therapy (Signed)
[facility] [address] [place], [place], [address] Phone: [phone]   Fax:  [phone]  Speech Language Pathology Treatment  Patient Details  Name: Eric Pope MRN: [id number] Date of Birth: Sep 27, 1940 Referring Provider: Dr. Alysia Penna  Encounter Date: 07/28/2016      End of Session - 07/28/16 1446    Visit Number 2   Number of Visits 17   Date for SLP Re-Evaluation 09/22/16   SLP Start Time 0803   SLP Stop Time  0846   SLP Time Calculation (min) 43 min   Activity Tolerance Patient tolerated treatment well      Past Medical History:  Diagnosis Date  . Chronic left shoulder pain   . Hypertension   . Kidney stones   . Lead poisoning   . Nephrolithiasis   . 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 CHOLECYSTECTOMY    . LITHOTRIPSY      There were no vitals filed for this visit.      Subjective Assessment - 07/28/16 0809    Subjective "My brother (drove me today)." Pt was up most of last night, brother was going to go home but realized after last night he cannot do this yet.   Currently in Pain? No/denies               ADULT SLP TREATMENT - 07/28/16 0810      General Information   Behavior/Cognition Cooperative;Pleasant mood     Treatment Provided   Treatment provided Cognitive-Linquistic     Cognitive-Linquistic Treatment   Treatment focused on Cognition   Skilled Treatment "My conceptual medical knowledge is good" - pt stated he went to work yesterday and saw patients. Michela Pitcher his brother is leaving to go back to [place] soon. Throughout session pt talking about significant other and poor decision he made to "break up" with her. Pt demo'd decr'd sustained/selective attention (internal distraction) in 5-picture and 8-picture sequencing tasks. Decr'd intellectual and emergent awareness seen as pt cont'd to work with pictures  without changing order even after SLP told him order was incorrect. Pt also told SLP he went to work yesterday. - SLP told pt he should not be working at this time due to cognitive-lingustic deficits.      Assessment / Recommendations / Plan   Plan Continue with current plan of care     Progression Toward Goals   Progression toward goals Progressing toward goals          SLP Education - 07/28/16 1446    Education provided Yes   Education Details Return to work is not recommended at this time   Person(s) Educated Patient;Caregiver(s)  brother   Methods Explanation   Comprehension Verbalized understanding          SLP Short Term Goals - 07/28/16 1449      SLP SHORT TERM GOAL #1   Title Pt will verbalize 3 cognitive impairment with occasional min A over 3 sessions.    Time 4   Period Weeks   Status On-going     SLP SHORT TERM GOAL #2   Title Pt will solve simple functional reasoning, organization and attention to detail problems with 85% accuracy and occasional min A   Time 4   Period Weeks   Status On-going     SLP SHORT TERM GOAL #3   Title Pt will alternate attention between 2 simple cognitive  linguistic tasks with occasional min A and 80% on each   Time 4   Period Weeks   Status On-going          SLP Long Term Goals - 07/28/16 1449      SLP LONG TERM GOAL #1   Title Pt will ID and correct errors on cognitive linguistic tasks 4/5 errors with occasional min A   Time 8   Period Weeks   Status On-going     SLP LONG TERM GOAL #2   Title Pt will solve moderately complex functional math, reasoning and organization tasksk with 85% accuracy and occasional min A   Time 8   Period Weeks   Status On-going     SLP LONG TERM GOAL #3   Title Pt will alternate attention between 2 moderately complex cognitive linguistic tasks wit 85% on each and occasional min A   Time 8   Period Weeks   Status On-going          Plan - 07/28/16 1447    Clinical Impression  Statement Pt cont to present with moderate cognitive linguistic impairments including significant executive function impairents of error awareness (analyzing), organization, reasoning. Selective and alternating attention also impaired. Reduced attention to detail noticed today as well. SLP recommended pt NOT return to work, as he told me he was at work yesterday. Skilled ST remains necessary to maximize cognitive linguistic skillls for possilble return to independence and safety.   Speech Therapy Frequency 2x / week   Duration --  8 weeks or 17 visits   Treatment/Interventions Compensatory strategies;Functional tasks;Cognitive reorganization;Internal/external aids;SLP instruction and feedback;Patient/family education   Potential to Achieve Goals Fair   Potential Considerations Ability to learn/carryover information   Consulted and Agree with Plan of Care Patient;Family member/caregiver   Family Member Consulted brother [name]      Patient will benefit from skilled therapeutic intervention in order to improve the following deficits and impairments:   Cognitive communication deficit    Problem List Patient Active Problem List   Diagnosis Date Noted  . Hypertensive crisis   . PAF (paroxysmal atrial fibrillation) ([facility])   . 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 Paroxysmal atrial fibrillation ([facility])   . 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 Acute ischemic stroke ([facility])   . CVA (cerebral vascular accident) ([facility]) 07/10/2016  . Nonallopathic lesion of sacroiliac region 01/09/2013  . Greater trochanteric bursitis of left hip 07/09/2012  . Nonallopathic lesion of lumbosacral region 05/07/2012  . 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 Left hip pain 01/10/2012  . Rotator cuff syndrome of right shoulder 11/09/2011  . Bicipital tendinitis 11/09/2011  . Parkinson's disease ([facility]) 11/09/2011  . Hypertension 11/09/2011     [facility] ,[place], [place]  07/28/2016, 2:51 PM  [facility] [address] [place], [place], [address] Phone: [phone]   Fax:  [phone]   Name: Eric Pope MRN: [id number] Date of Birth: 12-18-1940

## 2016-07-28 NOTE — Patient Instructions (Signed)
  Please complete the assigned speech therapy homework prior to your next session and return it to the speech therapist at your next visit.  

## 2016-07-28 NOTE — Therapy (Signed)
[facility] [address] [place], [place], [address] Phone: [phone]   Fax:  [phone]  Physical Therapy Treatment  Patient Details  Name: Eric Pope MRN: [id number] Date of Birth: 05-27-1940 Referring Provider: Dr. Alysia Penna  Encounter Date: 07/28/2016      PT End of Session - 07/28/16 1124    Visit Number 2   Number of Visits 9   Date for PT Re-Evaluation 09/08/16  pt unable to get on PT schedule until 3/22   PT Start Time 0940  Pt added to schedule; PT unaware until after 9:30 scheduled time   PT Stop Time 1000   PT Time Calculation (min) 20 min   Activity Tolerance Other (comment)  session ended due to blood pressure   Behavior During Therapy Restless;Anxious;Impulsive      Past Medical History:  Diagnosis Date  . Chronic left shoulder pain   . Hypertension   . Kidney stones   . Lead poisoning   . Nephrolithiasis   . 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 CHOLECYSTECTOMY    . LITHOTRIPSY      Vitals:   07/28/16 0945  BP: (!) 164/123  Pulse: (!) 109        Subjective Assessment - 07/28/16 0944    Subjective Has many questions regarding exercise and his CVA.  No c/o pain today.   Patient is accompained by: Family member  brother, Eric Pope; lives in [place] & returning at end of week   Pertinent History Rt MCA (parietal) CVA with Left visual field cut; afib; HTN; tremor due to lead poisoning; Parkinsonism   Patient Stated Goals Improve his cognition and safety   Currently in Pain? No/denies     Self Care:  Attempted to re-assess pt's blood pressure manually, but unable due to tremor.  Attempted to reassess by machine second time, but machine registered Error at attempts to reassess.  Pt denies any headache or blurred vision.  Pt does report not taking BP meds today due to rush to get ready to get here for therapy.  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 (TARGET 09/08/16)   Time 4   Period Weeks   Status New     PT LONG TERM GOAL #2   Title Demonstrate decreased fall risk and improved gait/balance with increase FGA  score to >=22/30   Time 4   Period Weeks   Status New     PT LONG TERM GOAL #3   Title Patient able to walk independently in moderately distracting environment while attending to Left visual field to avoid running into/tripping over obstacles.   Time 4   Period Weeks   Status New     PT LONG TERM GOAL #4   Title Patient to demonstrate 10% improvement in distance in 6 MWT with vital signs stable.    Time 4   Period Weeks   Status New               Plan - 07/28/16 1126    Clinical Impression Statement Education provided today on HTN (pt reports not taking medication today) and CVA warning signs.  Unable to complete treatment due to BP 164/123 per machine at beginning of session (attempted several other times to take BP manually and by machine again, but not able to get measurement).  Pt has no c/o, other than feeling that he is emotionally stressed.  Will continue to benefit from skilled PT as described in eval.   Rehab Potential Good    Clinical Impairments Affecting Rehab Potential decr cognition and awareness of deficits   PT Frequency 2x / week   PT Duration 4 weeks   PT Treatment/Interventions ADLs/Self Care Home Management;Gait training;Stair training;Functional mobility training;Therapeutic activities;Therapeutic exercise;Balance training;Neuromuscular re-education;Cognitive remediation;Patient/family education;Visual/perceptual remediation/compensation   PT Next Visit Plan 6MWT; initiate HEP (for LE strength, balance), dual task with walking in minimally distracting environment  Check BP prior to session   Consulted and Agree with Plan of Care Patient      Patient will benefit from skilled therapeutic intervention in order to improve the following deficits and impairments:  Abnormal gait, Decreased balance, Decreased cognition, Decreased safety awareness, Decreased strength, Impaired vision/preception, Postural dysfunction  Visit Diagnosis: Other abnormalities of gait and mobility     Problem List Patient Active Problem List   Diagnosis Date Noted  . Hypertensive crisis   . PAF (paroxysmal atrial fibrillation) ([facility])   . 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 Paroxysmal atrial fibrillation ([facility])   . 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 Acute ischemic stroke ([facility])   . CVA (cerebral vascular accident) ([facility]) 07/10/2016  . Nonallopathic lesion of sacroiliac region 01/09/2013  . Greater trochanteric bursitis of left hip 07/09/2012  . Nonallopathic lesion of lumbosacral region 05/07/2012  . 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 Left hip pain 01/10/2012  . Rotator cuff syndrome of right shoulder 11/09/2011  . Bicipital tendinitis 11/09/2011  . Parkinson's disease ([facility]) 11/09/2011  . Hypertension 11/09/2011    My [place] W. 07/28/2016, 11:29 AM  Frazier Butt., PT  [facility] [address] [place], [place], [address] Phone: [phone]   Fax:  [phone]  Name: Eric Pope MRN: [id number] Date of Birth: 1940-12-21

## 2016-08-01 ENCOUNTER — Ambulatory Visit: Payer: Medicare Other | Admitting: Physical Therapy

## 2016-08-01 ENCOUNTER — Ambulatory Visit: Payer: Medicare Other | Admitting: *Deleted

## 2016-08-01 ENCOUNTER — Encounter: Payer: Self-pay | Admitting: Physical Therapy

## 2016-08-01 ENCOUNTER — Ambulatory Visit: Payer: Medicare Other | Admitting: Occupational Therapy

## 2016-08-01 DIAGNOSIS — M6281 Muscle weakness (generalized): Secondary | ICD-10-CM

## 2016-08-01 DIAGNOSIS — R41842 Visuospatial deficit: Secondary | ICD-10-CM

## 2016-08-01 DIAGNOSIS — R41841 Cognitive communication deficit: Secondary | ICD-10-CM

## 2016-08-01 DIAGNOSIS — I69318 Other symptoms and signs involving cognitive functions following cerebral infarction: Secondary | ICD-10-CM

## 2016-08-01 DIAGNOSIS — R278 Other lack of coordination: Secondary | ICD-10-CM

## 2016-08-01 DIAGNOSIS — R2689 Other abnormalities of gait and mobility: Secondary | ICD-10-CM

## 2016-08-01 DIAGNOSIS — I69354 Hemiplegia and hemiparesis following cerebral infarction affecting left non-dominant side: Secondary | ICD-10-CM

## 2016-08-01 NOTE — Therapy (Signed)
[facility] [address] [place], [place], [address] Phone: [phone]   Fax:  [phone]  Speech Language Pathology Treatment  Patient Details  Name: Eric Pope MRN: [id number] Date of Birth: Jan 18, 1941 Referring Provider: Dr. Alysia Penna  Encounter Date: 08/01/2016      End of Session - 08/01/16 1612    Visit Number 3   Number of Visits 17   Date for SLP Re-Evaluation 09/22/16   SLP Start Time 1445   SLP Stop Time  0258   SLP Time Calculation (min) 45 min   Activity Tolerance Patient tolerated treatment well      Past Medical History:  Diagnosis Date  . Chronic left shoulder pain   . Hypertension   . Kidney stones   . Lead poisoning   . Nephrolithiasis   . 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 CHOLECYSTECTOMY    . LITHOTRIPSY      There were no vitals filed for this visit.      Subjective Assessment - 08/01/16 1603    Subjective I got lost walking to church   Patient is accompained by: Family member  brother, [name]   Currently in Pain? Yes   Pain Score 3    Pain Location Shoulder   Pain Orientation Mid   Pain Descriptors / Indicators Aching   Pain Type Acute pain   Pain Frequency Intermittent               ADULT SLP TREATMENT - 08/01/16 0001      General Information   Behavior/Cognition Alert;Cooperative;Pleasant mood     Treatment Provided   Treatment provided Cognitive-Linquistic     Pain Assessment   Pain Assessment 0-10   Pain Score 3    Pain Location shoulder   Pain Descriptors / Indicators Aching   Pain Intervention(s) Monitored during session     Cognitive-Linquistic Treatment   Treatment focused on Cognition;Patient/family/caregiver education   Skilled Treatment Skilled ST session focused on discussion of what pt's brother [name] is doing for pt, and ways to increase pt's independence with those tasks  while [name] is still here. SLP provided suggestions for when pt awakens at 1AM unable to sleep: writing down thoughts (pt is very task oriented), block thoughts with mental imagery, relaxation techniques. Pt indicates using strategies such as EFT, tapping, meditation tapes, and/or transferring to an "alternate spiritual realm". Pt reports independence with making smoothies, bathing and showering. He reports feeling claustrophobic and disoriented (he reports getting lost on his walk to church this weekend) [name] currently recalls appointments and medications for pt. Pt was encouraged to increase independence with these tasks; SLP recommended acquiring a calendar to write appointments and errands. Pt indicated several times during this session that his "circadian rhythm" is a problematic sequence for him, and that he longs for a relationship. Pt reported taking many natural remedies (Co q 10, melatonin, magnesium). [name] reports "sundowning" behaviors, with tendency for pt to wander. He reports decreased recall of information (making a phone call or relaying information). [name] reports he "keeps delaying" his return to [place], as pt requires close supervision for safety. [name] is currently seeking assistance with this. SLP encouraged him to contact the case manager from his hospitalization for guidance on this level, and/or referral to someone who could assist him in finding home care.      Assessment / Recommendations / Plan   Plan  Continue with current plan of care     Progression Toward Goals   Progression toward goals Progressing toward goals          SLP Education - 08/01/16 1611    Education provided Yes   Education Details work and driving are not recommended at this time. Pt was encouraged to acquire a calendar to facilitate functional recall.   Person(s) Educated Associate Professor)  brother [name]   Methods Explanation;Verbal cues;Handout;Demonstration   Comprehension Verbalized  understanding;Verbal cues required;Need further instruction          SLP Short Term Goals - 08/01/16 1614      SLP SHORT TERM GOAL #1   Title Pt will verbalize 3 cognitive impairment with occasional min A over 3 sessions.    Time 3   Period Weeks   Status On-going     SLP SHORT TERM GOAL #2   Title Pt will solve simple functional reasoning, organization and attention to detail problems with 85% accuracy and occasional min A   Time 3   Period Weeks   Status On-going     SLP SHORT TERM GOAL #3   Title Pt will alternate attention between 2 simple cognitive linguistic tasks with occasional min A and 80% on each   Time 3   Period Weeks   Status On-going          SLP Long Term Goals - 08/01/16 1615      SLP LONG TERM GOAL #1   Title Pt will ID and correct errors on cognitive linguistic tasks 4/5 errors with occasional min A   Time 7   Period Weeks   Status On-going     SLP LONG TERM GOAL #2   Title Pt will solve moderately complex functional math, reasoning and organization tasksk with 85% accuracy and occasional min A   Time 7   Period Weeks   Status On-going     SLP LONG TERM GOAL #3   Title Pt will alternate attention between 2 moderately complex cognitive linguistic tasks wit 85% on each and occasional min A   Time 7   Period Weeks   Status On-going          Plan - 08/01/16 1612    Clinical Impression Statement Pt pleasant and cooperative with unfamiliar therapist. He appears to be very tangential, and perseverative on specific topics (circadian rhythm, need of a relationship, disorientation, claustrohobia). Pt requires max verbal cues to limit verbosity and maintain attention to task. Continued ST intervention is recommended, targeting cognitive linguistic skills for safety and independence, as well as reduced caregiver burden.   Speech Therapy Frequency 2x / week   Duration --  8 weeks, 17 visits   Treatment/Interventions Compensatory strategies;Functional  tasks;Cognitive reorganization;Internal/external aids;SLP instruction and feedback;Patient/family education   Potential to Achieve Goals Fair   Potential Considerations Ability to learn/carryover information;Family/community support;Previous level of function;Cooperation/participation level;Severity of impairments   SLP Home Exercise Plan reviewed   Consulted and Agree with Plan of Care Patient;Family member/caregiver   Family Member Consulted brother [name]      Patient will benefit from skilled therapeutic intervention in order to improve the following deficits and impairments:   Cognitive communication deficit    Problem List Patient Active Problem List   Diagnosis Date Noted  . Hypertensive crisis   . PAF (paroxysmal atrial fibrillation) ([facility])   . 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 Paroxysmal atrial fibrillation ([facility])   .  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 Acute ischemic stroke ([facility])   . CVA (cerebral vascular accident) ([facility]) 07/10/2016  . Nonallopathic lesion of sacroiliac region 01/09/2013  . Greater trochanteric bursitis of left hip 07/09/2012  . Nonallopathic lesion of lumbosacral region 05/07/2012  . 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 Left hip pain 01/10/2012  . Rotator cuff syndrome of right shoulder 11/09/2011  . Bicipital tendinitis 11/09/2011  . Parkinson's disease ([facility]) 11/09/2011  . Hypertension 11/09/2011   Shantai Tiedeman B. [place], MSP, CCC-SLP  Shonna Chock 08/01/2016, 4:16 PM  [facility] [address] [place], [place], [address] Phone: [phone]   Fax:  [phone]   Name: KEDRICK MCNAMEE MRN: [id number] Date of Birth: 1940-06-28

## 2016-08-01 NOTE — Therapy (Signed)
[facility] [address] [place], [place], [address] Phone: [phone]   Fax:  [phone]  Occupational Therapy Treatment  Patient Details  Name: Eric Pope MRN: [id number] Date of Birth: 05-16-1941 Referring Provider: Dr Alysia Penna  Encounter Date: 08/01/2016      OT End of Session - 08/01/16 1615    Visit Number 2   Number of Visits 17  1/10 G   Authorization Type BC/BS Medicare G Code    Authorization - Visit Number 1   Authorization - Number of Visits 10   OT Start Time 1324   OT Stop Time 1400   OT Time Calculation (min) 43 min   Activity Tolerance Patient tolerated treatment well   Behavior During Therapy Impulsive      Past Medical History:  Diagnosis Date  . Chronic left shoulder pain   . Hypertension   . Kidney stones   . Lead poisoning   . Nephrolithiasis   . 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 CHOLECYSTECTOMY    . LITHOTRIPSY      There were no vitals filed for this visit.      Subjective Assessment - 08/01/16 1617    Subjective  Denies pain at rest, shoulder pain with movement   Patient is accompained by: Family member   Patient Stated Goals Pt goals = Improved cognition, visual perception.   Currently in Pain? Yes   Pain Score 3    Pain Location Shoulder   Pain Type Acute pain   Pain Onset More than a month ago   Pain Frequency Intermittent   Aggravating Factors  movement   Pain Relieving Factors inactivity   Multiple Pain Sites No            Treatment: Fine motor coordination activities with bilateral UE's, flipping and dealing cards, picking up coins with large amplitude movements and placing in a container, mod v.c. For techniques and larger amplitude movements. Supine PWR! Up, rock and twist, mod v.c. And demonstration.  Pt demonstrates significant improvement in RUE A/ROM following these  exercises.                   OT Short Term Goals - 07/25/16 1303      OT SHORT TERM GOAL #1   Title Pt will be mod I basic simulated ADL (dressing) all aspects w/o vc's    Time 4   Period Weeks   Status New     OT SHORT TERM GOAL #2   Title Pt will be Mod I memory strategies and implement during OT sessions   Time 4   Period Weeks   Status New     OT SHORT TERM GOAL #3   Title Pt will be Mod I simple snack/meal prep adhering to safety and attending to left wide w/o vc's   Time 4   Period Weeks   Status New           OT Long Term Goals - 07/27/16 1305      OT LONG TERM GOAL #1   Time 8               Plan - 08/01/16 1557    Clinical Impression Statement Pt presents with significant visual perceptual and cognitve deficits. Pt reponded well to supine PWR! exercises in supine and demonstrated improved right shoulder A/ROM after exercises.   Rehab Potential Fair  Clinical Impairments Affecting Rehab Potential Lives alone; Brother to assist for 1 week only. Decreased cognition and decreaesd awareness of deficits   OT Frequency 2x / week   OT Duration 8 weeks   Plan PWR! supine, issue coordination HEP,   Consulted and Agree with Plan of Care Patient;Family member/caregiver      Patient will benefit from skilled therapeutic intervention in order to improve the following deficits and impairments:  Decreased endurance, Impaired vision/preception, Decreased knowledge of precautions, Impaired perceived functional ability, Decreased activity tolerance, Decreased knowledge of use of DME, Decreased strength, Impaired sensation, Decreased balance, Decreased cognition, Decreased range of motion, Decreased coordination, Decreased safety awareness, Impaired UE functional use  Visit Diagnosis: Muscle weakness (generalized)  Other lack of coordination  Other symptoms and signs involving cognitive functions following cerebral infarction  Visuospatial  deficit    Problem List Patient Active Problem List   Diagnosis Date Noted  . Hypertensive crisis   . PAF (paroxysmal atrial fibrillation) ([facility])   . 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 Paroxysmal atrial fibrillation ([facility])   . 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 Acute ischemic stroke ([facility])   . CVA (cerebral vascular accident) ([facility]) 07/10/2016  . Nonallopathic lesion of sacroiliac region 01/09/2013  . Greater trochanteric bursitis of left hip 07/09/2012  . Nonallopathic lesion of lumbosacral region 05/07/2012  . 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 Left hip pain 01/10/2012  . Rotator cuff syndrome of right shoulder 11/09/2011  . Bicipital tendinitis 11/09/2011  . Parkinson's disease ([facility]) 11/09/2011  . Hypertension 11/09/2011    RINE,KATHRYN 08/01/2016, 4:23 PM  [facility] [address] [place], [place], [address] Phone: [phone]   Fax:  [phone]  Name: ALEXANDRO LINE MRN: [id number] Date of Birth: [birth year]

## 2016-08-01 NOTE — Therapy (Signed)
[facility] [facility] [address] [place], [place], [address] Phone: [phone]   Fax:  [phone]  Physical Therapy Treatment  Patient Details  Name: Eric Pope MRN: [id number] Date of Birth: 09-15-1940 Referring Provider: Dr. Alysia Penna  Encounter Date: 08/01/2016      PT End of Session - 08/01/16 1640    Visit Number 3   Number of Visits 9   Date for PT Re-Evaluation 09/08/16  pt unable to get on PT schedule until 3/22   PT Start Time 1400   PT Stop Time 1444   PT Time Calculation (min) 44 min   Equipment Utilized During Treatment Gait belt   Activity Tolerance Patient tolerated treatment well   Behavior During Therapy Restless;Anxious;Impulsive      Past Medical History:  Diagnosis Date  . Chronic left shoulder pain   . Hypertension   . Kidney stones   . Lead poisoning   . Nephrolithiasis   . 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 CHOLECYSTECTOMY    . LITHOTRIPSY      There were no vitals filed for this visit.      Subjective Assessment - 08/01/16 1402    Subjective No falls.    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 Yes   Pain Score 3    Pain Location Shoulder   Pain Orientation Mid   Pain Descriptors / Indicators Aching   Pain Type Acute pain   Pain Frequency Intermittent            [facility] PT Assessment - 08/01/16 1400      6 Minute Walk- Baseline   6 Minute Walk- Baseline yes   BP (mmHg) 158/82   HR (bpm) 62   02 Sat (%RA) 97 %     6 Minute walk- Post Test   6 Minute Walk Post Test yes   BP (mmHg) 144/88   HR (bpm) 88   02 Sat (%RA) 96 %     6 minute walk test results    Aerobic Endurance Distance Walked 1349                           Balance Exercises - 08/01/16 1400      Balance Exercises: Standing   Standing Eyes Opened Wide (BOA);Head turns;Foam/compliant surface;5 reps  crossways on balance beam foam   Rockerboard Anterior/posterior;Lateral;Head turns;Intermittent UE support;Other (comment);10 reps  stabilization, head turns & wt shifts.    Step Ups Forward;Lateral;4 inch;Intermittent UE support             PT Short Term Goals - 07/25/16 2228      PT SHORT TERM GOAL #1   Title see LTGs           PT Long Term Goals - 08/01/16 1640      PT LONG TERM GOAL #1   Title Demonstrate independence in HEP. (TARGET 09/08/16)   Time 4   Period Weeks   Status On-going     PT LONG TERM GOAL #2   Title Demonstrate decreased fall risk and improved gait/balance with increase FGA score to >=22/30   Time 4   Period Weeks  Status On-going     PT LONG TERM GOAL #3   Title Patient able to walk independently in moderately distracting environment while attending to Left visual field to avoid running into/tripping over obstacles.   Time 4   Period Weeks   Status On-going     PT LONG TERM GOAL #4   Title Patient to demonstrate 10% improvement in distance in 6 MWT with vital signs stable.    Time 4   Period Weeks   Status On-going               Plan - 08/01/16 1641    Clinical Impression Statement Patient is impulsive requiring cueing for safety and reorient to tasks often. He needed tactile cues for balance reactions on compliant surfaces.    Rehab Potential Good   Clinical Impairments Affecting Rehab Potential decr cognition and awareness of deficits   PT Frequency 2x / week   PT Duration 4 weeks   PT Treatment/Interventions ADLs/Self Care Home Management;Gait training;Stair training;Functional mobility training;Therapeutic activities;Therapeutic exercise;Balance training;Neuromuscular re-education;Cognitive remediation;Patient/family  education;Visual/perceptual remediation/compensation   PT Next Visit Plan initiate HEP (for LE strength, balance), dual task with walking in minimally distracting environment  Check BP prior to session   Consulted and Agree with Plan of Care Patient   Family Member Consulted brother, [name]      Patient will benefit from skilled therapeutic intervention in order to improve the following deficits and impairments:  Abnormal gait, Decreased balance, Decreased cognition, Decreased safety awareness, Decreased strength, Impaired vision/preception, Postural dysfunction  Visit Diagnosis: Muscle weakness (generalized)  Other lack of coordination  Other abnormalities of gait and mobility  Hemiplegia and hemiparesis following cerebral infarction affecting left non-dominant side [facility])     Problem List Patient Active Problem List   Diagnosis Date Noted  . Hypertensive crisis   . PAF (paroxysmal atrial fibrillation) ([place])   . 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 Paroxysmal atrial fibrillation (HCC)   . 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 Acute ischemic stroke ([place])   . CVA (cerebral vascular accident) ([place]) 07/10/2016  . Nonallopathic lesion of sacroiliac region 01/09/2013  . Greater trochanteric bursitis of left hip 07/09/2012  . Nonallopathic lesion of lumbosacral region 05/07/2012  . 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 Left hip pain 01/10/2012  . Rotator cuff syndrome of right shoulder 11/09/2011  . Bicipital tendinitis 11/09/2011  . Parkinson's disease ([place]) 11/09/2011  . Hypertension 11/09/2011    Lindsay Straka PT, DPT 08/01/2016, 4:44 PM  [facility] [address] [place], [place], [address] Phone: [phone]   Fax:  [phone]  Name: Eric Pope MRN: [id number] Date of Birth: 1940/12/26

## 2016-08-02 ENCOUNTER — Ambulatory Visit: Payer: Medicare Other

## 2016-08-02 ENCOUNTER — Ambulatory Visit: Payer: Medicare Other | Admitting: Physical Therapy

## 2016-08-02 ENCOUNTER — Ambulatory Visit: Payer: Medicare Other | Admitting: Occupational Therapy

## 2016-08-02 ENCOUNTER — Encounter: Payer: Self-pay | Admitting: Physical Therapy

## 2016-08-02 DIAGNOSIS — M6281 Muscle weakness (generalized): Secondary | ICD-10-CM | POA: Diagnosis not present

## 2016-08-02 DIAGNOSIS — R2689 Other abnormalities of gait and mobility: Secondary | ICD-10-CM

## 2016-08-02 DIAGNOSIS — I69354 Hemiplegia and hemiparesis following cerebral infarction affecting left non-dominant side: Secondary | ICD-10-CM

## 2016-08-02 DIAGNOSIS — R41841 Cognitive communication deficit: Secondary | ICD-10-CM

## 2016-08-02 DIAGNOSIS — R278 Other lack of coordination: Secondary | ICD-10-CM

## 2016-08-02 DIAGNOSIS — I69318 Other symptoms and signs involving cognitive functions following cerebral infarction: Secondary | ICD-10-CM

## 2016-08-02 DIAGNOSIS — R41842 Visuospatial deficit: Secondary | ICD-10-CM

## 2016-08-02 NOTE — Therapy (Signed)
[facility] [address] [place], [place], [address] Phone: [phone]   Fax:  [phone]  Occupational Therapy Treatment  Patient Details  Name: Eric Pope MRN: [id number] Date of Birth: 06/18/1940 Referring Provider: Dr Alysia Penna  Encounter Date: 08/02/2016      OT End of Session - 08/02/16 1326    Visit Number 3   Number of Visits 19   Date for OT Re-Evaluation 09/30/16   Authorization Type BC/BS Medicare G Code    Authorization Time Period 08/02/16-09/30/16   Authorization - Visit Number 3   Authorization - Number of Visits 10   OT Start Time 0160   OT Stop Time 1400   OT Time Calculation (min) 42 min   Activity Tolerance Patient tolerated treatment well   Behavior During Therapy Restless;Impulsive      Past Medical History:  Diagnosis Date  . Chronic left shoulder pain   . Hypertension   . Kidney stones   . Lead poisoning   . Nephrolithiasis   . 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 CHOLECYSTECTOMY    . LITHOTRIPSY      There were no vitals filed for this visit.      Subjective Assessment - 08/02/16 1325    Subjective  Denies pain at rest, shoulder pain with movement   Patient Stated Goals Pt goals = Improved cognition, visual perception.   Currently in Pain? Yes   Pain Score 3    Pain Location Shoulder   Pain Descriptors / Indicators Aching   Pain Type Acute pain   Pain Onset More than a month ago   Pain Frequency Intermittent   Aggravating Factors  movement   Pain Relieving Factors inactivity   Multiple Pain Sites No            [facility] OT Assessment - 08/02/16 1639      ROM / Strength   AROM / PROM / Strength AROM     AROM   Overall AROM  Deficits   Overall AROM Comments right shoulder flexion 75*, elbow extension -15, supination 75, LUE shoulder flexion 128, supinationa nd elbow ext WFLS.         Treatment; tabletop scanning 1.5 M with 87% accuracy in a busy environment, increased time required, pt started scanning from right side.                  OT Education - 08/02/16 1637    Education provided Yes   Education Details PWR! up, rock and twist in supine, importance of organized sann pattern for tabletop scanning, recommended pt start at left margin   Person(s) Educated Patient;Caregiver(s)   Methods Explanation;Demonstration;Verbal cues;Handout   Comprehension Verbalized understanding;Returned demonstration;Verbal cues required          OT Short Term Goals - 08/02/16 1625      OT SHORT TERM GOAL #1   Title Pt will be mod I basic simulated ADL (dressing) all aspects w/o vc's    Time 4   Period Weeks   Status On-going     OT SHORT TERM GOAL #2   Title Pt will be Mod I memory strategies and implement during OT sessions   Time 4   Period Weeks   Status On-going     OT SHORT TERM GOAL #3   Title Pt will be Mod I simple snack/meal prep adhering to safety and attending to  left side with min v.c.   Time 4   Period Weeks   Status Revised     OT SHORT TERM GOAL #4   Title Pt will perform mod complex tabletop scanning activities with  95% or better accuracy.   Baseline 87% for basic 1.5 M number canellation.   Time 4   Period Weeks   Status New     OT SHORT TERM GOAL #5   Title Pt will verbalize understanding of compensatory strategies for visual impairment.   Time 4   Period Weeks   Status New     OT SHORT TERM GOAL #6   Title i with initial HEP.   Time 4   Period Weeks   Status New           OT Long Term Goals - 08/02/16 1630      OT LONG TERM GOAL #1   Title Pt will navigate a busy environment and perform environmental scanning with 905 or better accuracy.   Time 8   Period Weeks   Status Revised     OT LONG TERM GOAL #2   Title Pt will be Mod I higher level cognitive tasks related to finances (bill pay and balancing a check  book).   Time 8   Period Weeks   Status New     OT LONG TERM GOAL #3   Title Pt will demonstrate improved coordination on bilaterally  as seen by improved 9 Hold Peg score by 10 seconds in prep for functional use.   Baseline 07/25/16 = Left 49.03 seconds affected side (R = 108.84 seconds)    Time 8   Period Weeks   Status Revised     OT LONG TERM GOAL #4   Title Pt will be Mod I simple meal prep (fry and egg, make coffee) on stovetop while maintaining safety    Time 8   Period Weeks   Status On-going     OT LONG TERM GOAL #5   Title Pt will demonstrate ability to retrieve a lightweight object at 85* shoulder flexion with -10 elbow extension with his RUE.   Time 8   Period Weeks   Status New     Long Term Additional Goals   Additional Long Term Goals Yes     OT LONG TERM GOAL #6   Title Pt will demonstrate ability to perform a physical and cognitive task simultaneously with 85% or better accuracy.   Time 8   Period Weeks   Status New               Plan - 08/02/16 1619    Clinical Impression Statement Pt s/p RCVA 07/09/16 with hix of Parkinsonism demonstrates significant visual perceptual and cognitive deficits from recent CVA and significant rigidity, tremor and bradykinesia from Parkinsonism, which impacts primarily RUE. Pt can benefit from skilled occupational therapy to maximize safety and independence with ADLs, IADLS. Therapsit has recommended that pt does not work at this time due to the severity of his deficits.   Rehab Potential Fair   Clinical Impairments Affecting Rehab Potential Lives alone; Brother to assist for several  weeks only, 24 hr supervision recommended for safety, bother is looking into options, Decreased cognition and decreased awareness of deficits   OT Frequency 2x / week   OT Duration 8 weeks   OT Treatment/Interventions Self-care/ADL training;Therapeutic exercise;Patient/family education;Neuromuscular education;Balance training;Therapeutic  exercises;DME and/or AE instruction;Therapeutic activities;Cognitive remediation/compensation;Visual/perceptual remediation/compensation   Plan check on PWR! supine, environmental  scanning or coordination HEP.   Consulted and Agree with Plan of Care Patient;Family member/caregiver   Family Member Consulted Brother - [name]      Patient will benefit from skilled therapeutic intervention in order to improve the following deficits and impairments:  Decreased endurance, Impaired vision/preception, Decreased knowledge of precautions, Impaired perceived functional ability, Decreased activity tolerance, Decreased knowledge of use of DME, Decreased strength, Impaired sensation, Decreased balance, Decreased cognition, Decreased range of motion, Decreased coordination, Decreased safety awareness, Impaired UE functional use  Visit Diagnosis: Muscle weakness (generalized) - Plan: Ot plan of care cert/re-cert  Other lack of coordination - Plan: Ot plan of care cert/re-cert  Other abnormalities of gait and mobility - Plan: Ot plan of care cert/re-cert  Hemiplegia and hemiparesis following cerebral infarction affecting left non-dominant side ([place]) - Plan: Ot plan of care cert/re-cert  Visuospatial deficit - Plan: Ot plan of care cert/re-cert  Other symptoms and signs involving cognitive functions following cerebral infarction - Plan: Ot plan of care cert/re-cert    Problem List Patient Active Problem List   Diagnosis Date Noted  . Hypertensive crisis   . PAF (paroxysmal atrial fibrillation) ([place])   . 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 Paroxysmal atrial fibrillation (H[facility])   . 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 Acute ischemic stroke ([place])   . CVA (cerebral vascular accident) ([place]) 07/10/2016  . Nonallopathic lesion of sacroiliac region 01/09/2013  . Greater trochanteric bursitis of left hip 07/09/2012  . Nonallopathic  lesion of lumbosacral region 05/07/2012  . 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 Left hip pain 01/10/2012  . Rotator cuff syndrome of right shoulder 11/09/2011  . Bicipital tendinitis 11/09/2011  . Parkinson's disease ([place]) 11/09/2011  . Hypertension 11/09/2011    RINE,KATHRYN 08/02/2016, 4:45 PM [name], OTR/L [phone] Phone: [phone] 4:45 PM 08/02/16 [facility] [address] [place], [place], [address] Phone: [phone]   Fax:  [phone]  Name: Eric Pope MRN: [id number] Date of Birth: 12/18/1940

## 2016-08-02 NOTE — Therapy (Signed)
[facility] [address] [place], [place], [address] Phone: [phone]   Fax:  [phone]  Speech Language Pathology Treatment  Patient Details  Name: Eric Pope MRN: [id number] Date of Birth: Dec 25, 1940 Referring Provider: Dr. Alysia Penna  Encounter Date: 08/02/2016      End of Session - 08/02/16 1718    Visit Number 4   Number of Visits 51   SLP Start Time 1027   SLP Stop Time  2536   SLP Time Calculation (min) 42 min   Activity Tolerance Patient tolerated treatment well      Past Medical History:  Diagnosis Date  . Chronic left shoulder pain   . Hypertension   . Kidney stones   . Lead poisoning   . Nephrolithiasis   . 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 CHOLECYSTECTOMY    . LITHOTRIPSY      There were no vitals filed for this visit.      Subjective Assessment - 08/02/16 1452    Subjective Seeing SLP at copier, pt walked into copy room and asked if I was ready for pt to enter to begin ST.   Patient is accompained by: Family member  [name], brother               ADULT SLP TREATMENT - 08/02/16 1453      General Information   Behavior/Cognition Alert;Cooperative;Pleasant mood;Confused;Impulsive;Distractible     Treatment Provided   Treatment provided Cognitive-Linquistic     Cognitive-Linquistic Treatment   Treatment focused on Cognition   Skilled Treatment Pt correctly ID'd number of coins requested ("how many dimes?"), in a group of 15 90%, with repeats requested usually (60%). In producing coin combinations for change totals under $1, pt demonstrated comprehension 80%, with request for repeats 100% of the time, demonstrating decr'd auditory attention. With combinations above $1 requiring >6 coins pt req'd min A consistently. SLP reiterated SLP yesterday's encouragement to obtain a calendar to organize appointments. Pt  states he is keeping track of his own meds. SLP told pt he needs to think about incr'd organization at this time due to decr'd cognitive-linguistics.     Assessment / Recommendations / Plan   Plan Continue with current plan of care     Progression Toward Goals   Progression toward goals Progressing toward goals          SLP Education - 08/02/16 1718    Education provided Yes   Education Details home tasks for cognitive-linguistics   Person(s) Educated Patient;Caregiver(s)   Methods Explanation;Demonstration   Comprehension Verbalized understanding;Need further instruction  pt requires further instruction          SLP Short Term Goals - 08/02/16 1721      SLP SHORT TERM GOAL #1   Title Pt will verbalize 3 cognitive impairment with occasional min A over 3 sessions.    Time 3   Period Weeks   Status On-going     SLP SHORT TERM GOAL #2   Title Pt will solve simple functional reasoning, organization and attention to detail problems with 85% accuracy and occasional min A   Time 3   Period Weeks   Status On-going     SLP SHORT TERM GOAL #3   Title Pt will demo selective attention for mod complex task for 5 minutes in quiet environment with rare min A back to task   Time 3  Period Weeks   Status Revised          SLP Long Term Goals - 08/02/16 1721      SLP LONG TERM GOAL #1   Title Pt will ID and correct errors on cognitive linguistic tasks 4/5 errors with occasional min A   Time 7   Period Weeks   Status On-going     SLP LONG TERM GOAL #2   Title Pt will solve moderately complex functional math, reasoning and organization tasksk with 85% accuracy and occasional min A   Time 7   Period Weeks   Status On-going     SLP LONG TERM GOAL #3   Title Pt will alternate attention between 2 moderately complex cognitive linguistic tasks wit 85% on each and occasional min A   Time 7   Period Weeks   Status On-going          Plan - 08/02/16 1537    Clinical  Impression Statement Pt less tangential and perseverative on specific topics than at eval. Pt cont to require verbal cues to limit verbosity and maintain attention to task. Continued ST intervention is recommended, targeting cognitive linguistic skills for safety and independence, as well as reduced caregiver burden.   Speech Therapy Frequency 2x / week   Duration --  8 weeks   Treatment/Interventions Compensatory strategies;Functional tasks;Cognitive reorganization;Internal/external aids;SLP instruction and feedback;Patient/family education   Potential to Achieve Goals Fair   Potential Considerations Ability to learn/carryover information;Family/community support;Previous level of function;Cooperation/participation level;Severity of impairments      Patient will benefit from skilled therapeutic intervention in order to improve the following deficits and impairments:   Cognitive communication deficit    Problem List Patient Active Problem List   Diagnosis Date Noted  . Hypertensive crisis   . PAF (paroxysmal atrial fibrillation) ([place])   . 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 Paroxysmal atrial fibrillation ([facility])   . 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 Acute ischemic stroke ([place])   . CVA (cerebral vascular accident) ([place]) 07/10/2016  . Nonallopathic lesion of sacroiliac region 01/09/2013  . Greater trochanteric bursitis of left hip 07/09/2012  . Nonallopathic lesion of lumbosacral region 05/07/2012  . 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 Left hip pain 01/10/2012  . Rotator cuff syndrome of right shoulder 11/09/2011  . Bicipital tendinitis 11/09/2011  . Parkinson's disease ([place]) 11/09/2011  . Hypertension 11/09/2011    [facility] ,[place], [place]  08/02/2016, 5:24 PM  [facility] [address] [place], [place], [address] Phone: [phone]    Fax:  [phone]   Name: BRAXSON HOLLINGSWORTH MRN: [id number] Date of Birth: 06-Mar-1941
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
## 2016-08-04 ENCOUNTER — Ambulatory Visit (INDEPENDENT_AMBULATORY_CARE_PROVIDER_SITE_OTHER): Payer: Medicare Other | Admitting: Physician Assistant

## 2016-08-04 ENCOUNTER — Encounter: Payer: Self-pay | Admitting: Physical Medicine & Rehabilitation

## 2016-08-04 ENCOUNTER — Ambulatory Visit (HOSPITAL_BASED_OUTPATIENT_CLINIC_OR_DEPARTMENT_OTHER): Payer: Medicare Other | Admitting: Physical Medicine & Rehabilitation

## 2016-08-04 ENCOUNTER — Encounter: Payer: Medicare Other | Attending: Physical Medicine & Rehabilitation

## 2016-08-04 VITALS — BP 175/98 | HR 62

## 2016-08-04 VITALS — BP 160/99 | HR 72 | Ht 68.0 in | Wt 185.0 lb

## 2016-08-04 DIAGNOSIS — I10 Essential (primary) hypertension: Secondary | ICD-10-CM

## 2016-08-04 DIAGNOSIS — I69319 Unspecified symptoms and signs involving cognitive functions following cerebral infarction: Secondary | ICD-10-CM

## 2016-08-04 DIAGNOSIS — G20 Parkinson's disease: Secondary | ICD-10-CM | POA: Diagnosis not present

## 2016-08-04 DIAGNOSIS — R269 Unspecified abnormalities of gait and mobility: Secondary | ICD-10-CM

## 2016-08-04 DIAGNOSIS — R414 Neurologic neglect syndrome: Secondary | ICD-10-CM | POA: Diagnosis not present

## 2016-08-04 DIAGNOSIS — I69398 Other sequelae of cerebral infarction: Secondary | ICD-10-CM | POA: Insufficient documentation

## 2016-08-04 DIAGNOSIS — I63511 Cerebral infarction due to unspecified occlusion or stenosis of right middle cerebral artery: Secondary | ICD-10-CM

## 2016-08-04 DIAGNOSIS — I4891 Unspecified atrial fibrillation: Secondary | ICD-10-CM | POA: Insufficient documentation

## 2016-08-04 DIAGNOSIS — Z5189 Encounter for other specified aftercare: Secondary | ICD-10-CM | POA: Diagnosis present

## 2016-08-04 DIAGNOSIS — I63521 Cerebral infarction due to unspecified occlusion or stenosis of right anterior cerebral artery: Secondary | ICD-10-CM | POA: Insufficient documentation

## 2016-08-04 DIAGNOSIS — I480 Paroxysmal atrial fibrillation: Secondary | ICD-10-CM

## 2016-08-04 NOTE — Patient Instructions (Signed)
Medication Instructions:   Your physician recommends that you continue on your current medications as directed. Please refer to the Current Medication list given to you today.   If you need a refill on your cardiac medications before your next appointment, please call your pharmacy.  Labwork:  NONE ORDERED  TODAY    Testing/Procedures: NONE ORDERED  TODAY    Follow-Up:  Your physician wants you to follow-up in:  IN  6  MONTHS WITH DR KLEIN  You will receive a reminder letter in the mail two months in advance. If you don't receive a letter, please call our office to schedule the follow-up appointment.      Any Other Special Instructions Will Be Listed Below (If Applicable).                                                                                                                                                   

## 2016-08-04 NOTE — Patient Instructions (Signed)
You have left neglect No driving
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
## 2016-08-07 ENCOUNTER — Ambulatory Visit: Payer: Medicare Other | Admitting: Physical Therapy

## 2016-08-07 ENCOUNTER — Encounter: Payer: Medicare Other | Admitting: Speech Pathology

## 2016-08-07 ENCOUNTER — Encounter: Payer: Self-pay | Admitting: Physical Therapy

## 2016-08-07 VITALS — BP 140/86 | HR 63

## 2016-08-07 DIAGNOSIS — M6281 Muscle weakness (generalized): Secondary | ICD-10-CM | POA: Diagnosis not present

## 2016-08-07 DIAGNOSIS — R2689 Other abnormalities of gait and mobility: Secondary | ICD-10-CM

## 2016-08-07 NOTE — Patient Instructions (Addendum)
Perform these in a corner with chair in front of you:  Feet Together (Compliant Surface) Varied Arm Positions - Eyes Closed    Stand on compliant surface: _pillow/s_ with feet together and arms out. Close eyes and visualize upright position. Hold__30__ seconds. Repeat _3_ times per session. Do _1-2_ sessions per day.  Copyright  VHI. All rights reserved.   Feet Together (Compliant Surface) Head Motion - Eyes Closed    Stand on compliant surface: pillow/s_ with feet together. Close eyes and move head slowly: 1. Up and down x 10 reps 2. Left and right x 10 reps 3. Diagonals both ways x 10 reps each way  Do _1-2_ sessions per day.  Copyright  VHI. All rights reserved.

## 2016-08-08 ENCOUNTER — Ambulatory Visit: Payer: Medicare Other

## 2016-08-08 ENCOUNTER — Ambulatory Visit: Payer: Medicare Other | Admitting: Occupational Therapy

## 2016-08-08 DIAGNOSIS — R41841 Cognitive communication deficit: Secondary | ICD-10-CM

## 2016-08-08 DIAGNOSIS — I69318 Other symptoms and signs involving cognitive functions following cerebral infarction: Secondary | ICD-10-CM

## 2016-08-08 DIAGNOSIS — M6281 Muscle weakness (generalized): Secondary | ICD-10-CM

## 2016-08-08 DIAGNOSIS — R278 Other lack of coordination: Secondary | ICD-10-CM

## 2016-08-08 DIAGNOSIS — R41842 Visuospatial deficit: Secondary | ICD-10-CM

## 2016-08-08 DIAGNOSIS — I69354 Hemiplegia and hemiparesis following cerebral infarction affecting left non-dominant side: Secondary | ICD-10-CM

## 2016-08-08 DIAGNOSIS — R2689 Other abnormalities of gait and mobility: Secondary | ICD-10-CM

## 2016-08-08 NOTE — Therapy (Signed)
[facility] [address] [place], [place], [address] Phone: [phone]   Fax:  [phone]  Speech Language Pathology Treatment  Patient Details  Name: Eric Pope MRN: [id number] Date of Birth: 12/20/1940 Referring Provider: Dr. Alysia Penna  Encounter Date: 08/08/2016      End of Session - 08/08/16 1206    Visit Number 5   Number of Visits 17   Date for SLP Re-Evaluation 09/22/16   SLP Start Time 0805   SLP Stop Time  0846   SLP Time Calculation (min) 41 min   Activity Tolerance Patient tolerated treatment well      Past Medical History:  Diagnosis Date  . Chronic left shoulder pain   . Hypertension   . Kidney stones   . Lead poisoning   . Nephrolithiasis   . 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 CHOLECYSTECTOMY    . LITHOTRIPSY      There were no vitals filed for this visit.      Subjective Assessment - 08/08/16 0812    Subjective "We go straight ahead?" (pt entering wrong ST room). Pt is now living alone.               ADULT SLP TREATMENT - 08/08/16 0813      General Information   Behavior/Cognition Alert;Cooperative;Pleasant mood;Confused;Impulsive;Distractible     Treatment Provided   Treatment provided Cognitive-Linquistic     Cognitive-Linquistic Treatment   Treatment focused on Cognition   Skilled Treatment "My brother has a timer for me - I use it when I use the stove." SLP made sure to educated pt on needing to use reminders at this time and pt agreed. Pt knew he needed a ride so he called an Surveyor, mining for today's appointment, but had Melburn Popper come at 6:30AM for reportedly "a 1/2 hour ride" arriving here at pt reported 7:12 AM. Working memory,mental flexibility, alternating attention - pt req'd minitial mod cues consistently fading to min cues occasionally approx 80% through the task. SLP educated pt re: novel tasks vs. more  rote tasks were going to be more challenging, also educated pt on Parkinson's and decr'd cognitive function.     Assessment / Recommendations / Plan   Plan Continue with current plan of care     Progression Toward Goals   Progression toward goals Progressing toward goals          SLP Education - 08/08/16 1205    Education provided Yes   Education Details use of compensations for cognition, cognitive changes in Parkinson's disease   Person(s) Educated Patient   Methods Explanation   Comprehension Need further instruction;Verbalized understanding          SLP Short Term Goals - 08/08/16 1214      SLP SHORT TERM GOAL #1   Title Pt will verbalize 3 cognitive impairment with occasional min A over 3 sessions.    Time 2   Period Weeks   Status On-going     SLP SHORT TERM GOAL #2   Title Pt will solve simple functional reasoning, organization and attention to detail problems with 85% accuracy and occasional min A   Time 2   Period Weeks   Status On-going     SLP SHORT TERM GOAL #3   Title Pt will demo selective attention for mod complex task for 5 minutes in quiet environment with rare min A back to task  Time 2   Period Weeks   Status Revised          SLP Long Term Goals - 08/08/16 1214      SLP LONG TERM GOAL #1   Title Pt will ID and correct errors on cognitive linguistic tasks 4/5 errors with occasional min A   Time 6   Period Weeks   Status On-going     SLP LONG TERM GOAL #2   Title Pt will solve moderately complex functional math, reasoning and organization tasksk with 85% accuracy and occasional min A   Time 6   Period Weeks   Status On-going     SLP LONG TERM GOAL #3   Title Pt will alternate attention between 2 moderately complex cognitive linguistic tasks wit 85% on each and occasional min A   Time 6   Period Weeks   Status On-going          Plan - 08/08/16 1206    Clinical Impression Statement Pt less tangential and perseverative on his  errors in the card switching task today. He required verbal cues to maintain attention to task, but faded cues were used/necessary. Education today on compensations for reduced attention, executive function. Continued ST intervention is recommended, targeting cognitive linguistic skills for safety and independence, as well as reduced caregiver burden.   Speech Therapy Frequency 2x / week   Duration --  8 weeks   Treatment/Interventions Compensatory strategies;Functional tasks;Cognitive reorganization;Internal/external aids;SLP instruction and feedback;Patient/family education   Potential to Achieve Goals Fair   Potential Considerations Ability to learn/carryover information;Family/community support;Previous level of function;Cooperation/participation level;Severity of impairments      Patient will benefit from skilled therapeutic intervention in order to improve the following deficits and impairments:   Cognitive communication deficit    Problem List Patient Active Problem List   Diagnosis Date Noted  . Gait disturbance, post-stroke 08/04/2016  . Hypertensive crisis   . PAF (paroxysmal atrial fibrillation) ([facility])   . 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 Paroxysmal atrial fibrillation (H[facility])   . 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 Acute ischemic stroke ([place])   . CVA (cerebral vascular accident) ([facility]) 07/10/2016  . Nonallopathic lesion of sacroiliac region 01/09/2013  . Greater trochanteric bursitis of left hip 07/09/2012  . Nonallopathic lesion of lumbosacral region 05/07/2012  . 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 Left hip pain 01/10/2012  . Rotator cuff syndrome of right shoulder 11/09/2011  . Bicipital tendinitis 11/09/2011  . Parkinson's disease ([place]) 11/09/2011  . Hypertension 11/09/2011    [facility] ,[place], [place]  08/08/2016, 12:15 PM  [facility] [address] [place], [place], [address] Phone: [phone]   Fax:  [phone]   Name: JEROL RUFENER MRN: [id number] Date of Birth: 09/23/1940

## 2016-08-08 NOTE — Therapy (Signed)
[facility] [address] [place], [place], [address] Phone: [phone]   Fax:  [phone]  Occupational Therapy Treatment  Patient Details  Name: Eric Pope MRN: [id number] Date of Birth: [birth year] Referring Provider: Dr Alysia Penna  Encounter Date: 08/08/2016      OT End of Session - 08/08/16 1741    Visit Number 4   Number of Visits 19   Date for OT Re-Evaluation 09/30/16   Authorization Type BC/BS Medicare G Code    Authorization Time Period 08/02/16-09/30/16   Authorization - Visit Number 4   Authorization - Number of Visits 10   OT Start Time 1696  1 unit, BP issues   OT Stop Time 0930   OT Time Calculation (min) 43 min   Activity Tolerance Other (comment)  limited by medical isssue      Past Medical History:  Diagnosis Date  . Chronic left shoulder pain   . Hypertension   . Kidney stones   . Lead poisoning   . Nephrolithiasis   . 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 CHOLECYSTECTOMY    . LITHOTRIPSY      There were no vitals filed for this visit.      Subjective Assessment - 08/08/16 0852    Currently in Pain? Yes   Pain Score 4    Pain Location Shoulder   Pain Orientation Right   Pain Descriptors / Indicators Aching   Pain Type Acute pain   Pain Onset More than a month ago   Pain Frequency Intermittent   Aggravating Factors  movements   Pain Relieving Factors resting   Multiple Pain Sites No                 Treatment: Therapist  made pt's ride aware of elevated BP, pt to follow up with MD today. Discussion with pt regarding safety, no driving, no cooking and importance of taking BP meds. Pt verbalized understanding. Pt did not eat breakfast and he was provided with crackers and water.               OT Short Term Goals - 08/02/16 1625      OT SHORT TERM GOAL #1   Title Pt will be mod I basic  simulated ADL (dressing) all aspects w/o vc's    Time 4   Period Weeks   Status On-going     OT SHORT TERM GOAL #2   Title Pt will be Mod I memory strategies and implement during OT sessions   Time 4   Period Weeks   Status On-going     OT SHORT TERM GOAL #3   Title Pt will be Mod I simple snack/meal prep adhering to safety and attending to left side with min v.c.   Time 4   Period Weeks   Status Revised     OT SHORT TERM GOAL #4   Title Pt will perform mod complex tabletop scanning activities with  95% or better accuracy.   Baseline 87% for basic 1.5 M number canellation.   Time 4   Period Weeks   Status New     OT SHORT TERM GOAL #5   Title Pt will verbalize understanding of compensatory strategies for visual impairment.   Time 4   Period Weeks   Status New     OT SHORT TERM GOAL #6   Title i with initial  HEP.   Time 4   Period Weeks   Status New           OT Long Term Goals - 08/02/16 1630      OT LONG TERM GOAL #1   Title Pt will navigate a busy environment and perform environmental scanning with 905 or better accuracy.   Time 8   Period Weeks   Status Revised     OT LONG TERM GOAL #2   Title Pt will be Mod I higher level cognitive tasks related to finances (bill pay and balancing a check book).   Time 8   Period Weeks   Status New     OT LONG TERM GOAL #3   Title Pt will demonstrate improved coordination on bilaterally  as seen by improved 9 Hold Peg score by 10 seconds in prep for functional use.   Baseline 07/25/16 = Left 49.03 seconds affected side (R = 108.84 seconds)    Time 8   Period Weeks   Status Revised     OT LONG TERM GOAL #4   Title Pt will be Mod I simple meal prep (fry and egg, make coffee) on stovetop while maintaining safety    Time 8   Period Weeks   Status On-going     OT LONG TERM GOAL #5   Title Pt will demonstrate ability to retrieve a lightweight object at 85* shoulder flexion with -10 elbow extension with his RUE.   Time  8   Period Weeks   Status New     Long Term Additional Goals   Additional Long Term Goals Yes     OT LONG TERM GOAL #6   Title Pt will demonstrate ability to perform a physical and cognitive task simultaneously with 85% or better accuracy.   Time 8   Period Weeks   Status New               Plan - 08/08/16 1739    Clinical Impression Statement Pt treatment was limited due to elevated BP. Therapsit called pt's MD Dr. Frazier Butt and his office was going to call pt to work him in today. BP was 167/108 at onset of session, exercise withheld.   Clinical Impairments Affecting Rehab Potential Lives alone; Brother to assist for several  weeks only, 24 hr supervision recommended for safety, bother is looking into options, Decreased cognition and decreased awareness of deficits   OT Frequency 2x / week   OT Duration 8 weeks   OT Treatment/Interventions Self-care/ADL training;Therapeutic exercise;Patient/family education;Neuromuscular education;Balance training;Therapeutic exercises;DME and/or AE instruction;Therapeutic activities;Cognitive remediation/compensation;Visual/perceptual remediation/compensation   Plan check BP, progress as able      Patient will benefit from skilled therapeutic intervention in order to improve the following deficits and impairments:  Decreased endurance, Impaired vision/preception, Decreased knowledge of precautions, Impaired perceived functional ability, Decreased activity tolerance, Decreased knowledge of use of DME, Decreased strength, Impaired sensation, Decreased balance, Decreased cognition, Decreased range of motion, Decreased coordination, Decreased safety awareness, Impaired UE functional use  Visit Diagnosis: Muscle weakness (generalized)  Other abnormalities of gait and mobility  Other lack of coordination  Visuospatial deficit  Hemiplegia and hemiparesis following cerebral infarction affecting left non-dominant side (HCC)  Other symptoms and  signs involving cognitive functions following cerebral infarction    Problem List Patient Active Problem List   Diagnosis Date Noted  . Gait disturbance, post-stroke 08/04/2016  . Hypertensive crisis   . PAF (paroxysmal atrial fibrillation) ([facility])   . Right middle cerebral  artery stroke ([place]) 07/14/2016  . 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 Paroxysmal atrial fibrillation ([facility])   . 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 Acute ischemic stroke ([place])   . CVA (cerebral vascular accident) ([facility]) 07/10/2016  . Nonallopathic lesion of sacroiliac region 01/09/2013  . Greater trochanteric bursitis of left hip 07/09/2012  . Nonallopathic lesion of lumbosacral region 05/07/2012  . 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 Left hip pain 01/10/2012  . Rotator cuff syndrome of right shoulder 11/09/2011  . Bicipital tendinitis 11/09/2011  . Parkinson's disease ([place]) 11/09/2011  . Hypertension 11/09/2011    Emit Kuenzel 08/08/2016, 5:42 PM  [facility] [address] [place], [place], [address] Phone: [phone]   Fax:  [phone]  Name: ATIF CHAPPLE MRN: [id number] Date of Birth: [birth year]

## 2016-08-08 NOTE — Therapy (Signed)
[facility] [address] [place], [place], [address] Phone: [phone]   Fax:  [phone]  Physical Therapy Treatment  Patient Details  Name: Eric Pope MRN: [id number] Date of Birth: [birth year] Referring Provider: Dr. Alysia Penna  Encounter Date: 08/07/2016      PT End of Session - 08/07/16 1451    Visit Number 5   Number of Visits 9   Date for PT Re-Evaluation 09/08/16  pt unable to get on PT schedule until 3/22   PT Start Time 1446   PT Stop Time 1530   PT Time Calculation (min) 44 min   Equipment Utilized During Treatment Gait belt   Activity Tolerance Patient tolerated treatment well   Behavior During Therapy Restless;Anxious;Impulsive      Past Medical History:  Diagnosis Date  . Chronic left shoulder pain   . Hypertension   . Kidney stones   . Lead poisoning   . Nephrolithiasis   . 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 CHOLECYSTECTOMY    . LITHOTRIPSY      Vitals:   08/07/16 1448 08/07/16 1512  BP: (!) 175/107- automated machine 140/86- manual recheck  Pulse: 63         Subjective Assessment - 08/07/16 1448    Subjective No new complaints. No falls to report. Having some right shoulder pain (OT addressing)   Pertinent History Rt MCA (parietal) CVA with Left visual field cut; afib; HTN; tremor due to lead poisoning; Parkinsonism   Patient Stated Goals Improve his cognition and safety   Currently in Pain? Yes   Pain Score 1   5-6/10 with movements   Pain Location Shoulder   Pain Orientation Mid;Right   Pain Descriptors / Indicators Aching;Sore   Pain Type Acute pain   Pain Onset More than a month ago   Pain Frequency Intermittent   Aggravating Factors  movements overhead   Pain Relieving Factors resting, not raising arm up      Treatment: Educated on and provided HEP for balance. Issued the following to HEP: Perform  these in a corner with chair in front of you:  Feet Together (Compliant Surface) Varied Arm Positions - Eyes Closed    Stand on compliant surface: _pillow/s_ with feet together and arms out. Close eyes and visualize upright position. Hold__30__ seconds. Repeat _3_ times per session. Do _1-2_ sessions per day.  Copyright  VHI. All rights reserved.   Feet Together (Compliant Surface) Head Motion - Eyes Closed    Stand on compliant surface: pillow/s_ with feet together. Close eyes and move head slowly: 1. Up and down x 10 reps 2. Left and right x 10 reps 3. Diagonals both ways x 10 reps each way  Do _1-2_ sessions per day.  Copyright  VHI. All rights reserved.         PWR [facility]) - 08/07/16 1522    PWR! Up 10   PWR! Rock 10   PWR! Twist 10   PWR Step 10   Comments with counter top behind him and chair in front for safety: multimodal cues needed with each rep on correct posture and ex form/technique.  PT Education - 08/07/16 1523    Education Details HEP: corner balance and standing PWR! exercises   Person(s) Educated Patient   Methods Explanation;Demonstration;Verbal cues;Handout   Comprehension Verbalized understanding;Verbal cues required;Need further instruction          PT Short Term Goals - 07/25/16 2228      PT SHORT TERM GOAL #1   Title see LTGs           PT Long Term Goals - 08/01/16 1640      PT LONG TERM GOAL #1   Title Demonstrate independence in HEP. (TARGET 09/08/16)   Time 4   Period Weeks   Status On-going     PT LONG TERM GOAL #2   Title Demonstrate decreased fall risk and improved gait/balance with increase FGA score to >=22/30   Time 4   Period Weeks   Status On-going     PT LONG TERM GOAL #3   Title Patient able to walk independently in moderately distracting environment while attending to Left visual field to avoid running into/tripping over obstacles.   Time 4   Period Weeks     Status On-going     PT LONG TERM GOAL #4   Title Patient to demonstrate 10% improvement in distance in 6 MWT with vital signs stable.    Time 4   Period Weeks   Status On-going             Plan - 08/07/16 1451    Clinical Impression Statement Today's skilled session focused on establishment of HEP for balance and strengthening. Issued standing PWR! exercises for home today as well. Pt needed moderate cues for correct ex form initially, howvever progressed to minimal cues with repetition. Will need reinforcement to become independent with these concepts. Pt should benefit from continued PT to progress toward unmet goals.    Rehab Potential Good   Clinical Impairments Affecting Rehab Potential decr cognition and awareness of deficits   PT Frequency 2x / week   PT Duration 4 weeks   PT Treatment/Interventions ADLs/Self Care Home Management;Gait training;Stair training;Functional mobility training;Therapeutic activities;Therapeutic exercise;Balance training;Neuromuscular re-education;Cognitive remediation;Patient/family education;Visual/perceptual remediation/compensation   PT Next Visit Plan dual task with walking in minimally distracting environment;review HEP issued 08/07/16  Check BP prior to session   Consulted and Agree with Plan of Care Patient   Family Member Consulted brother, [name]      Patient will benefit from skilled therapeutic intervention in order to improve the following deficits and impairments:  Abnormal gait, Decreased balance, Decreased cognition, Decreased safety awareness, Decreased strength, Impaired vision/preception, Postural dysfunction  Visit Diagnosis: Muscle weakness (generalized)  Other abnormalities of gait and mobility     Problem List Patient Active Problem List   Diagnosis Date Noted  . Gait disturbance, post-stroke 08/04/2016  . Hypertensive crisis   . PAF (paroxysmal atrial fibrillation) ([facility])   . Right middle cerebral artery stroke ([place])  07/14/2016  . 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 Paroxysmal atrial fibrillation (H[facility])   . 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 Acute ischemic stroke ([place])   . CVA (cerebral vascular accident) ([place]) 07/10/2016  . Nonallopathic lesion of sacroiliac region 01/09/2013  . Greater trochanteric bursitis of left hip 07/09/2012  . Nonallopathic lesion of lumbosacral region 05/07/2012  . 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 Left hip pain 01/10/2012  . Rotator cuff syndrome of right shoulder 11/09/2011  . Bicipital tendinitis 11/09/2011  . Parkinson's disease ([place]) 11/09/2011  . Hypertension 11/09/2011  Willow Ora, PTA, [facility] [address] [place], [place] [address] [phone] 08/08/16, 9:23 AM   Name: Eric Pope MRN: [id number] Date of Birth: 12-Jun-1940

## 2016-08-10 ENCOUNTER — Telehealth: Payer: Self-pay | Admitting: Physician Assistant

## 2016-08-10 ENCOUNTER — Ambulatory Visit: Payer: Medicare Other | Admitting: Physical Therapy

## 2016-08-10 ENCOUNTER — Ambulatory Visit: Payer: Medicare Other | Admitting: Speech Pathology

## 2016-08-10 ENCOUNTER — Ambulatory Visit: Payer: Medicare Other | Admitting: Occupational Therapy

## 2016-08-10 ENCOUNTER — Telehealth: Payer: Self-pay | Admitting: Internal Medicine

## 2016-08-10 DIAGNOSIS — R278 Other lack of coordination: Secondary | ICD-10-CM

## 2016-08-10 DIAGNOSIS — R41842 Visuospatial deficit: Secondary | ICD-10-CM

## 2016-08-10 DIAGNOSIS — M6281 Muscle weakness (generalized): Secondary | ICD-10-CM

## 2016-08-10 DIAGNOSIS — R2689 Other abnormalities of gait and mobility: Secondary | ICD-10-CM

## 2016-08-10 NOTE — Therapy (Signed)
[facility] [address] [place], [place], [address] Phone: [phone]   Fax:  [phone]  Patient Details  Name: Eric Pope MRN: [id number] Date of Birth: 01/25/1941 Referring Provider:  Vladimir Creeks, MD  Encounter Date: 08/10/2016 Treatment withheld as pt BP was 170/108. Therapist called Dr. Werner Lean office(PCP). Therapist left message for MD that we would be cx therapies x approx 10 days-2 weeks in order to allow for medication adjustment and reducing BP. Pt is currently staying alone with no supervision. Therapist encouraged pt to go stay with his girlfriend in [place]. Pt verbalized understanding. Therapist reviewed CVA symptoms with pt and instructed him to call 911 if he experiences them.   RINE,KATHRYN 08/10/2016, 4:14 PM  [facility] [address] [place], [place], [address] Phone: [phone]   Fax:  [phone]

## 2016-08-10 NOTE — Telephone Encounter (Signed)
Pradaxa will not "reverse" bleeding and bleeding risk is about the same with Pradaxa and Xarelto.   For nose bleeds recommend pressure as already done by patient. If he has another bleed lasting that long would recommend he see PCP or go to ED as they can cauterize the area to prevent bleeding.

## 2016-08-10 NOTE — Telephone Encounter (Signed)
Pt states that when he got up this morning his nose started to bleed. Pt applied pressure by putting tissue inside his nostril. Pt states he bled  for 2 hours . The bleeding has   stop now. 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 Pt would like to speek with Renee PA about this issue.

## 2016-08-10 NOTE — Telephone Encounter (Signed)
New Message:   Pt's nose just stopped bleeding,very concerned because he is on Xarelto.Please call asap to advise.

## 2016-08-10 NOTE — Telephone Encounter (Signed)
Called the 1st number was not a working number, left a message at mobile number that I was returning the patient's call.    Should he call back, my thoughts are that I agree with Eino Farber, [facility] advice regarding his concerns/nose bleed today.  I would also suggest as we discussed at our visit in the office, given he is s/p acute CVA, recommend since Dr. Leonie Man chose the Xarelto, he should reach out to him to see if he has a preference in medication choice in this post acute stroke phase.  Tommye Standard, PA-C

## 2016-08-11 ENCOUNTER — Ambulatory Visit: Payer: Medicare Other | Admitting: Physical Therapy

## 2016-08-11 NOTE — Therapy (Addendum)
3/23 addendum attached to 3/6 evaluation by mistake--disregard 3/23 entry

## 2016-08-14 ENCOUNTER — Encounter: Payer: Medicare Other | Admitting: Occupational Therapy

## 2016-08-14 ENCOUNTER — Ambulatory Visit: Payer: Medicare Other | Admitting: Physical Therapy

## 2016-08-15 ENCOUNTER — Telehealth: Payer: Self-pay | Admitting: Occupational Therapy

## 2016-08-15 ENCOUNTER — Encounter: Payer: Self-pay | Admitting: Occupational Therapy

## 2016-08-15 NOTE — Therapy (Signed)
[facility] [address] [place], [place], [address] Phone: [phone]   Fax:  [phone]  Patient Details  Name: Eric Pope MRN: [id number] Date of Birth: 03-Nov-1940 Referring Provider:  No ref. provider found  Encounter Date: 08/15/2016   Therapist attempted to contact pt via telephone x 2 and was unable.. Therapist contacted pt's brother [name]-POA, to make sure he had heard from patient. [name] reports he speaks to pt 4-5 x day.  Therapist encouraged pt's brother to see if it is possible to arrange a caregiver to check on pt regarding medications and meals on a regular basis. Therapist relayed that pt's therapy is currently on hold awaiting further BP management. Per pt's brother pt sees his PCP tomorrow. Pt's brother reports that he is considering moving pt to [place] dependent on his progress.  Pt's brother reports he will keep therapy updated regarding his brother.  RINE,KATHRYN 08/15/2016, 5:53 PM [name], OTR/L [phone] Phone: [phone] 5:59 PM 08/15/16 [facility] [address] [place], [place], [address] Phone: [phone]   Fax:  [phone]

## 2016-08-16 ENCOUNTER — Ambulatory Visit: Payer: Medicare Other | Admitting: Physical Therapy

## 2016-08-16 ENCOUNTER — Encounter: Payer: Medicare Other | Admitting: Occupational Therapy

## 2016-08-17 ENCOUNTER — Encounter: Payer: Medicare Other | Admitting: Speech Pathology

## 2016-08-18 NOTE — Telephone Encounter (Signed)
Will forward to Dr. Caryl Comes to review- are you ok with him switching to Pradaxa? 150 mg BIDd?

## 2016-08-19 NOTE — Telephone Encounter (Signed)
Yes. Thanks 

## 2016-08-21 ENCOUNTER — Ambulatory Visit: Payer: Medicare Other | Admitting: Physical Therapy

## 2016-08-21 ENCOUNTER — Encounter: Payer: Medicare Other | Admitting: Speech Pathology

## 2016-08-21 ENCOUNTER — Encounter: Payer: Medicare Other | Admitting: Occupational Therapy

## 2016-08-22 NOTE — Telephone Encounter (Signed)
I spoke with the patient. He is states that he has discussed his blood thinner with Dr. Koleen Distance at [facility]- he recommended a natural replacement for his blood thinner. He is currently taking- nattokinase 100 mg twice daily & CoQ10 twice daily.   I advised I will forward to Dr. Caryl Comes to review.

## 2016-08-22 NOTE — Telephone Encounter (Signed)
I left a message for the patient to call. 

## 2016-08-23 NOTE — Telephone Encounter (Signed)
I have no data with which to support the recommendation of the MD from [facility].   I would recommend ongoing use of his NOAC

## 2016-08-24 ENCOUNTER — Encounter: Payer: Medicare Other | Attending: Psychology | Admitting: Psychology

## 2016-08-24 ENCOUNTER — Telehealth: Payer: Self-pay | Admitting: *Deleted

## 2016-08-24 DIAGNOSIS — I63521 Cerebral infarction due to unspecified occlusion or stenosis of right anterior cerebral artery: Secondary | ICD-10-CM | POA: Insufficient documentation

## 2016-08-24 DIAGNOSIS — I4891 Unspecified atrial fibrillation: Secondary | ICD-10-CM | POA: Diagnosis not present

## 2016-08-24 DIAGNOSIS — G20 Parkinson's disease: Secondary | ICD-10-CM | POA: Insufficient documentation

## 2016-08-24 DIAGNOSIS — I63511 Cerebral infarction due to unspecified occlusion or stenosis of right middle cerebral artery: Secondary | ICD-10-CM

## 2016-08-24 DIAGNOSIS — I69398 Other sequelae of cerebral infarction: Secondary | ICD-10-CM | POA: Insufficient documentation

## 2016-08-24 DIAGNOSIS — Z5189 Encounter for other specified aftercare: Secondary | ICD-10-CM | POA: Insufficient documentation

## 2016-08-24 DIAGNOSIS — R414 Neurologic neglect syndrome: Secondary | ICD-10-CM | POA: Diagnosis not present

## 2016-08-24 DIAGNOSIS — I69319 Unspecified symptoms and signs involving cognitive functions following cerebral infarction: Secondary | ICD-10-CM

## 2016-08-24 NOTE — Telephone Encounter (Signed)
Dr Dahlia Client called and asked for a call back from Dr Sima Matas about an important issue they discussed this morning . Please give him a call.

## 2016-08-25 ENCOUNTER — Encounter: Payer: Self-pay | Admitting: Physical Therapy

## 2016-08-25 ENCOUNTER — Ambulatory Visit: Payer: Medicare Other | Admitting: Occupational Therapy

## 2016-08-25 ENCOUNTER — Ambulatory Visit: Payer: Medicare Other | Attending: Physical Medicine & Rehabilitation | Admitting: Physical Therapy

## 2016-08-25 VITALS — BP 150/80

## 2016-08-25 DIAGNOSIS — H53462 Homonymous bilateral field defects, left side: Secondary | ICD-10-CM | POA: Diagnosis present

## 2016-08-25 DIAGNOSIS — R41842 Visuospatial deficit: Secondary | ICD-10-CM

## 2016-08-25 DIAGNOSIS — R2689 Other abnormalities of gait and mobility: Secondary | ICD-10-CM | POA: Insufficient documentation

## 2016-08-25 DIAGNOSIS — I63511 Cerebral infarction due to unspecified occlusion or stenosis of right middle cerebral artery: Secondary | ICD-10-CM | POA: Diagnosis present

## 2016-08-25 DIAGNOSIS — I69354 Hemiplegia and hemiparesis following cerebral infarction affecting left non-dominant side: Secondary | ICD-10-CM | POA: Diagnosis present

## 2016-08-25 DIAGNOSIS — M6281 Muscle weakness (generalized): Secondary | ICD-10-CM

## 2016-08-25 DIAGNOSIS — R41841 Cognitive communication deficit: Secondary | ICD-10-CM | POA: Diagnosis present

## 2016-08-25 DIAGNOSIS — I69318 Other symptoms and signs involving cognitive functions following cerebral infarction: Secondary | ICD-10-CM | POA: Insufficient documentation

## 2016-08-25 DIAGNOSIS — R278 Other lack of coordination: Secondary | ICD-10-CM | POA: Insufficient documentation

## 2016-08-25 NOTE — Patient Instructions (Signed)
    Reissued handout with written instructions added for clarity

## 2016-08-25 NOTE — Therapy (Signed)
[facility] [address] [place], [place], [address] Phone: [phone]   Fax:  [phone]  Physical Therapy Treatment  Patient Details  Name: Eric Pope MRN: [id number] Date of Birth: 1940/11/26 Referring Provider: Dr. Alysia Penna  Encounter Date: 08/25/2016      PT End of Session - 08/25/16 1611    Visit Number 6  G1    Number of Visits 14  re-cert 4/6 for addt'l 4 weeks   Date for PT Re-Evaluation 24/09/73  re-certification done 4/6   Authorization Time Period 08/25/16 to 10/24/16   PT Start Time 5329  pt working with front desk on schedule and wanted to finish   PT Stop Time 0930   PT Time Calculation (min) 35 min   Equipment Utilized During Treatment Gait belt   Activity Tolerance Patient tolerated treatment well   Behavior During Therapy Restless;Anxious;Impulsive      Past Medical History:  Diagnosis Date  . Chronic left shoulder pain   . Hypertension   . Kidney stones   . Lead poisoning   . Nephrolithiasis   . 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 CHOLECYSTECTOMY    . LITHOTRIPSY      Vitals:   08/25/16 0902  BP: (!) 150/80        Subjective Assessment - 08/25/16 0848    Subjective Patient reports he has been staying by himself and "doing fine." Reports his brother wants him to come to [place] but he has too much going on here to do that.    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 No/denies   Pain Onset More than a month ago                         [facility] Adult PT Treatment/Exercise - 08/25/16 0001      Transfers   Transfers Sit to Stand;Stand to Sit   Sit to Stand 6: Modified independent (Device/Increase time);Without upper extremity assist;From  chair/3-in-1   Comments occasional "leveraging" up using posterior legs against surface; educated to scoot to EOB and risk of tipping/sliding chair when pushing against it     Ambulation/Gait   Ambulation Distance (Feet) 80 Feet   Assistive device None   Gait Pattern Step-through pattern;Decreased arm swing - right;Decreased step length - right;Decreased step length - left;Right flexed knee in stance;Left flexed knee in stance;Decreased trunk rotation;Decreased dorsiflexion - right;Poor foot clearance - left;Poor foot clearance - right   Ambulation Surface Level;Indoor     Posture/Postural Control   Posture/Postural Control Postural limitations   Postural Limitations Rounded Shoulders;Forward head   Posture Comments RUE tremor more pronounced           PWR [facility]) - 08/25/16 1531    PWR! exercises Moves in standing   PWR! Up 10 vc for technique including open hands   PWR! Rock 20 max cues for weightshifting, lengthening trunk, relaxing RUE due to limited ROM and not overreaching   PWR! Twist 20 mod cues for opening arms/chest when moving from side to side   PWR Step 20 max cues for sequencing, open hands, arm position   Comments with counter top behind him and chair in front for safety: multimodal cues needed with  each rep on correct posture and ex form/technique.                                               PT Education - 08/25/16 1611    Education provided Yes   Education Details PWR! stand for HEP   Person(s) Educated Patient   Methods Explanation;Demonstration;Tactile cues;Verbal cues;Handout   Comprehension Verbalized understanding;Returned demonstration;Need further instruction          PT Short Term Goals - 07/25/16 2228      PT SHORT TERM GOAL #1   Title see LTGs           PT Long Term Goals - 08/25/16 1753      PT LONG TERM GOAL #1   Title Demonstrate independence in HEP. (TARGET 09/08/16) New target for all LTG's 09/22/16   Time 4   Period Weeks    Status On-going     PT LONG TERM GOAL #2   Title Demonstrate decreased fall risk and improved gait/balance with increase FGA score to >=22/30   Time 4   Period Weeks   Status On-going     PT LONG TERM GOAL #3   Title Patient able to walk independently in moderately distracting environment while attending to Left visual field to avoid running into/tripping over obstacles.   Time 4   Period Weeks   Status On-going     PT LONG TERM GOAL #4   Title Patient to demonstrate 10% improvement in distance in 6 MWT with vital signs stable.    Time 4   Period Weeks   Status On-going               Plan - 08/25/16 1615    Clinical Impression Statement Patient returned today after being on hold while addressing elevated BPs with his physician. Patient arrived late for therapy due to wanting to work with front desk staff on rearranging some appts. Staff and PT tried to redirect him to attend PT and work with scheduler after session with pt ultimately agreeing to do this. Session focused on re-assessing his HEP (PWR! standing) with patient asking questions repeatedly and requiring repeated cues to correct technique. Handout again provided with cues written in his own words to facilitate carryover to when he is at home. Cognition (attention, awareness, and memory) will continue to be a barrier to carryover from PT sessions to HEP. Anticipate slower progress than initially planned. Patient has missed several appointments during his 4 week course of PT. Plan to extend goals for 4 weeks and re-certify.    Rehab Potential Good   Clinical Impairments Affecting Rehab Potential decr cognition and awareness of deficits   PT Frequency 2x / week   PT Duration 4 weeks   PT Treatment/Interventions ADLs/Self Care Home Management;Gait training;Stair training;Functional mobility training;Therapeutic activities;Therapeutic exercise;Balance training;Neuromuscular re-education;Cognitive remediation;Patient/family  education;Visual/perceptual remediation/compensation   PT Next Visit Plan dual task with walking in minimally distracting environment; review remainder of HEP issued 08/07/16 (only had time to review PWR! standing on 4/6)  Check BP prior to session   Consulted and Agree with Plan of Care Patient      Patient will benefit from skilled therapeutic intervention in order to improve the following deficits and impairments:  Abnormal gait, Decreased balance, Decreased cognition, Decreased safety awareness, Decreased strength, Impaired vision/preception, Postural dysfunction  Visit Diagnosis: Muscle  weakness (generalized) - Plan: PT plan of care cert/re-cert  Other abnormalities of gait and mobility - Plan: PT plan of care cert/re-cert  Right middle cerebral artery stroke [facility]) - Plan: PT plan of care cert/re-cert       G-Codes - September 17, 2016 1756    Functional Assessment Tool Used (Outpatient Only) Clinical judgement    Functional Limitation Mobility: Walking and moving around   Mobility: Walking and Moving Around Current Status ([id number]) At least 20 percent but less than 40 percent impaired, limited or restricted   Mobility: Walking and Moving Around Goal Status [phone]) At least 1 percent but less than 20 percent impaired, limited or restricted      Problem List Patient Active Problem List   Diagnosis Date Noted  . Gait disturbance, post-stroke 08/04/2016  . Hypertensive crisis   . PAF (paroxysmal atrial fibrillation) ([place])   . Right middle cerebral artery stroke ([place]) 07/14/2016  . 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 Paroxysmal atrial fibrillation (HCC)   . 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 Acute ischemic stroke ([place])   . CVA (cerebral vascular accident) ([place]) 07/10/2016  . Nonallopathic lesion of sacroiliac region 01/09/2013  . Greater trochanteric bursitis of left hip 07/09/2012  . Nonallopathic lesion of lumbosacral region 05/07/2012  . Cervical disc herniation  04/17/2012  . Left hip pain 01/10/2012  . Rotator cuff syndrome of right shoulder 11/09/2011  . Bicipital tendinitis 11/09/2011  . Parkinson's disease ([place]) 11/09/2011  . Hypertension 11/09/2011    Rexanne Mano, PT 09-17-16, 6:13 PM  [facility] [address] [place], [place], [address] Phone: [phone]   Fax:  [phone]  Name: Eric Pope MRN: [id number] Date of Birth: 06-16-1940

## 2016-08-25 NOTE — Therapy (Signed)
[facility] [address] [place] Eric Pope, [place], [address] Phone: [phone]   Fax:  [phone]  Occupational Therapy Treatment  Patient Details  Name: Eric Pope MRN: [id number] Date of Birth: 06-Apr-1941 Referring Provider: Dr Eric Pope  Encounter Date: 08/25/2016      OT End of Session - 08/25/16 1309    Visit Number 5   Number of Visits 19   Date for OT Re-Evaluation 09/30/16   Authorization Type BC/BS Medicare G Code    Authorization Time Period 08/02/16-09/30/16   Authorization - Visit Number 5   Authorization - Number of Visits 10   OT Start Time 0935  2 units pt in BR   OT Stop Time 1015   OT Time Calculation (min) 40 min   Activity Tolerance Patient tolerated treatment well   Behavior During Therapy [facility] for tasks assessed/performed      Past Medical History:  Diagnosis Date  . Chronic left shoulder pain   . Hypertension   . Kidney stones   . Lead poisoning   . Nephrolithiasis   . 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 CHOLECYSTECTOMY    . LITHOTRIPSY      There were no vitals filed for this visit.      Subjective Assessment - 08/25/16 1317    Subjective  Pt reports he's been exercising at home   Patient Stated Goals Pt goals = Improved cognition, visual perception.   Currently in Pain? Yes   Pain Score 3    Pain Location Shoulder   Pain Orientation Right   Pain Descriptors / Indicators Aching   Pain Type Chronic pain   Pain Onset More than a month ago   Pain Frequency Intermittent   Aggravating Factors  malpositioning   Pain Relieving Factors repositioning             Treatment: Environmental scanning, pt missed 5/13 items during initial trial, with pt performing task in reverse sequence pt located all items after prompting to slow down and use an organized scan pattern. Arm bike x 5 mins level for conditioning, pt  maintained 35-40 rpm                 OT Education - 08/25/16 1311    Education provided Yes   Education Details PWR! supine for PWR! up, rock, twist   Person(s) Educated Patient   Methods Explanation;Demonstration;Verbal cues   Comprehension Verbalized understanding;Returned demonstration;Verbal cues required          OT Short Term Goals - 08/02/16 1625      OT SHORT TERM GOAL #1   Title Pt will be mod I basic simulated ADL (dressing) all aspects w/o vc's    Time 4   Period Weeks   Status On-going     OT SHORT TERM GOAL #2   Title Pt will be Mod I memory strategies and implement during OT sessions   Time 4   Period Weeks   Status On-going     OT SHORT TERM GOAL #3   Title Pt will be Mod I simple snack/meal prep adhering to safety and attending to left side with min v.c.   Time 4   Period Weeks   Status Revised     OT SHORT TERM GOAL #4   Title Pt will perform mod complex tabletop scanning activities with  95% or better accuracy.   Baseline 87%  for basic 1.5 M number canellation.   Time 4   Period Weeks   Status New     OT SHORT TERM GOAL #5   Title Pt will verbalize understanding of compensatory strategies for visual impairment.   Time 4   Period Weeks   Status New     OT SHORT TERM GOAL #6   Title i with initial HEP.   Time 4   Period Weeks   Status New           OT Long Term Goals - 08/02/16 1630      OT LONG TERM GOAL #1   Title Pt will navigate a busy environment and perform environmental scanning with 905 or better accuracy.   Time 8   Period Weeks   Status Revised     OT LONG TERM GOAL #2   Title Pt will be Mod I higher level cognitive tasks related to finances (bill pay and balancing a check book).   Time 8   Period Weeks   Status New     OT LONG TERM GOAL #3   Title Pt will demonstrate improved coordination on bilaterally  as seen by improved 9 Hold Peg score by 10 seconds in prep for functional use.   Baseline 07/25/16 =  Left 49.03 seconds affected side (R = 108.84 seconds)    Time 8   Period Weeks   Status Revised     OT LONG TERM GOAL #4   Title Pt will be Mod I simple meal prep (fry and egg, make coffee) on stovetop while maintaining safety    Time 8   Period Weeks   Status On-going     OT LONG TERM GOAL #5   Title Pt will demonstrate ability to retrieve a lightweight object at 85* shoulder flexion with -10 elbow extension with his RUE.   Time 8   Period Weeks   Status New     Long Term Additional Goals   Additional Long Term Goals Yes     OT LONG TERM GOAL #6   Title Pt will demonstrate ability to perform a physical and cognitive task simultaneously with 85% or better accuracy.   Time 8   Period Weeks   Status New               Plan - 08/25/16 1307    Clinical Impression Statement Pt arrived today with improved BP(monitored by PT who saw pt prior to OT) Pt is progressing towards goals for HEP. Pt returned demonstration of PWR! exercises in supine .   Rehab Potential Fair   Clinical Impairments Affecting Rehab Potential Lives alone; Brother to assist for several  weeks only, 24 hr supervision recommended for safety, bother is looking into options, Decreased cognition and decreased awareness of deficits   OT Frequency 2x / week   OT Duration 8 weeks   OT Treatment/Interventions Self-care/ADL training;Therapeutic exercise;Patient/family education;Neuromuscular education;Balance training;Therapeutic exercises;DME and/or AE instruction;Therapeutic activities;Cognitive remediation/compensation;Visual/perceptual remediation/compensation   Plan address UE functional use, visual perceptual skills, cognition   Consulted and Agree with Plan of Care Patient;Family member/caregiver   Family Member Consulted Brother - Eric Pope      Patient will benefit from skilled therapeutic intervention in order to improve the following deficits and impairments:  Decreased endurance, Impaired vision/preception,  Decreased knowledge of precautions, Impaired perceived functional ability, Decreased activity tolerance, Decreased knowledge of use of DME, Decreased strength, Impaired sensation, Decreased balance, Decreased cognition, Decreased range of motion, Decreased coordination, Decreased  safety awareness, Impaired UE functional use  Visit Diagnosis: Muscle weakness (generalized)  Other lack of coordination  Visuospatial deficit  Other abnormalities of gait and mobility  Hemiplegia and hemiparesis following cerebral infarction affecting left non-dominant side [facility])    Problem List Patient Active Problem List   Diagnosis Date Noted  . Gait disturbance, post-stroke 08/04/2016  . Hypertensive crisis   . PAF (paroxysmal atrial fibrillation) ([place])   . Right middle cerebral artery stroke ([place]) 07/14/2016  . 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 Paroxysmal atrial fibrillation (HCC)   . 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 Acute ischemic stroke ([place])   . CVA (cerebral vascular accident) ([place]) 07/10/2016  . Nonallopathic lesion of sacroiliac region 01/09/2013  . Greater trochanteric bursitis of left hip 07/09/2012  . Nonallopathic lesion of lumbosacral region 05/07/2012  . 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 Left hip pain 01/10/2012  . Rotator cuff syndrome of right shoulder 11/09/2011  . Bicipital tendinitis 11/09/2011  . Parkinson's disease ([place]) 11/09/2011  . Hypertension 11/09/2011    RINE,KATHRYN 08/25/2016, 1:19 PM  [facility] [address] [place], [place], [address] Phone: [phone]   Fax:  [phone]  Name: Eric Pope MRN: [id number] Date of Birth: August 02, 1940

## 2016-08-28 ENCOUNTER — Ambulatory Visit: Payer: Medicare Other | Admitting: Occupational Therapy

## 2016-08-28 ENCOUNTER — Encounter: Payer: Self-pay | Admitting: Occupational Therapy

## 2016-08-28 ENCOUNTER — Ambulatory Visit: Payer: Medicare Other | Admitting: Physical Therapy

## 2016-08-28 ENCOUNTER — Ambulatory Visit: Payer: Medicare Other

## 2016-08-28 VITALS — BP 183/110

## 2016-08-28 DIAGNOSIS — I69318 Other symptoms and signs involving cognitive functions following cerebral infarction: Secondary | ICD-10-CM

## 2016-08-28 DIAGNOSIS — R2689 Other abnormalities of gait and mobility: Secondary | ICD-10-CM

## 2016-08-28 DIAGNOSIS — R41841 Cognitive communication deficit: Secondary | ICD-10-CM

## 2016-08-28 DIAGNOSIS — H53462 Homonymous bilateral field defects, left side: Secondary | ICD-10-CM

## 2016-08-28 DIAGNOSIS — M6281 Muscle weakness (generalized): Secondary | ICD-10-CM | POA: Diagnosis not present

## 2016-08-28 DIAGNOSIS — R278 Other lack of coordination: Secondary | ICD-10-CM

## 2016-08-28 DIAGNOSIS — I69354 Hemiplegia and hemiparesis following cerebral infarction affecting left non-dominant side: Secondary | ICD-10-CM

## 2016-08-28 DIAGNOSIS — R41842 Visuospatial deficit: Secondary | ICD-10-CM

## 2016-08-28 NOTE — Therapy (Signed)
[facility] [address] [place], [place], [address] Phone: [phone]   Fax:  [phone]  Physical Therapy Treatment  Patient Details  Name: Eric Pope MRN: [id number] Date of Birth: Sep 01, 1940 Referring Provider: Dr. Alysia Penna  Encounter Date: 08/28/2016      PT End of Session - 08/28/16 1643    Visit Number 7   Number of Visits 14   Date for PT Re-Evaluation 09/22/16   Authorization Time Period 08/25/16 to 10/24/16   PT Start Time 1020   PT Stop Time 1055  Pt had high BP - unable to treat due to diastolic reading > 458   PT Time Calculation (min) 35 min      Past Medical History:  Diagnosis Date  . Chronic left shoulder pain   . Hypertension   . Kidney stones   . Lead poisoning   . Nephrolithiasis   . 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 CHOLECYSTECTOMY    . 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 No/denies        Pt's BP recorded as follows:  203/126 with automatic BP cuff - presumed this to be inaccurate as pt was asymptomatic Recorded manually as 176/120 (very difficult to hear due to tremors)  Obtained another staff member (who is a Psychologist, sport and exercise) to check BP and she recorded 160/100  Recorded again manually as 164/104  Check a final time with automatic BP machine with reading 183/110  PT was withheld today due to high BP; instructed pt to obtain Mycardis medication as soon as possible - he verbalized  understanding                           PT Short Term Goals - 07/25/16 2228      PT SHORT TERM GOAL #1   Title see LTGs           PT Long Term Goals - 08/25/16 1753      PT LONG TERM GOAL #1   Title Demonstrate independence in HEP. (TARGET 09/08/16) New target for all LTG's 09/22/16   Time 4   Period Weeks   Status On-going     PT LONG TERM GOAL #2   Title Demonstrate decreased fall risk and improved gait/balance with increase FGA score to >=22/30   Time 4   Period Weeks   Status On-going     PT LONG TERM GOAL #3   Title Patient able to walk independently in moderately distracting environment while attending to Left visual field to avoid running into/tripping over obstacles.   Time 4   Period Weeks   Status On-going     PT LONG TERM GOAL #4   Title Patient to demonstrate 10% improvement in distance in 6 MWT with vital signs stable.    Time 4   Period Weeks   Status On-going  Plan - 08/28/16 1645    Clinical Impression Statement Pt had high BP readings today with diastolic reading >826:  unable to treat due to HTN:  discussed and educated pt on need to obtain refill on Mycardis medication; he states MD instructed him to double up on this medication and he has received call from [facility] and is awaiting inusrance approval    Rehab Potential Good   Clinical Impairments Affecting Rehab Potential decr cognition and awareness of deficits   PT Frequency 2x / week   PT Duration 4 weeks   PT Treatment/Interventions ADLs/Self Care Home Management;Gait training;Stair training;Functional mobility training;Therapeutic activities;Therapeutic exercise;Balance training;Neuromuscular re-education;Cognitive remediation;Patient/family education;Visual/perceptual remediation/compensation   PT Next Visit Plan dual task with walking in minimally distracting environment; review remainder of HEP issued 08/07/16 (only had time to review PWR! standing on  4/6)   Consulted and Agree with Plan of Care Patient      Patient will benefit from skilled therapeutic intervention in order to improve the following deficits and impairments:  Abnormal gait, Decreased balance, Decreased cognition, Decreased safety awareness, Decreased strength, Impaired vision/preception, Postural dysfunction  Visit Diagnosis: Other abnormalities of gait and mobility     Problem List Patient Active Problem List   Diagnosis Date Noted  . Gait disturbance, post-stroke 08/04/2016  . Hypertensive crisis   . PAF (paroxysmal atrial fibrillation) ([place])   . Right middle cerebral artery stroke ([place]) 07/14/2016  . 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 Paroxysmal atrial fibrillation (HCC)   . 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 Acute ischemic stroke ([place])   . CVA (cerebral vascular accident) ([place]) 07/10/2016  . Nonallopathic lesion of sacroiliac region 01/09/2013  . Greater trochanteric bursitis of left hip 07/09/2012  . Nonallopathic lesion of lumbosacral region 05/07/2012  . 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 Left hip pain 01/10/2012  . Rotator cuff syndrome of right shoulder 11/09/2011  . Bicipital tendinitis 11/09/2011  . Parkinson's disease ([place]) 11/09/2011  . Hypertension 11/09/2011    Alda Lea, PT 08/28/2016, 4:49 PM  [facility] [address] [place], [place], [address] Phone: [phone]   Fax:  [phone]  Name: SYAIR FRICKER MRN: [id number] Date of Birth: [birth year]

## 2016-08-28 NOTE — Therapy (Signed)
[facility] [address] [place], [place], [address] Phone: [phone]   Fax:  [phone]  Occupational Therapy Treatment  Patient Details  Name: Eric Pope MRN: [id number] Date of Birth: 1940-10-02 Referring Provider: Dr Alysia Penna  Encounter Date: 08/28/2016      OT End of Session - 08/28/16 1113    Visit Number 6   Number of Visits 19   Date for OT Re-Evaluation 09/30/16   Authorization Type BC/BS Medicare G Code    Authorization Time Period 08/02/16-09/30/16   Authorization - Visit Number 6   Authorization - Number of Visits 10   OT Start Time 0623   OT Stop Time 1200  pt went to restroom 2x during session   OT Time Calculation (min) 55 min   Activity Tolerance Patient tolerated treatment well   Behavior During Therapy [facility] for tasks assessed/performed      Past Medical History:  Diagnosis Date  . Chronic left shoulder pain   . Hypertension   . Kidney stones   . Lead poisoning   . Nephrolithiasis   . 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 CHOLECYSTECTOMY    . LITHOTRIPSY      There were no vitals filed for this visit.      Subjective Assessment - 08/28/16 1108    Subjective  Pt reports that his BP has been fine at the MD's office.  However, pt reports that he has not taken BP meds in 2-3 days as he ran out and that he had trouble with insurance approving refill (MD doubled dosage per pt).   Patient Stated Goals Pt goals = Improved cognition, visual perception.   Currently in Pain? Yes   Pain Score 6    Pain Location --  shoulder   Pain Orientation Right   Pain Descriptors / Indicators Aching   Pain Type Chronic pain   Pain Onset More than a month ago   Pain Frequency Intermittent   Aggravating Factors  malpositioning, abduction   Pain Relieving Factors repositioning       Supine PWR! Moves (up, rock, twist) with min-mod  cueing for technique, incr movement amplitude.  Pt educated in how Parkinsonism can affect pre-existing R shoulder pain (worsen it) and importance of using proper positioning and normal movement patterns/associated trunk movements to prevent future complications including avoiding hyperext of trunk, proper positioning of shoulder, using large base of support.  Pt verbalized understanding, but will continue to need reinforcement due to cognitive deficits and decr awareness.  PT (prior to OT session) monitored BP (elevated--see PT note).  Strongly recommended pt call to get medication re-filled immediately.  Pt reports difficulty getting medication approved by insurance due to being to soon, but also reports that MD doubled dose.  Pt instructed to call pharmacy to make sure they have updated dosage (which may be interfering with approval).  Pt verbalized understanding.  Pt expresses concerns/frustration with cancellations, co-pay for each therapy (when on different days), and transportation, but reports that front desk is working to try to get OT/PT/ST all on 1 day.  In standing functional step and reach with RUE incorporating wt. Shift and trunk rotation for large amplitude movements (mod cues).  Reaching at mid-level to place small pegs in pegboard with mod cues (to avoid compensating with LUE)/difficulty with coordination.  Mod cueing/difficulty copying design (did not complete due to time constraints).  Recommended pt not perform wall sides as he cannot maintain good positioning with this.  Reviewed recommendation not to box and reasons why for this (due to risk of injury and decr awareness of positioning).  Pt verbalized understanding.                       OT Education - 08/28/16 1216    Education Details Sitting shoulder flex with ball with focus on proper positioning--see pt instructions   Person(s) Educated Patient   Methods Explanation;Demonstration;Verbal  cues;Handout;Tactile cues   Comprehension Verbalized understanding;Returned demonstration;Verbal cues required;Tactile cues required;Need further instruction  mod cues          OT Short Term Goals - 08/28/16 1223      OT SHORT TERM GOAL #1   Title Pt will be mod I basic simulated ADL (dressing) all aspects w/o vc's --check STGs 09/05/16   Time 4   Period Weeks   Status On-going     OT SHORT TERM GOAL #2   Title Pt will be Mod I memory strategies and implement during OT sessions   Time 4   Period Weeks   Status On-going     OT SHORT TERM GOAL #3   Title Pt will be Mod I simple snack/meal prep adhering to safety and attending to left side with min v.c.   Time 4   Period Weeks   Status Revised     OT SHORT TERM GOAL #4   Title Pt will perform mod complex tabletop scanning activities with  95% or better accuracy.   Baseline 87% for basic 1.5 M number canellation.   Time 4   Period Weeks   Status New     OT SHORT TERM GOAL #5   Title Pt will verbalize understanding of compensatory strategies for visual impairment.   Time 4   Period Weeks   Status New     OT SHORT TERM GOAL #6   Title i with initial HEP.   Time 4   Period Weeks   Status New           OT Long Term Goals - 08/02/16 1630      OT LONG TERM GOAL #1   Title Pt will navigate a busy environment and perform environmental scanning with 905 or better accuracy.   Time 8   Period Weeks   Status Revised     OT LONG TERM GOAL #2   Title Pt will be Mod I higher level cognitive tasks related to finances (bill pay and balancing a check book).   Time 8   Period Weeks   Status New     OT LONG TERM GOAL #3   Title Pt will demonstrate improved coordination on bilaterally  as seen by improved 9 Hold Peg score by 10 seconds in prep for functional use.   Baseline 07/25/16 = Left 49.03 seconds affected side (R = 108.84 seconds)    Time 8   Period Weeks   Status Revised     OT LONG TERM GOAL #4   Title Pt will  be Mod I simple meal prep (fry and egg, make coffee) on stovetop while maintaining safety    Time 8   Period Weeks   Status On-going     OT LONG TERM GOAL #5   Title Pt will demonstrate ability to retrieve a lightweight object at 85* shoulder flexion with -10 elbow extension with his RUE.   Time 8  Period Weeks   Status New     Long Term Additional Goals   Additional Long Term Goals Yes     OT LONG TERM GOAL #6   Title Pt will demonstrate ability to perform a physical and cognitive task simultaneously with 85% or better accuracy.   Time 8   Period Weeks   Status New               Plan - 08/28/16 1217    Clinical Impression Statement Pt demo incr R shoulder ROM and decr pain with cueing for proper positioning.  However, pt will need reinforcement due to cognitive deficits and decr awareness.  Emphasized importance of getting BP medication refilled and to tell pharmacy that dosage was incr by MD to ensure they have latest perscription.  Pt verbalized understanding   Rehab Potential Fair   Clinical Impairments Affecting Rehab Potential Lives alone; Brother to assist for several  weeks only, 24 hr supervision recommended for safety, bother is looking into options, Decreased cognition and decreased awareness of deficits   OT Frequency 2x / week   OT Duration 8 weeks   OT Treatment/Interventions Self-care/ADL training;Therapeutic exercise;Patient/family education;Neuromuscular education;Balance training;Therapeutic exercises;DME and/or AE instruction;Therapeutic activities;Cognitive remediation/compensation;Visual/perceptual remediation/compensation   Plan begin addressing STGs (extended due to missed visits); review ball ex in sitting, monitor BP   OT Home Exercise Plan PWR! moves in supine, shoulder flex with ball in sitting 08/28/16   Consulted and Agree with Plan of Care Patient   Family Member [place]      Patient will benefit from skilled therapeutic  intervention in order to improve the following deficits and impairments:  Decreased endurance, Impaired vision/preception, Decreased knowledge of precautions, Impaired perceived functional ability, Decreased activity tolerance, Decreased knowledge of use of DME, Decreased strength, Impaired sensation, Decreased balance, Decreased cognition, Decreased range of motion, Decreased coordination, Decreased safety awareness, Impaired UE functional use  Visit Diagnosis: Hemiplegia and hemiparesis following cerebral infarction affecting left non-dominant side (HCC)  Other abnormalities of gait and mobility  Visuospatial deficit  Other lack of coordination  Muscle weakness (generalized)  Homonymous bilateral field defects, left side  Other symptoms and signs involving cognitive functions following cerebral infarction    Problem List Patient Active Problem List   Diagnosis Date Noted  . Gait disturbance, post-stroke 08/04/2016  . Hypertensive crisis   . PAF (paroxysmal atrial fibrillation) ([place])   . 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 Paroxysmal atrial fibrillation (HCC)   . 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 Acute ischemic stroke ([place])   . CVA (cerebral vascular accident) ([place]) 07/10/2016  . Nonallopathic lesion of sacroiliac region 01/09/2013  . Greater trochanteric bursitis of left hip 07/09/2012  . Nonallopathic lesion of lumbosacral region 05/07/2012  . 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 Left hip pain 01/10/2012  . Rotator cuff syndrome of right shoulder 11/09/2011  . Bicipital tendinitis 11/09/2011  . Parkinson's disease ([place]) 11/09/2011  . Hypertension 11/09/2011    [facility] 08/28/2016, 12:25 PM  [facility] [address] [place], [place], [address] Phone: [phone]   Fax:  [phone]  Name: OLIVER HEITZENRATER MRN:  [id number] Date of Birth: 14-Jun-1940   Vianne Bulls, OTR/L [facility] [address] [place], [place]  [address] [phone] phone [phone] 08/28/16 12:25 PM

## 2016-08-28 NOTE — Patient Instructions (Signed)
   Sit with back away from back of chair.  Hold ball on the sides with both hands.  Start with ball on knees and elbow straight.  Slowly raise with elbows straight.  Do not hyperextend back.  Repeat 10x, 2 times a day.

## 2016-08-29 NOTE — Therapy (Signed)
[facility] [address] [place], [place], [address] Phone: [phone]   Fax:  [phone]  Speech Language Pathology Treatment  Patient Details  Name: Eric Pope MRN: [id number] Date of Birth: May 23, 1940 Referring Provider: Dr. Alysia Penna  Encounter Date: 08/28/2016      End of Session - 08/29/16 1705    Visit Number 6   Number of Visits 17   Date for SLP Re-Evaluation 09/22/16   SLP Start Time 0934   SLP Stop Time  1015   SLP Time Calculation (min) 41 min   Activity Tolerance Patient tolerated treatment well      Past Medical History:  Diagnosis Date  . Chronic left shoulder pain   . Hypertension   . Kidney stones   . Lead poisoning   . Nephrolithiasis   . 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 CHOLECYSTECTOMY    . LITHOTRIPSY      There were no vitals filed for this visit.      Subjective Assessment - 08/28/16 0946    Subjective Pt reports now working with his church for some assistance with supervision.   Currently in Pain? No/denies               ADULT SLP TREATMENT - 08/29/16 0001      General Information   Behavior/Cognition Cooperative;Pleasant mood;Distractible;Decreased sustained attention     Treatment Provided   Treatment provided Cognitive-Linquistic     Cognitive-Linquistic Treatment   Treatment focused on Cognition   Skilled Treatment SLP educated pt on the need to pay closer attention to detailed tasks and suggested tasks to incr/improve sustained/selective attention. SLP was encouraged pt told him that people from the [employer] were helping to keep him safe during the day. SLP exampled how to perform simple word searches which SLP told pt may help his ability to concentrate for longer periods of time.  Pt req'd repetitions of directions/suggestions for completion x4 due to inattention.     Assessment /  Recommendations / Plan   Plan Continue with current plan of care     Progression Toward Goals   Progression toward goals Progressing toward goals          SLP Education - 08/29/16 1704    Education provided Yes   Education Details pt tasks at home to assist in lengthening attention    Person(s) Educated Patient   Methods Explanation;Demonstration   Comprehension Verbalized understanding;Returned demonstration;Verbal cues required;Need further instruction          SLP Short Term Goals - 08/29/16 1710      SLP SHORT TERM GOAL #1   Title Pt will verbalize 3 cognitive impairment with occasional min A over 3 sessions.    Time 1   Period Weeks   Status On-going     SLP SHORT TERM GOAL #2   Title Pt will solve simple functional reasoning, organization and attention to detail problems with 85% accuracy and occasional min A   Time 1   Period Weeks   Status On-going     SLP SHORT TERM GOAL #3   Title Pt will demo selective attention for mod complex task for 5 minutes in quiet environment with rare min A back to task   Time 1   Period Weeks   Status Revised          SLP Long Term Goals - 08/29/16 1710  SLP LONG TERM GOAL #1   Title Pt will ID and correct errors on cognitive linguistic tasks 4/5 errors with occasional min A   Time 5   Period Weeks   Status On-going     SLP LONG TERM GOAL #2   Title Pt will solve moderately complex functional math, reasoning and organization tasksk with 85% accuracy and occasional min A   Time 5   Period Weeks   Status On-going     SLP LONG TERM GOAL #3   Title Pt will alternate attention between 2 moderately complex cognitive linguistic tasks wit 85% on each and occasional min A   Time 5   Period Weeks   Status On-going          Plan - 08/29/16 1705    Clinical Impression Statement Pt required verbal cues to maintain attention to task and thus for comprehension of simple instructions due to decr'd attention. Continued ST  intervention is recommended, targeting cognitive linguistic skills for safety and independence, as well as reduced caregiver burden.   Speech Therapy Frequency 2x / week   Duration --  8 weeks   Treatment/Interventions Compensatory strategies;Functional tasks;Cognitive reorganization;Internal/external aids;SLP instruction and feedback;Patient/family education   Potential to Achieve Goals Fair   Potential Considerations Ability to learn/carryover information;Family/community support;Previous level of function;Cooperation/participation level;Severity of impairments      Patient will benefit from skilled therapeutic intervention in order to improve the following deficits and impairments:   Cognitive communication deficit    Problem List Patient Active Problem List   Diagnosis Date Noted  . Gait disturbance, post-stroke 08/04/2016  . Hypertensive crisis   . PAF (paroxysmal atrial fibrillation) ([place])   . 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 Paroxysmal atrial fibrillation (HCC)   . 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 Acute ischemic stroke ([place])   . CVA (cerebral vascular accident) ([place]) 07/10/2016  . Nonallopathic lesion of sacroiliac region 01/09/2013  . Greater trochanteric bursitis of left hip 07/09/2012  . Nonallopathic lesion of lumbosacral region 05/07/2012  . 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 Left hip pain 01/10/2012  . Rotator cuff syndrome of right shoulder 11/09/2011  . Bicipital tendinitis 11/09/2011  . Parkinson's disease ([place]) 11/09/2011  . Hypertension 11/09/2011    [facility], [place]  08/29/2016, 5:11 PM  [facility] [address] [place], [place], [address] Phone: [phone]   Fax:  [phone]   Name: Eric Pope MRN: [id number] Date of Birth: [birth year]

## 2016-08-29 NOTE — Telephone Encounter (Addendum)
I called and spoke with the patient and notified him of Dr. Olin Pia recommendations regarding resumption of his NOAC. He states that he appreciates the call and respects Dr. Olin Pia opinion, but he is concerned about the use of a NOAC due to the nosebleeds that he had on Xarelto. I advised him that Pradaxa has a reversal agent, which he knew about, but states he heard this was very expensive. I advised him I was unaware of the cost of the agent. He is aware that he is due for a follow up in September with Dr. Caryl Comes- he will discuss further with him at that time. I advised that he could call and make a sooner appointment if he desired to do so. He was grateful for the call back.

## 2016-08-30 ENCOUNTER — Encounter: Payer: Medicare Other | Admitting: Occupational Therapy

## 2016-08-30 ENCOUNTER — Ambulatory Visit: Payer: Medicare Other

## 2016-08-30 ENCOUNTER — Ambulatory Visit: Payer: Medicare Other | Admitting: Physical Therapy

## 2016-08-31 ENCOUNTER — Encounter: Payer: Medicare Other | Admitting: Occupational Therapy

## 2016-08-31 ENCOUNTER — Encounter: Payer: Self-pay | Admitting: Occupational Therapy

## 2016-08-31 NOTE — Therapy (Signed)
[facility] [address] [place], [place], [address] Phone: [phone]   Fax:  [phone]  Patient Details  Name: Eric Pope MRN: [id number] Date of Birth: [birth year] Referring Provider:  No ref. provider found  Encounter Date: 08/31/2016 Therapist contacted pt's [name] today by telephone,  as pt arrived for therapy on Monday 08/28/16 with elevated BP. He reported that he was out of his blood pressure medication and had not taken it for 3 days.(Therapist left a message for pt's brother initally on 08/29/16 but she did not receive a return call). Pt's brother reports that Fritz Pickerel had not conveyed this information to him. He reported he will follow up with his pt's new hired caregiver who is a [employer].  Therapist also made pt's brother aware that pt had cancelled all his remaining therapy appointments until after he sees Dr. Letta Pate and Dr. Leonie Man.  He is  upset about therapy sessions being limited by blood pressure. Therapist also reiterrated that pt continues to demonstrate significant cognitive and visual perceptual deficits which impede his safety and judgement. Pt's brother reports he will followup with pt's hired caregiver.  RINE,KATHRYN 08/31/2016, 6:49 PM [name], OTR/L [phone] Phone: [phone] 7:03 PM 08/31/16 [facility] [address] [place], [place], [address] Phone: [phone]   Fax:  [phone]

## 2016-09-01 ENCOUNTER — Ambulatory Visit: Payer: Medicare Other | Admitting: Physical Medicine & Rehabilitation

## 2016-09-04 ENCOUNTER — Encounter: Payer: Medicare Other | Admitting: Occupational Therapy

## 2016-09-05 ENCOUNTER — Encounter: Payer: Medicare Other | Admitting: Speech Pathology

## 2016-09-05 ENCOUNTER — Encounter: Payer: Medicare Other | Admitting: Occupational Therapy

## 2016-09-05 ENCOUNTER — Ambulatory Visit: Payer: Medicare Other | Admitting: Physical Therapy

## 2016-09-06 ENCOUNTER — Ambulatory Visit: Payer: Medicare Other

## 2016-09-06 ENCOUNTER — Ambulatory Visit: Payer: Medicare Other | Admitting: Occupational Therapy

## 2016-09-06 ENCOUNTER — Telehealth: Payer: Self-pay | Admitting: Emergency Medicine

## 2016-09-06 ENCOUNTER — Other Ambulatory Visit: Payer: Self-pay | Admitting: Emergency Medicine

## 2016-09-06 VITALS — BP 150/100

## 2016-09-06 DIAGNOSIS — R278 Other lack of coordination: Secondary | ICD-10-CM

## 2016-09-06 DIAGNOSIS — R2689 Other abnormalities of gait and mobility: Secondary | ICD-10-CM

## 2016-09-06 DIAGNOSIS — I10 Essential (primary) hypertension: Secondary | ICD-10-CM

## 2016-09-06 DIAGNOSIS — R41841 Cognitive communication deficit: Secondary | ICD-10-CM

## 2016-09-06 DIAGNOSIS — M6281 Muscle weakness (generalized): Secondary | ICD-10-CM | POA: Diagnosis not present

## 2016-09-06 DIAGNOSIS — I69354 Hemiplegia and hemiparesis following cerebral infarction affecting left non-dominant side: Secondary | ICD-10-CM

## 2016-09-06 DIAGNOSIS — R41842 Visuospatial deficit: Secondary | ICD-10-CM

## 2016-09-06 NOTE — Therapy (Signed)
[facility] [address] [place], [place], [address] Phone: [phone]   Fax:  [phone]  Speech Language Pathology Treatment  Patient Details  Name: Eric Pope MRN: [id number] Date of Birth: July 19, 1940 Referring Provider: Dr. Alysia Penna  Encounter Date: 09/06/2016      End of Session - 09/06/16 1648    Visit Number 7   Number of Visits 17   Date for SLP Re-Evaluation 09/22/16   SLP Start Time 1321   SLP Stop Time  1401   SLP Time Calculation (min) 40 min   Activity Tolerance Patient tolerated treatment well      Past Medical History:  Diagnosis Date  . Chronic left shoulder pain   . Hypertension   . Kidney stones   . Lead poisoning   . Nephrolithiasis   . 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 CHOLECYSTECTOMY    . LITHOTRIPSY      There were no vitals filed for this visit.      Subjective Assessment - 09/06/16 1332    Subjective Pt's brother called to schedule pt's appointments back after pt had called to cancel all remaining appointments. Pt has had consult with neurologist in [facility]) yesterday. Prescribed Rytary and Azilect.    Currently in Pain? No/denies               ADULT SLP TREATMENT - 09/06/16 1339      General Information   Behavior/Cognition Cooperative;Pleasant mood;Decreased sustained attention;Distractible     Treatment Provided   Treatment provided Cognitive-Linquistic     Cognitive-Linquistic Treatment   Treatment focused on Cognition   Skilled Treatment Pt reports he has a history of ADD. Auditory attention task today with 80% success without emergent awareness. Pt routinely repeats lead concerns with Parkinson's during every ST session. Pt reported he believes his cognition is good due to an ability to "think deeply on many things." SLP explained/eduated pt re: nature of attention deficits as  well as awareness and impulsivity. Pt again told SLP the fact that he has cognitive deficts is surprsing to him. SLP provided examples of decr'd executive function surrounding medication administration, emergent awareness with homework and verbal repeats to SLP.      Assessment / Recommendations / Plan   Plan Continue with current plan of care     Progression Toward Goals   Progression toward goals Not progressing toward goals (comment)  error awareness, intellectual awareness          SLP Education - 09/06/16 1648    Education provided Yes   Education Details deficit areas in cognitive-linguistics   Person(s) Educated Patient   Methods Explanation   Comprehension Verbalized understanding;Need further instruction          SLP Short Term Goals - 09/06/16 1650      SLP SHORT TERM GOAL #1   Title Pt will verbalize 3 cognitive impairment with occasional min A over 3 sessions.    Status Not Met     SLP SHORT TERM GOAL #2   Title Pt will solve simple functional reasoning, organization and attention to detail problems with 85% accuracy and occasional min A   Status Not Met     SLP SHORT TERM GOAL #3   Title Pt will demo selective attention for mod complex task for 5 minutes in quiet environment with rare min A back to task   Status  Not Met          SLP Long Term Goals - 09/06/16 1650      SLP LONG TERM GOAL #1   Title Pt will ID errors on cognitive linguistic tasks with occasional min A   Time 4   Period Weeks   Status Revised     SLP LONG TERM GOAL #2   Title Pt will solve sim math, reasoning and organization tasksk with 85% accuracy and occasional min A   Time 4   Period Weeks   Status On-going     SLP LONG TERM GOAL #3   Title Pt will attend to 8 minute mod complex tasks with occasional min A, resulting in at least 90% success   Time 4   Period Weeks   Status Revised          Plan - 09/06/16 1648    Clinical Impression Statement Pt somewhat tangential  and verbose today. No ST goals were met. Req'd cues to maintain attention, cont with decr'd intellectual awareness due to it being "surprising" that he has cognitive deficits. Continued ST intervention is recommended, targeting cognitive linguistic skills for safety and independence, as well as reduced caregiver burden.   Speech Therapy Frequency 2x / week   Duration --  8 weeks   Treatment/Interventions Compensatory strategies;Functional tasks;Cognitive reorganization;Internal/external aids;SLP instruction and feedback;Patient/family education   Potential to Achieve Goals Fair   Potential Considerations Ability to learn/carryover information;Family/community support;Previous level of function;Cooperation/participation level;Severity of impairments      Patient will benefit from skilled therapeutic intervention in order to improve the following deficits and impairments:   Cognitive communication deficit    Problem List Patient Active Problem List   Diagnosis Date Noted  . Gait disturbance, post-stroke 08/04/2016  . Hypertensive crisis   . PAF (paroxysmal atrial fibrillation) ([place])   . 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 Paroxysmal atrial fibrillation (HCC)   . 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 Acute ischemic stroke ([place])   . CVA (cerebral vascular accident) ([place]) 07/10/2016  . Nonallopathic lesion of sacroiliac region 01/09/2013  . Greater trochanteric bursitis of left hip 07/09/2012  . Nonallopathic lesion of lumbosacral region 05/07/2012  . 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 Left hip pain 01/10/2012  . Rotator cuff syndrome of right shoulder 11/09/2011  . Bicipital tendinitis 11/09/2011  . Parkinson's disease ([place]) 11/09/2011  . Hypertension 11/09/2011    [facility] ,[place], [place]  09/06/2016, 4:53 PM  [facility] [address] [place], [place], [address] Phone: [phone]   Fax:  [phone]   Name: Eric Pope MRN: [id number] Date of Birth: 1941-05-03

## 2016-09-06 NOTE — Telephone Encounter (Signed)
Dr. Dahlia Client is concerned that he has pheochromocytoma. He has been having symptoms of headaches, cold hands, paroxysmal hypertension, night sweats, tremors, shortness of breath. He ate licorice and his BP elevated to 240 ( he asked that I specifically write this detail). I informed him that I would forward the message of his symptoms to Dr. Caryl Comes to discuss and that I would order outpatient 24 hour urine metanephrine and 24 hr urine catecholamine. Currently during phone call, he reports his blood pressure and pulse are normal.

## 2016-09-07 ENCOUNTER — Encounter: Payer: Medicare Other | Admitting: Speech Pathology

## 2016-09-07 ENCOUNTER — Telehealth: Payer: Self-pay | Admitting: Internal Medicine

## 2016-09-07 NOTE — Telephone Encounter (Signed)
I left a message for the patient to call. 

## 2016-09-07 NOTE — Telephone Encounter (Signed)
Pt returning our call °Please call back ° °

## 2016-09-07 NOTE — Telephone Encounter (Signed)
I called and spoke with the patient. 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 He was wanting to know where to pick the urine collection kit up from. I advised him he can come here to the office to pick this up between 7:30 am-5:00 pm. He verbalizes understanding.

## 2016-09-07 NOTE — Therapy (Signed)
[facility] [address] [place], [place], [address] Phone: [phone]   Fax:  [phone]  Occupational Therapy Treatment  Patient Details  Name: Eric Pope MRN: [id number] Date of Birth: May 17, 1941 Referring Provider: Dr Alysia Penna  Encounter Date: 09/06/2016      OT End of Session - 09/07/16 1603    Visit Number 7   Number of Visits 19   Date for OT Re-Evaluation 09/30/16   Authorization Type BC/BS Medicare G Code    Authorization Time Period 08/02/16-09/30/16   Authorization - Visit Number 7   Authorization - Number of Visits 10   OT Start Time 7096   OT Stop Time 1445   OT Time Calculation (min) 38 min   Activity Tolerance Patient tolerated treatment well   Behavior During Therapy [facility] for tasks assessed/performed      Past Medical History:  Diagnosis Date  . Chronic left shoulder pain   . Hypertension   . Kidney stones   . Lead poisoning   . Nephrolithiasis   . 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 CHOLECYSTECTOMY    . LITHOTRIPSY      Vitals:   09/07/16 1557  BP: (!) 150/100        Subjective Assessment - 09/07/16 1556    Subjective  Pt reports that his BP has been fine at the MD office   Patient Stated Goals Pt goals = Improved cognition, visual perception.   Currently in Pain? No/denies           Treatment: Simple cooking task to locate all items required for cooking an egg, then pt. performed the task. Min v.c. And increased time to locate an organize items needed for task. Pt cracked the egg into a cold pan and then turned on. Pt required prompting to increase the temperature for cooking as pt started task on low. Pt attempted to adjust the wrong burner. Pt turned off the stove without prompting, however he left the hot pan on the edge of the burner in a precarious position. Pt required v.c to move pan to a back  burner to cool. Pt returned eggs to refrigerator and he put away the oil after prompting. Therapist recommends pt has his caregiver provide  supervision for cooking at home. Pt verbalized understanding                     OT Short Term Goals - 09/07/16 1602      OT SHORT TERM GOAL #1   Title Pt will be mod I basic simulated ADL (dressing) all aspects w/o vc's --check STGs 09/05/16   Time 4   Period Weeks   Status Achieved     OT SHORT TERM GOAL #2   Title Pt will be Mod I memory strategies and implement during OT sessions   Time 4   Period Weeks   Status On-going     OT SHORT TERM GOAL #3   Title Pt will be Mod I simple snack/meal prep adhering to safety and attending to left side with min v.c.   Time 4   Period Weeks   Status On-going     OT SHORT TERM GOAL #4   Title Pt will perform mod complex tabletop scanning activities with  95% or better accuracy.   Time 4   Period Weeks   Status On-going     OT  SHORT TERM GOAL #5   Title Pt will verbalize understanding of compensatory strategies for visual impairment.   Time 4   Period Weeks   Status On-going     OT SHORT TERM GOAL #6   Title i with initial HEP.   Time 4   Status On-going           OT Long Term Goals - 08/02/16 1630      OT LONG TERM GOAL #1   Title Pt will navigate a busy environment and perform environmental scanning with 905 or better accuracy.   Time 8   Period Weeks   Status Revised     OT LONG TERM GOAL #2   Title Pt will be Mod I higher level cognitive tasks related to finances (bill pay and balancing a check book).   Time 8   Period Weeks   Status New     OT LONG TERM GOAL #3   Title Pt will demonstrate improved coordination on bilaterally  as seen by improved 9 Hold Peg score by 10 seconds in prep for functional use.   Baseline 07/25/16 = Left 49.03 seconds affected side (R = 108.84 seconds)    Time 8   Period Weeks   Status Revised     OT LONG TERM GOAL #4   Title Pt  will be Mod I simple meal prep (fry and egg, make coffee) on stovetop while maintaining safety    Time 8   Period Weeks   Status On-going     OT LONG TERM GOAL #5   Title Pt will demonstrate ability to retrieve a lightweight object at 85* shoulder flexion with -10 elbow extension with his RUE.   Time 8   Period Weeks   Status New     Long Term Additional Goals   Additional Long Term Goals Yes     OT LONG TERM GOAL #6   Title Pt will demonstrate ability to perform a physical and cognitive task simultaneously with 85% or better accuracy.   Time 8   Period Weeks   Status New               Plan - 09/07/16 1558    Clinical Impression Statement Pt reports he has been cooking at home and it is going fine. Pt's MD has started him on rytary. Pt continues to demonstrate decreased insight into deficits.   Rehab Potential Fair   OT Frequency 2x / week   OT Duration 8 weeks   OT Treatment/Interventions Self-care/ADL training;Therapeutic exercise;Patient/family education;Neuromuscular education;Balance training;Therapeutic exercises;DME and/or AE instruction;Therapeutic activities;Cognitive remediation/compensation;Visual/perceptual remediation/compensation   Plan check STG's, ball in seated   OT Home Exercise Plan PWR! moves in supine, shoulder flex with ball in sitting 08/28/16      Patient will benefit from skilled therapeutic intervention in order to improve the following deficits and impairments:  Decreased endurance, Impaired vision/preception, Decreased knowledge of precautions, Impaired perceived functional ability, Decreased activity tolerance, Decreased knowledge of use of DME, Decreased strength, Impaired sensation, Decreased balance, Decreased cognition, Decreased range of motion, Decreased coordination, Decreased safety awareness, Impaired UE functional use  Visit Diagnosis: Hemiplegia and hemiparesis following cerebral infarction affecting left non-dominant side  (HCC)  Other abnormalities of gait and mobility  Visuospatial deficit  Other lack of coordination  Muscle weakness (generalized)    Problem List Patient Active Problem List   Diagnosis Date Noted  . Gait disturbance, post-stroke 08/04/2016  . Hypertensive crisis   . PAF (paroxysmal atrial  fibrillation) ([place])   . 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 Paroxysmal atrial fibrillation (HCC)   . 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 Acute ischemic stroke ([place])   . CVA (cerebral vascular accident) ([place]) 07/10/2016  . Nonallopathic lesion of sacroiliac region 01/09/2013  . Greater trochanteric bursitis of left hip 07/09/2012  . Nonallopathic lesion of lumbosacral region 05/07/2012  . 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 Left hip pain 01/10/2012  . Rotator cuff syndrome of right shoulder 11/09/2011  . Bicipital tendinitis 11/09/2011  . Parkinson's disease ([place]) 11/09/2011  . Hypertension 11/09/2011    RINE,KATHRYN 09/07/2016, 4:05 PM  [facility] [address] [place], [place], [address] Phone: [phone]   Fax:  [phone]  Name: Eric Pope MRN: [id number] Date of Birth: 1940/05/25

## 2016-09-07 NOTE — Telephone Encounter (Signed)
New Message ° ° pt verbalized that he is returning call for rn  °

## 2016-09-07 NOTE — Telephone Encounter (Signed)
Ok

## 2016-09-08 ENCOUNTER — Ambulatory Visit: Payer: Medicare Other | Admitting: *Deleted

## 2016-09-08 ENCOUNTER — Ambulatory Visit: Payer: Medicare Other | Admitting: Occupational Therapy

## 2016-09-08 ENCOUNTER — Ambulatory Visit: Payer: Medicare Other | Admitting: Physical Therapy

## 2016-09-08 ENCOUNTER — Encounter: Payer: Self-pay | Admitting: Physical Therapy

## 2016-09-08 VITALS — BP 142/100

## 2016-09-08 VITALS — BP 150/95

## 2016-09-08 DIAGNOSIS — R41842 Visuospatial deficit: Secondary | ICD-10-CM

## 2016-09-08 DIAGNOSIS — R278 Other lack of coordination: Secondary | ICD-10-CM

## 2016-09-08 DIAGNOSIS — R2689 Other abnormalities of gait and mobility: Secondary | ICD-10-CM

## 2016-09-08 DIAGNOSIS — R41841 Cognitive communication deficit: Secondary | ICD-10-CM

## 2016-09-08 DIAGNOSIS — M6281 Muscle weakness (generalized): Secondary | ICD-10-CM

## 2016-09-08 DIAGNOSIS — I69354 Hemiplegia and hemiparesis following cerebral infarction affecting left non-dominant side: Secondary | ICD-10-CM

## 2016-09-08 NOTE — Patient Instructions (Addendum)
Memory Compensation Strategies  1. Use "WARM" strategy.  W= write it down  A= associate it  R= repeat it  M= make a mental note  2.   You can keep a Social worker.  Use a 3-ring notebook with sections for the following: calendar, important names and phone numbers,  medications, doctors' names/phone numbers, lists/reminders, and a section to journal what you did  each day.   3.    Use a calendar to write appointments down.  4.    Write yourself a schedule for the day.  This can be placed on the calendar or in a separate section of the Memory Notebook.  Keeping a  regular schedule can help memory.  5.    Use medication organizer with sections for each day or morning/evening pills.  You may need help loading it  6.    Keep a basket, or pegboard by the door.  Place items that you need to take out with you in the basket or on the pegboard.  You may also want to  include a message board for reminders.  7.    Use sticky notes.  Place sticky notes with reminders in a place where the task is performed.  For example: " turn off the  stove" placed by the stove, "lock the door" placed on the door at eye level, " take your medications" on  the bathroom mirror or by the place where you normally take your medications.  8.    Use alarms/timers.  Use while cooking to remind yourself to check on food or as a reminder to take your medicine, or as a  reminder to make a call, or as a reminder to perform another task, etc.    1. Look for the edge of objects (to the left and/or right) so that you make sure you are seeing all of an object 2. Turn your head when walking, scan from side to side, particularly in busy environments 3. Use an organized scanning pattern. It's usually easier to scan from top to bottom, and left to right (like you are reading) 4. Double check yourself 5. Use a line guide (like a blank piece of paper) or your finger when reading 6. If necessary, place brightly colored tape at end  of table or work area as a reminder to always look until you see the tape.

## 2016-09-08 NOTE — Therapy (Signed)
[facility] [address] [place], [place], [address] Phone: [phone]   Fax:  [phone]  Occupational Therapy Treatment  Patient Details  Name: Eric Pope MRN: [id number] Date of Birth: October 22, 1940 Referring Provider: Dr Claudette Laws  Encounter Date: 09/08/2016      OT End of Session - 09/08/16 1248    Visit Number 8   Number of Visits 19   Date for OT Re-Evaluation 09/30/16   Authorization Type BC/BS Medicare G Code    Authorization Time Period 08/02/16-09/30/16   Authorization - Visit Number 8   Authorization - Number of Visits 10   OT Start Time 1150  2 units, phone call to family member   OT Stop Time 1235   OT Time Calculation (min) 45 min   Activity Tolerance Patient tolerated treatment well   Behavior During Therapy [facility] for tasks assessed/performed      Past Medical History:  Diagnosis Date  . Chronic left shoulder pain   . Hypertension   . Kidney stones   . Lead poisoning   . Nephrolithiasis   . 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 CHOLECYSTECTOMY    . LITHOTRIPSY      Vitals:   09/08/16 1714  BP: (!) 150/95        Subjective Assessment - 09/08/16 1701    Subjective  Pt reports he sees Dr. Pearlean Brownie this week   Patient Stated Goals Pt goals = Improved cognition, visual perception.   Currently in Pain? No/denies             Lengthy discussion with pt's brother regarding pt's continued deficits, and safety concerns regarding pt cooking without supervision. Therapist  recommends a driving eval and  neuropsych testing before pt considers returning to work/ driving. Pt was instructed in flipping playing cards with big movements for bilateral UE's.                 OT Education - 09/08/16 1718    Education provided Yes   Education Details visual compensations, memory compensations, recommendation that pt  considers neuropsych testing and driving eval, infor regarding discussion with pt's brother, continued recommendation that pt has increased supervision for safety   Person(s) Educated Patient   Methods Explanation;Demonstration;Handout;Verbal cues   Comprehension Verbalized understanding;Returned demonstration;Need further instruction;Verbal cues required          OT Short Term Goals - 09/08/16 1209      OT SHORT TERM GOAL #1   Title Pt will be mod I basic simulated ADL (dressing) all aspects w/o vc's --check STGs 09/05/16   Time 4   Period Weeks   Status Achieved     OT SHORT TERM GOAL #2   Title Pt will be Mod I memory strategies and implement during OT sessions   Time 4   Period Weeks   Status Partially Met  Pt verbalizes understanding     OT SHORT TERM GOAL #3   Title Pt will be Mod I simple snack/meal prep adhering to safety and attending to left side with min v.c.   Time 4   Period Weeks   Status Partially Met     OT SHORT TERM GOAL #4   Title Pt will perform mod complex tabletop scanning activities with  95% or better accuracy.   Time 4   Period Weeks   Status On-going     OT SHORT TERM  GOAL #5   Title Pt will verbalize understanding of compensatory strategies for visual impairment.   Time 4   Period Weeks   Status On-going     OT SHORT TERM GOAL #6   Title i with initial HEP.   Time 4   Status On-going           OT Long Term Goals - 08/02/16 1630      OT LONG TERM GOAL #1   Title Pt will navigate a busy environment and perform environmental scanning with 905 or better accuracy.   Time 8   Period Weeks   Status Revised     OT LONG TERM GOAL #2   Title Pt will be Mod I higher level cognitive tasks related to finances (bill pay and balancing a check book).   Time 8   Period Weeks   Status New     OT LONG TERM GOAL #3   Title Pt will demonstrate improved coordination on bilaterally  as seen by improved 9 Hold Peg score by 10 seconds in prep for  functional use.   Baseline 07/25/16 = Left 49.03 seconds affected side (R = 108.84 seconds)    Time 8   Period Weeks   Status Revised     OT LONG TERM GOAL #4   Title Pt will be Mod I simple meal prep (fry and egg, make coffee) on stovetop while maintaining safety    Time 8   Period Weeks   Status On-going     OT LONG TERM GOAL #5   Title Pt will demonstrate ability to retrieve a lightweight object at 85* shoulder flexion with -10 elbow extension with his RUE.   Time 8   Period Weeks   Status New     Long Term Additional Goals   Additional Long Term Goals Yes     OT LONG TERM GOAL #6   Title Pt will demonstrate ability to perform a physical and cognitive task simultaneously with 85% or better accuracy.   Time 8   Period Weeks   Status New               Plan - 09/08/16 1715    Clinical Impression Statement Pt is progressing towards goals. He remains limited by decreased awareness and visual perceptual skills.   Rehab Potential Fair   Clinical Impairments Affecting Rehab Potential Lives alone; Brother to assist for several  weeks only, 24 hr supervision recommended for safety, bother is looking into options, Decreased cognition and decreased awareness of deficits   OT Frequency 2x / week   OT Duration 8 weeks   OT Treatment/Interventions Self-care/ADL training;Therapeutic exercise;Patient/family education;Neuromuscular education;Balance training;Therapeutic exercises;DME and/or AE instruction;Therapeutic activities;Cognitive remediation/compensation;Visual/perceptual remediation/compensation   Plan issue coordination HEP.   OT Home Exercise Plan PWR! moves in supine, shoulder flex with ball in sitting 08/28/16   Consulted and Agree with Plan of Care Patient      Patient will benefit from skilled therapeutic intervention in order to improve the following deficits and impairments:  Decreased endurance, Impaired vision/preception, Decreased knowledge of precautions, Impaired  perceived functional ability, Decreased activity tolerance, Decreased knowledge of use of DME, Decreased strength, Impaired sensation, Decreased balance, Decreased cognition, Decreased range of motion, Decreased coordination, Decreased safety awareness, Impaired UE functional use  Visit Diagnosis: Visuospatial deficit  Other lack of coordination  Muscle weakness (generalized)  Other abnormalities of gait and mobility  Hemiplegia and hemiparesis following cerebral infarction affecting left non-dominant side (HCC)  Problem List Patient Active Problem List   Diagnosis Date Noted  . Gait disturbance, post-stroke 08/04/2016  . Hypertensive crisis   . PAF (paroxysmal atrial fibrillation) ([place])   . 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 Paroxysmal atrial fibrillation (HCC)   . 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 Acute ischemic stroke ([place])   . CVA (cerebral vascular accident) ([place]) 07/10/2016  . Nonallopathic lesion of sacroiliac region 01/09/2013  . Greater trochanteric bursitis of left hip 07/09/2012  . Nonallopathic lesion of lumbosacral region 05/07/2012  . 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 Left hip pain 01/10/2012  . Rotator cuff syndrome of right shoulder 11/09/2011  . Bicipital tendinitis 11/09/2011  . Parkinson's disease ([place]) 11/09/2011  . Hypertension 11/09/2011    RINE,KATHRYN 09/08/2016, 5:20 PM  [facility] [address] [place], [place], [address] Phone: [phone]   Fax:  [phone]  Name: IDEN STRIPLING MRN: [id number] Date of Birth: 1940-08-17

## 2016-09-08 NOTE — Therapy (Signed)
[facility] [address] [place], [place], [address] Phone: [phone]   Fax:  [phone]  Speech Language Pathology Treatment  Patient Details  Name: Eric Pope MRN: [id number] Date of Birth: January 30, 1941 Referring Provider: Dr. Alysia Penna  Encounter Date: 09/08/2016      End of Session - 09/08/16 1224    Visit Number 8   Number of Visits 17   Date for SLP Re-Evaluation 09/22/16   SLP Start Time 1105   SLP Stop Time  1145   SLP Time Calculation (min) 40 min   Activity Tolerance Patient tolerated treatment well      Past Medical History:  Diagnosis Date  . Chronic left shoulder pain   . Hypertension   . Kidney stones   . Lead poisoning   . Nephrolithiasis   . 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 CHOLECYSTECTOMY    . LITHOTRIPSY      There were no vitals filed for this visit.      Subjective Assessment - 09/08/16 1222    Subjective Pt reports seeing Dr. Verdene Rio, who has increased dopamine.   Currently in Pain? No/denies               ADULT SLP TREATMENT - 09/08/16 0001      General Information   Behavior/Cognition Alert;Cooperative;Pleasant mood     Treatment Provided   Treatment provided Cognitive-Linquistic     Cognitive-Linquistic Treatment   Treatment focused on Cognition;Patient/family/caregiver education   Skilled Treatment Skilled ST session targeted goals toward deficit awareness and multiprocess reasoning tasks. Pt verbalized deficits including poor decision making, decreased focus, and neglect. Per visit with Dr. Angelica Ran, dopamine has been increased to decrease tremor and facilitate improvement of these cognitive linguistic difficulties. Pt completed deductive reasoning puzzle #1 with mod assist to fill in matrix accurately. #2 was given for homework.      Assessment / Recommendations / Plan   Plan Continue with  current plan of care     Progression Toward Goals   Progression toward goals Progressing toward goals          SLP Education - 09/08/16 1223    Education provided Yes   Education Details deductive reasoning puzzle #2, cognitive activities/apps   Person(s) Educated Patient   Methods Explanation;Demonstration;Handout;Verbal cues   Comprehension Verbalized understanding;Returned demonstration;Need further instruction;Verbal cues required          SLP Short Term Goals - 09/08/16 1231      SLP SHORT TERM GOAL #1   Title Pt will verbalize 3 cognitive impairment with occasional min A over 3 sessions.    Status Not Met - moved to LTG     SLP SHORT TERM GOAL #2   Title Pt will solve simple functional reasoning, organization and attention to detail problems with 85% accuracy and occasional min A   Status Not Met - moved to LTG     SLP SHORT TERM GOAL #3   Title Pt will demo selective attention for mod complex task for 5 minutes in quiet environment with rare min A back to task   Status Not Met  moved to long term goals          SLP Long Term Goals - 09/08/16 1231      SLP LONG TERM GOAL #1   Title Pt will ID errors on cognitive linguistic tasks with occasional min A  Time 4   Period Weeks   Status On-going     SLP LONG TERM GOAL #2   Title Pt will solve simple math, reasoning and organization tasks with 85% accuracy and occasional min A   Time 4   Period Weeks   Status On-going     SLP LONG TERM GOAL #3   Title Pt will attend to 8 minute mod complex tasks with occasional min A, resulting in at least 90% success   Time 4   Period Weeks   Status On-going     SLP LONG TERM GOAL #4   Title Pt will verbalize 3 cognitive impairment with occasional min A over 3 sessions.    Baseline 09/08/16   Time 4   Period Weeks   Status Revised     SLP LONG TERM GOAL #5   Title Pt will solve simple functional reasoning, organization and attention to detail problems with 85%  accuracy and occasional min A   Time 4   Period Weeks   Status Revised - from STG     Additional Long Term Goals   Additional Long Term Goals Yes     SLP LONG TERM GOAL #6   Title Pt will demo selective attention for mod complex task for 5 minutes in quiet environment with rare min A back to task   Time 4   Period Weeks   Status Revised - from STG     SLP LONG TERM GOAL #7   Title Pt will demo selective attention for mod complex task for 5 minutes in quiet environment with rare min A back to task   Time 4   Period Weeks   Status Revised  moved from short term goals          Plan - 09/08/16 1224    Clinical Impression Statement Pt reported his MD increased dopamine, which (per pt report) will help with his poor decision making, "unconscious neglect", and decreased focus. 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 Continued ST intervention is recommended to maximize cognitive linguistic skills for safety and independence   Speech Therapy Frequency 2x / week   Duration --  8 weeks   Treatment/Interventions Compensatory strategies;Functional tasks;Cognitive reorganization;Internal/external aids;SLP instruction and feedback;Patient/family education   Potential to Achieve Goals Fair   Potential Considerations Ability to learn/carryover information;Family/community support;Previous level of function;Cooperation/participation level;Severity of impairments   SLP Home Exercise Plan reviewed   Consulted and Agree with Plan of Care Patient      Patient will benefit from skilled therapeutic intervention in order to improve the following deficits and impairments:   Cognitive communication deficit    Problem List Patient Active Problem List   Diagnosis Date Noted  . Gait disturbance, post-stroke 08/04/2016  . Hypertensive crisis   . PAF (paroxysmal atrial fibrillation) (HCC)   . Right middle  cerebral artery stroke (HCC) 07/14/2016  . 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 Paroxysmal atrial fibrillation (HCC)   . 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 Acute ischemic stroke (HCC)   . CVA (cerebral vascular accident) (HCC) 07/10/2016  . Nonallopathic lesion of sacroiliac region 01/09/2013  . Greater trochanteric bursitis of left hip 07/09/2012  . Nonallopathic lesion of lumbosacral region 05/07/2012  . 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 Left hip pain 01/10/2012  . Rotator cuff syndrome of right shoulder 11/09/2011  . Bicipital tendinitis 11/09/2011  .  Parkinson's disease ([place]) 11/09/2011  . Hypertension 11/09/2011   Lovenia Debruler B. [place], MSP, CCC-SLP  Shonna Chock 09/08/2016, 12:39 PM  [facility] [address] [place], [place], [address] Phone: [phone]   Fax:  [phone]   Name: HOWIE RUFUS MRN: [id number] Date of Birth: Jul 09, 1940

## 2016-09-08 NOTE — Therapy (Signed)
[facility] [address] [place], [place], [address] Phone: [phone]   Fax:  [phone]  Physical Therapy Treatment  Patient Details  Name: Eric Pope MRN: [id number] Date of Birth: 05/18/1941 Referring Provider: Dr. Alysia Penna  Encounter Date: 09/08/2016      PT End of Session - 09/08/16 1937    Visit Number 8  G3   Number of Visits 14   Date for PT Re-Evaluation 09/22/16   Authorization Time Period 08/25/16 to 10/24/16   PT Start Time 0932   PT Stop Time 1018   PT Time Calculation (min) 46 min   Activity Tolerance Patient tolerated treatment well   Behavior During Therapy Impulsive      Past Medical History:  Diagnosis Date  . Chronic left shoulder pain   . Hypertension   . Kidney stones   . Lead poisoning   . Nephrolithiasis   . 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 CHOLECYSTECTOMY    . LITHOTRIPSY      Vitals:   09/08/16 0939  BP: (!) 142/100        Subjective Assessment - 09/08/16 0936    Subjective Reports he's walking around complex x 30 minutes. Has a gentleman from church working with him 10 hrs/wk (mostly spiritual talk).    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 No/denies            [facility] PT Assessment - 09/08/16 0001      6 Minute Walk- Baseline   6 Minute Walk- Baseline yes   BP (mmHg) (!)  142/100   HR (bpm) 61   02 Sat (%RA) 96 %   Modified Borg Scale for Dyspnea 0- Nothing at all   Perceived Rate of Exertion (Borg) 7- Very, very light     6 Minute walk- Post Test   BP (mmHg) (!)  182/110   HR (bpm) 72   02 Sat (%RA) 99 %     6 minute walk test results    Aerobic Endurance Distance Walked 1850     Functional Gait  Assessment   Gait Level  Surface Walks 20 ft, slow speed, abnormal gait pattern, evidence for imbalance or deviates 10-15 in outside of the 12 in walkway width. Requires more than 7 sec to ambulate 20 ft.   Change in Gait Speed Able to smoothly change walking speed without loss of balance or gait deviation. Deviate no more than 6 in outside of the 12 in walkway width.   Gait with Horizontal Head Turns Performs head turns smoothly with no change in gait. Deviates no more than 6 in outside 12 in walkway width   Gait with Vertical Head Turns Performs head turns with no change in gait. Deviates no more than 6 in outside 12 in walkway width.   Gait and Pivot Turn Pivot turns safely in greater than 3 sec and stops with no loss of balance, or pivot turns safely within 3 sec and stops with mild imbalance, requires small steps to catch balance.   Step Over Obstacle Is able to step over 2 stacked shoe boxes taped together (9 in total height) without changing gait speed. No evidence of imbalance.   Gait with Narrow Base of Support Ambulates  less than 4 steps heel to toe or cannot perform without assistance.   Gait with Eyes Closed Walks 20 ft, slow speed, abnormal gait pattern, evidence for imbalance, deviates 10-15 in outside 12 in walkway width. Requires more than 9 sec to ambulate 20 ft.   Ambulating Backwards Walks 20 ft, uses assistive device, slower speed, mild gait deviations, deviates 6-10 in outside 12 in walkway width.   Steps Alternating feet, no rail.   Total Score 21                     [facility] Adult PT Treatment/Exercise - 09/08/16 0001      Ambulation/Gait   Ambulation/Gait Yes   Ambulation/Gait Assistance 7: Independent   Ambulation Distance (Feet) 2200 Feet   Assistive device None   Gait Pattern Step-through pattern;Decreased arm swing - right;Decreased step length - right;Decreased step length - left;Right flexed knee in stance;Left flexed knee in stance;Decreased trunk rotation;Decreased dorsiflexion -  right;Poor foot clearance - left;Poor foot clearance - right   Ambulation Surface Level;Unlevel;Indoor;Outdoor;Paved;Gravel;Grass   Gait velocity 4.13 ft/sec (normal); 6.82 ft/sec fast                  PT Short Term Goals - 07/25/16 2228      PT SHORT TERM GOAL #1   Title see LTGs           PT Long Term Goals - 09/08/16 0944      PT LONG TERM GOAL #1   Title Demonstrate independence in HEP. (TARGET 09/08/16) New target for all LTG's 09/22/16   Time 4   Period Weeks   Status On-going     PT LONG TERM GOAL #2   Title Demonstrate decreased fall risk and improved gait/balance with increase FGA score to >=22/30;    4/20 21/30   Time 4   Period Weeks   Status On-going     PT LONG TERM GOAL #3   Title Patient able to walk independently in moderately distracting environment while attending to Left visual field to avoid running into/tripping over obstacles.   Time 4   Period Weeks   Status On-going     PT LONG TERM GOAL #4   Title Patient to demonstrate 10% improvement in distance in 6 MWT with vital signs stable. 09/08/16 1850 ft (>10% incr) however vitals not stable   Time 4   Period Weeks   Status On-going               Plan - 09/08/16 1945    Clinical Impression Statement Patient continues to have elevated BP and increased significantly with exercise (see 6MWT pre and post-test vitals). Pt is asymptomatic and does not exhibit concern re: risks of continued high BP and risk of CVA. Session focused on ambulation and standardized testing to demonstrate to patient he remains at risk for falls. Patient demonstrates above average gait velocity however demonstrates decreased balance in functional tasks (FGA=21/30).    Rehab Potential Good   Clinical Impairments Affecting Rehab Potential decr cognition and awareness of deficits   PT Frequency 2x / week   PT Duration 4 weeks   PT Treatment/Interventions ADLs/Self Care Home Management;Gait training;Stair  training;Functional mobility training;Therapeutic activities;Therapeutic exercise;Balance training;Neuromuscular re-education;Cognitive remediation;Patient/family education;Visual/perceptual remediation/compensation   PT Next Visit Plan dual task with walking in minimally distracting environment; review remainder of HEP issued 08/07/16 (only had time to review PWR! standing on 4/6)   Consulted and Agree with Plan of Care Patient  Patient will benefit from skilled therapeutic intervention in order to improve the following deficits and impairments:  Abnormal gait, Decreased balance, Decreased cognition, Decreased safety awareness, Decreased strength, Impaired vision/preception, Postural dysfunction  Visit Diagnosis: Other abnormalities of gait and mobility  Visuospatial deficit     Problem List Patient Active Problem List   Diagnosis Date Noted  . Gait disturbance, post-stroke 08/04/2016  . Hypertensive crisis   . PAF (paroxysmal atrial fibrillation) ([place])   . 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 Paroxysmal atrial fibrillation (HCC)   . 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 Acute ischemic stroke ([place])   . CVA (cerebral vascular accident) ([place]) 07/10/2016  . Nonallopathic lesion of sacroiliac region 01/09/2013  . Greater trochanteric bursitis of left hip 07/09/2012  . Nonallopathic lesion of lumbosacral region 05/07/2012  . 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 Left hip pain 01/10/2012  . Rotator cuff syndrome of right shoulder 11/09/2011  . Bicipital tendinitis 11/09/2011  . Parkinson's disease ([place]) 11/09/2011  . Hypertension 11/09/2011    Rexanne Mano, PT 09/08/2016, 7:57 PM  [facility] [address] [place], [place], [address] Phone: [phone]   Fax:  [phone]  Name: Eric Pope MRN: [id number] Date of Birth:  11-15-1940

## 2016-09-11 ENCOUNTER — Telehealth: Payer: Self-pay | Admitting: Internal Medicine

## 2016-09-11 NOTE — Telephone Encounter (Signed)
New Message  Pt call requesting to speak with RN about picking up lab orders from a 24 hr urine collection. Please call back to discuss

## 2016-09-11 NOTE — Telephone Encounter (Signed)
I called and spoke with the patient. He states he will be here later this week to see physical medicine on the 1st floor, he would like to pick up his jug for 24 hour urine at that time. I advised him I spoken with our lab tech and that he can either come up here and pick up the jug or he can go directly to [facility] on the 1st floor to pick this up.  He voices understanding.

## 2016-09-12 ENCOUNTER — Encounter: Payer: Medicare Other | Admitting: Occupational Therapy

## 2016-09-12 ENCOUNTER — Ambulatory Visit: Payer: Medicare Other | Admitting: Physical Therapy

## 2016-09-12 ENCOUNTER — Ambulatory Visit: Payer: Medicare Other | Admitting: *Deleted

## 2016-09-12 ENCOUNTER — Encounter: Payer: Medicare Other | Admitting: Speech Pathology

## 2016-09-12 ENCOUNTER — Ambulatory Visit: Payer: Medicare Other | Admitting: Occupational Therapy

## 2016-09-12 ENCOUNTER — Encounter: Payer: Self-pay | Admitting: Physical Therapy

## 2016-09-12 VITALS — BP 178/90

## 2016-09-12 VITALS — BP 180/105

## 2016-09-12 DIAGNOSIS — R41842 Visuospatial deficit: Secondary | ICD-10-CM

## 2016-09-12 DIAGNOSIS — R2689 Other abnormalities of gait and mobility: Secondary | ICD-10-CM

## 2016-09-12 DIAGNOSIS — M6281 Muscle weakness (generalized): Secondary | ICD-10-CM | POA: Diagnosis not present

## 2016-09-12 DIAGNOSIS — R278 Other lack of coordination: Secondary | ICD-10-CM

## 2016-09-12 DIAGNOSIS — R41841 Cognitive communication deficit: Secondary | ICD-10-CM

## 2016-09-12 NOTE — Therapy (Signed)
[facility] [address] [place], [place], [address] Phone: [phone]   Fax:  [phone]  Physical Therapy Treatment  Patient Details  Name: Eric Pope MRN: [id number] Date of Birth: 06/21/1940 Referring Provider: Dr. Alysia Penna  Encounter Date: 09/12/2016      PT End of Session - 09/12/16 1716    Visit Number 9  G4   Number of Visits 14   Date for PT Re-Evaluation 09/22/16   Authorization Time Period 08/25/16 to 10/24/16   PT Start Time 1335  session delayed due to pt medical reasons   PT Stop Time 1358   PT Time Calculation (min) 23 min   Activity Tolerance Treatment limited secondary to medical complications (Comment)  pt with likely episode of hypoglycemia just prior to PT Session   Behavior During Therapy Impulsive      Past Medical History:  Diagnosis Date  . Chronic left shoulder pain   . Hypertension   . Kidney stones   . Lead poisoning   . Nephrolithiasis   . 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 CHOLECYSTECTOMY    . LITHOTRIPSY      Vitals:   09/12/16 1330  BP: (!) 178/90        Subjective Assessment - 09/12/16 1319    Subjective Reports he feels hypoglycemic, "I didn't eat enough before I came." Upon further asking, pt reported he didn't eat breakfast because he did not have any food. Reports his brother arrives today and he is going to have someone cook for him.    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 No/denies                         [facility] Adult PT Treatment/Exercise - 09/12/16 0001      Transfers   Transfers Sit to Stand;Stand to Sit   Sit to Stand 6: Modified independent (Device/Increase time);Without upper extremity assist;From  chair/3-in-1     Ambulation/Gait   Ambulation/Gait Assistance 7: Independent   Ambulation Distance (Feet) 180 Feet   Assistive device None   Gait Pattern Step-through pattern;Decreased arm swing - right;Decreased step length - right;Decreased step length - left;Right flexed knee in stance;Left flexed knee in stance;Decreased trunk rotation   Ambulation Surface Level;Indoor             Balance Exercises - 09/12/16 1409      Balance Exercises: Standing   Standing Eyes Opened Narrow base of support (BOS);Foam/compliant surface;Head turns;30 secs   Standing Eyes Closed Narrow base of support (BOS);Head turns;Foam/compliant surface;30 secs   Tandem Stance Eyes open;Eyes closed;Foam/compliant surface;10 secs;30 secs   SLS Eyes open;Solid surface;Foam/compliant surface;5 reps   Other Standing Exercises weightshifting and stepping pre-gait activities with single UE support at counter           PT Education - 09/12/16 1714    Education provided Yes   Education Details need to eat prior to therapy sessions (especially when he has multiple sessions over lunch)   Person(s) Educated Patient   Methods Explanation   Comprehension Verbalized understanding;Need further instruction          PT Short Term Goals - 07/25/16 2228      PT SHORT TERM  GOAL #1   Title see LTGs           PT Long Term Goals - 09/08/16 0944      PT LONG TERM GOAL #1   Title Demonstrate independence in HEP. (TARGET 09/08/16) New target for all LTG's 09/22/16   Time 4   Period Weeks   Status On-going     PT LONG TERM GOAL #2   Title Demonstrate decreased fall risk and improved gait/balance with increase FGA score to >=22/30;    4/20 21/30   Time 4   Period Weeks   Status On-going     PT LONG TERM GOAL #3   Title Patient able to walk independently in moderately distracting environment while attending to Left visual field to avoid running into/tripping over obstacles.   Time 4   Period Weeks   Status  On-going     PT LONG TERM GOAL #4   Title Patient to demonstrate 10% improvement in distance in 6 MWT with vital signs stable. 09/08/16 1850 ft (>10% incr) however vitals not stable   Time 4   Period Weeks   Status On-going               Plan - 09/12/16 1718    Clinical Impression Statement Session abbreviated due to pt with warm, red skin with diaphoresis and stating he was having a hypoglycemic "moment." Patient reported he did not eat prior to coming to therapies (had OT and SLP sessions prior to PT) because he did not have any food in his home. Patient had consumed 2 packs peanut butter crackers and was drinking ginger-ale on PT arrival to lobby. Allowed pt time to finish his drink and pt then assessed (see vitals) and reported felt fine to attempt PT session. Focused on balance activities with rest periods to minimize exertion. Patient reports his brother arrives today from [place]. [name], OT has been in communication with patient's brother re: concerns for patient's safety as he has only 10 hours of assistance per week.    Rehab Potential Good   Clinical Impairments Affecting Rehab Potential decr cognition and awareness of deficits   PT Frequency 2x / week   PT Duration 4 weeks   PT Treatment/Interventions ADLs/Self Care Home Management;Gait training;Stair training;Functional mobility training;Therapeutic activities;Therapeutic exercise;Balance training;Neuromuscular re-education;Cognitive remediation;Patient/family education;Visual/perceptual remediation/compensation   PT Next Visit Plan G-code not due until 15th visit (see 4/6 renewal); dual task with walking in minimally distracting environment; review HEP issued 08/07/16; focus on static and dynamic balance   Consulted and Agree with Plan of Care Patient      Patient will benefit from skilled therapeutic intervention in order to improve the following deficits and impairments:  Abnormal gait, Decreased balance, Decreased  cognition, Decreased safety awareness, Decreased strength, Impaired vision/preception, Postural dysfunction  Visit Diagnosis: Other abnormalities of gait and mobility     Problem List Patient Active Problem List   Diagnosis Date Noted  . Gait disturbance, post-stroke 08/04/2016  . Hypertensive crisis   . PAF (paroxysmal atrial fibrillation) ([place])   . 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 Paroxysmal atrial fibrillation (H[facility])   . 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 Acute ischemic stroke ([place])   . CVA (cerebral vascular accident) ([place]) 07/10/2016  . Nonallopathic lesion of sacroiliac region 01/09/2013  . Greater trochanteric bursitis of left hip 07/09/2012  . Nonallopathic lesion of lumbosacral region 05/07/2012  . Cervical disc herniation  04/17/2012  . Left hip pain 01/10/2012  . Rotator cuff syndrome of right shoulder 11/09/2011  . Bicipital tendinitis 11/09/2011  . Parkinson's disease ([place]) 11/09/2011  . Hypertension 11/09/2011    Rexanne Mano, PT 09/12/2016, 5:27 PM  [facility] [address] [place], [place], [address] Phone: [phone]   Fax:  [phone]  Name: Eric Pope MRN: [id number] Date of Birth: 1941/04/19

## 2016-09-12 NOTE — Therapy (Signed)
[facility] [address] [place], [place], [address] Phone: [phone]   Fax:  [phone]  Occupational Therapy Treatment  Patient Details  Name: Eric Pope MRN: [id number] Date of Birth: 13-Jul-1940 Referring Provider: Dr Claudette Laws  Encounter Date: 09/12/2016      OT End of Session - 09/12/16 1127    Visit Number 9   Number of Visits 19   Date for OT Re-Evaluation 09/30/16   Authorization Type BC/BS Medicare G Code    Authorization Time Period 08/02/16-09/30/16   Authorization - Visit Number 9   Authorization - Number of Visits 10   OT Start Time 1105   OT Stop Time 1145   OT Time Calculation (min) 40 min   Behavior During Therapy [facility] for tasks assessed/performed      Past Medical History:  Diagnosis Date  . Chronic left shoulder pain   . Hypertension   . Kidney stones   . Lead poisoning   . Nephrolithiasis   . 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 CHOLECYSTECTOMY    . LITHOTRIPSY      Vitals:   09/12/16 1130  BP: (!) 180/105        Subjective Assessment - 09/12/16 1133    Subjective  Pt reports he sees Dr. Pearlean Brownie this week   Patient Stated Goals Pt goals = Improved cognition, visual perception.   Currently in Pain? No/denies       Treatment: BP initally 180/105, therapist  worked at [employer] as a result. PWR! Hands for rock, twist and PWR! Up., mod v.c./ demonstration Copying small peg design with right and left UE's, mod/ max difficulty and v.c. For correct design, particularly attending to left. Pt did not complete design due to requiring increased time and v.c. for organization. BP after sitting for 25 mins, 180/100 Environmental scanning, pt missed 4/14 items on first pass. With second pass pt required v.c. To locate 2 /4 items he missed. Therapist reinforced that effective scanning skills would be necessary prior to  returning to driving. (In previous visit, therapist recommended pt considers neuropsych testing prior to return to work and pt will likely need a driving eval prior to return to driving.)                         OT Short Term Goals - 09/12/16 1123      OT SHORT TERM GOAL #1   Title Pt will be mod I basic simulated ADL (dressing) all aspects w/o vc's --check STGs 09/05/16   Time 4   Period Weeks   Status Achieved     OT SHORT TERM GOAL #2   Title Pt will be Mod I memory strategies and implement during OT sessions   Time 4   Period Weeks   Status Partially Met  Pt verbalizes understanding     OT SHORT TERM GOAL #3   Title Pt will be Mod I simple snack/meal prep adhering to safety and attending to left side with min v.c.   Time 4   Period Weeks   Status Partially Met     OT SHORT TERM GOAL #4   Title Pt will perform mod complex tabletop scanning activities with  95% or better accuracy.   Time 4   Period Weeks   Status On-going     OT SHORT TERM GOAL #5   Title  Pt will verbalize understanding of compensatory strategies for visual impairment.   Time 4   Period Weeks   Status On-going     OT SHORT TERM GOAL #6   Title i with initial HEP.   Time 4   Status On-going           OT Long Term Goals - 08/02/16 1630      OT LONG TERM GOAL #1   Title Pt will navigate a busy environment and perform environmental scanning with 905 or better accuracy.   Time 8   Period Weeks   Status Revised     OT LONG TERM GOAL #2   Title Pt will be Mod I higher level cognitive tasks related to finances (bill pay and balancing a check book).   Time 8   Period Weeks   Status New     OT LONG TERM GOAL #3   Title Pt will demonstrate improved coordination on bilaterally  as seen by improved 9 Hold Peg score by 10 seconds in prep for functional use.   Baseline 07/25/16 = Left 49.03 seconds affected side (R = 108.84 seconds)    Time 8   Period Weeks   Status Revised      OT LONG TERM GOAL #4   Title Pt will be Mod I simple meal prep (fry and egg, make coffee) on stovetop while maintaining safety    Time 8   Period Weeks   Status On-going     OT LONG TERM GOAL #5   Title Pt will demonstrate ability to retrieve a lightweight object at 85* shoulder flexion with -10 elbow extension with his RUE.   Time 8   Period Weeks   Status New     Long Term Additional Goals   Additional Long Term Goals Yes     OT LONG TERM GOAL #6   Title Pt will demonstrate ability to perform a physical and cognitive task simultaneously with 85% or better accuracy.   Time 8   Period Weeks   Status New               Plan - 09/12/16 1250    Clinical Impression Statement Pt is progressing slowly towards goals. He is limited by significant visual perceptual and cognitive deficits which impact his safety.. Therapist discussed with pt that he will need improved visual perceptual skills, prior to return to driving. Pt's blood pressure remains elevated, pt sees his MD this week and therapist encouraged pt to discuss with MD.   Rehab Potential Fair   Clinical Impairments Affecting Rehab Potential Lives alone; Brother to assist for several  weeks only, 24 hr supervision recommended for safety, bother is looking into options, Decreased cognition and decreased awareness of deficits   OT Frequency 2x / week   OT Duration 8 weeks   OT Treatment/Interventions Self-care/ADL training;Therapeutic exercise;Patient/family education;Neuromuscular education;Balance training;Therapeutic exercises;DME and/or AE instruction;Therapeutic activities;Cognitive remediation/compensation;Visual/perceptual remediation/compensation   Plan continue to address cognition, visual perceptual skills, coordination   OT Home Exercise Plan PWR! moves in supine, shoulder flex with ball in sitting 08/28/16   Consulted and Agree with Plan of Care Patient      Patient will benefit from skilled therapeutic intervention  in order to improve the following deficits and impairments:  Decreased endurance, Impaired vision/preception, Decreased knowledge of precautions, Impaired perceived functional ability, Decreased activity tolerance, Decreased knowledge of use of DME, Decreased strength, Impaired sensation, Decreased balance, Decreased cognition, Decreased range of motion, Decreased coordination, Decreased  safety awareness, Impaired UE functional use  Visit Diagnosis: Visuospatial deficit  Other lack of coordination  Muscle weakness (generalized)    Problem List Patient Active Problem List   Diagnosis Date Noted  . Gait disturbance, post-stroke 08/04/2016  . Hypertensive crisis   . PAF (paroxysmal atrial fibrillation) ([place])   . 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 Paroxysmal atrial fibrillation ([facility])   . 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 Acute ischemic stroke ([place])   . CVA (cerebral vascular accident) ([place]) 07/10/2016  . Nonallopathic lesion of sacroiliac region 01/09/2013  . Greater trochanteric bursitis of left hip 07/09/2012  . Nonallopathic lesion of lumbosacral region 05/07/2012  . 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 Left hip pain 01/10/2012  . Rotator cuff syndrome of right shoulder 11/09/2011  . Bicipital tendinitis 11/09/2011  . Parkinson's disease ([place]) 11/09/2011  . Hypertension 11/09/2011    Sahirah Rudell 09/12/2016, 12:55 PM [name], OTR/L [phone] Phone: [phone] 12:56 PM 09/12/16 [facility] [address] [place], [place], [address] Phone: [phone]   Fax:  [phone]  Name: Eric Pope MRN: [id number] Date of Birth: 04-10-1941
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
## 2016-09-13 ENCOUNTER — Telehealth: Payer: Self-pay | Admitting: Internal Medicine

## 2016-09-13 NOTE — Telephone Encounter (Signed)
New Message    Per pt his doctor thinks he may be in afib, and wants him to schedule a EKG. If at all possible would like a EKG for tomorrow. 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 Requesting call back from nurse asap

## 2016-09-13 NOTE — Telephone Encounter (Signed)
Called patient about his message. Patient thinks he is back in A. Fib. Patient stated that Dr. Caryl Comes wanted him to call if he ever went back into A. Fib. Patient state his heart rate is irregular and in the 110's. Patient is concerned since he is not taking any traditional blood thinners. Patient stated he is taking an alternative medication for his A. Fib. Informed patient that we have a A. Fib clinic that would be willing to see him tomorrow. Patient stated that would be great and he would be able to make it to the appointment. Patient will call if he has any other concerns.

## 2016-09-14 ENCOUNTER — Ambulatory Visit (HOSPITAL_COMMUNITY)
Admission: RE | Admit: 2016-09-14 | Discharge: 2016-09-14 | Disposition: A | Discharge status: Home / Self Care | Payer: Medicare Other | Source: Ambulatory Visit | Attending: Nurse Practitioner | Admitting: Nurse Practitioner

## 2016-09-14 ENCOUNTER — Encounter: Payer: Self-pay | Admitting: Neurology

## 2016-09-14 ENCOUNTER — Other Ambulatory Visit: Payer: Medicare Other

## 2016-09-14 ENCOUNTER — Encounter: Payer: Self-pay | Admitting: Physical Medicine & Rehabilitation

## 2016-09-14 ENCOUNTER — Ambulatory Visit (HOSPITAL_BASED_OUTPATIENT_CLINIC_OR_DEPARTMENT_OTHER): Payer: Medicare Other | Admitting: Physical Medicine & Rehabilitation

## 2016-09-14 ENCOUNTER — Encounter (HOSPITAL_COMMUNITY): Payer: Self-pay | Admitting: Nurse Practitioner

## 2016-09-14 ENCOUNTER — Ambulatory Visit (INDEPENDENT_AMBULATORY_CARE_PROVIDER_SITE_OTHER): Payer: Medicare Other | Admitting: Neurology

## 2016-09-14 ENCOUNTER — Encounter: Payer: Medicare Other | Admitting: Speech Pathology

## 2016-09-14 VITALS — BP 126/86 | HR 96 | Ht 68.0 in | Wt 181.0 lb

## 2016-09-14 VITALS — BP 123/87 | HR 79 | Resp 14

## 2016-09-14 VITALS — BP 113/81 | HR 81 | Ht 68.0 in | Wt 181.2 lb

## 2016-09-14 DIAGNOSIS — Z87442 Personal history of urinary calculi: Secondary | ICD-10-CM | POA: Diagnosis not present

## 2016-09-14 DIAGNOSIS — I69319 Unspecified symptoms and signs involving cognitive functions following cerebral infarction: Secondary | ICD-10-CM | POA: Diagnosis not present

## 2016-09-14 DIAGNOSIS — Z8673 Personal history of transient ischemic attack (TIA), and cerebral infarction without residual deficits: Secondary | ICD-10-CM | POA: Diagnosis not present

## 2016-09-14 DIAGNOSIS — Z8249 Family history of ischemic heart disease and other diseases of the circulatory system: Secondary | ICD-10-CM | POA: Diagnosis not present

## 2016-09-14 DIAGNOSIS — I10 Essential (primary) hypertension: Secondary | ICD-10-CM | POA: Diagnosis not present

## 2016-09-14 DIAGNOSIS — I63511 Cerebral infarction due to unspecified occlusion or stenosis of right middle cerebral artery: Secondary | ICD-10-CM | POA: Diagnosis not present

## 2016-09-14 DIAGNOSIS — Z79899 Other long term (current) drug therapy: Secondary | ICD-10-CM | POA: Diagnosis not present

## 2016-09-14 DIAGNOSIS — I480 Paroxysmal atrial fibrillation: Secondary | ICD-10-CM

## 2016-09-14 DIAGNOSIS — Z881 Allergy status to other antibiotic agents status: Secondary | ICD-10-CM | POA: Insufficient documentation

## 2016-09-14 DIAGNOSIS — I63411 Cerebral infarction due to embolism of right middle cerebral artery: Secondary | ICD-10-CM

## 2016-09-14 DIAGNOSIS — Z833 Family history of diabetes mellitus: Secondary | ICD-10-CM | POA: Diagnosis not present

## 2016-09-14 DIAGNOSIS — R414 Neurologic neglect syndrome: Secondary | ICD-10-CM

## 2016-09-14 DIAGNOSIS — Z91040 Latex allergy status: Secondary | ICD-10-CM | POA: Insufficient documentation

## 2016-09-14 DIAGNOSIS — I4891 Unspecified atrial fibrillation: Secondary | ICD-10-CM | POA: Insufficient documentation

## 2016-09-14 DIAGNOSIS — I4892 Unspecified atrial flutter: Secondary | ICD-10-CM | POA: Diagnosis not present

## 2016-09-14 DIAGNOSIS — Z5189 Encounter for other specified aftercare: Secondary | ICD-10-CM | POA: Diagnosis not present

## 2016-09-14 MED ORDER — APIXABAN 5 MG PO TABS: 5.0000 mg | ORAL_TABLET | Freq: Two times a day (BID) | ORAL | 0 refills | Status: DC

## 2016-09-14 MED ORDER — METOPROLOL SUCCINATE ER 25 MG PO TB24: 12.5000 mg | ORAL_TABLET | Freq: Every day | ORAL | 1 refills | Status: DC
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
## 2016-09-14 NOTE — Patient Instructions (Signed)
Your physician has recommended you make the following change in your medication:  1)Start Eliquis 5mg  twice a day 2)Start Metoprolol (toprol) 1/2 tablet (12.5mg ) once a day
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
## 2016-09-14 NOTE — Patient Instructions (Signed)
I had a long d/w patient about his recent embolic stroke,atrial fibrillation, parkinsonian tremors risk for recurrent stroke/TIAs, personally independently reviewed imaging studies and stroke evaluation results and answered questions.Continue Eliquis (apixaban) daily  for secondary stroke prevention and maintain strict control of hypertension with blood pressure goal below 130/90, diabetes with hemoglobin A1c goal below 6.5% and lipids with LDL cholesterol goal below 70 mg/dL. I also advised the patient to eat a healthy diet with plenty of whole grains, cereals, fruits and vegetables, exercise regularly and maintain ideal body weight he was advised to follow-up with his neurologist in [facility] for parkinsonian tremor Followup in the future with me in 3 months or call earlier if necessary Stroke Prevention Some medical conditions and behaviors are associated with an increased chance of having a stroke. You may prevent a stroke by making healthy choices and managing medical conditions. How can I reduce my risk of having a stroke?  Stay physically active. Get at least 30 minutes of activity on most or all days.  Do not smoke. It may also be helpful to avoid exposure to secondhand smoke.  Limit alcohol use. Moderate alcohol use is considered to be:  No more than 2 drinks per day for men.  No more than 1 drink per day for nonpregnant women.  Eat healthy foods. This involves:  Eating 5 or more servings of fruits and vegetables a day.  Making dietary changes that address high blood pressure (hypertension), high cholesterol, diabetes, or obesity.  Manage your cholesterol levels.  Making food choices that are high in fiber and low in saturated fat, trans fat, and cholesterol may control cholesterol levels.  Take any prescribed medicines to control cholesterol as directed by your health care provider.  Manage your diabetes.  Controlling your carbohydrate and sugar intake is recommended to manage  diabetes.  Take any prescribed medicines to control diabetes as directed by your health care provider.  Control your hypertension.  Making food choices that are low in salt (sodium), saturated fat, trans fat, and cholesterol is recommended to manage hypertension.  Ask your health care provider if you need treatment to lower your blood pressure. Take any prescribed medicines to control hypertension as directed by your health care provider.  If you are 73-65 years of age, have your blood pressure checked every 3-5 years. If you are 35 years of age or older, have your blood pressure checked every year.  Maintain a healthy weight.  Reducing calorie intake and making food choices that are low in sodium, saturated fat, trans fat, and cholesterol are recommended to manage weight.  Stop drug abuse.  Avoid taking birth control pills.  Talk to your health care provider about the risks of taking birth control pills if you are over 47 years old, smoke, get migraines, or have ever had a blood clot.  Get evaluated for sleep disorders (sleep apnea).  Talk to your health care provider about getting a sleep evaluation if you snore a lot or have excessive sleepiness.  Take medicines only as directed by your health care provider.  For some people, aspirin or blood thinners (anticoagulants) are helpful in reducing the risk of forming abnormal blood clots that can lead to stroke. If you have the irregular heart rhythm of atrial fibrillation, you should be on a blood thinner unless there is a good reason you cannot take them.  Understand all your medicine instructions.  Make sure that other conditions (such as anemia or atherosclerosis) are addressed. Get help  right away if:  You have sudden weakness or numbness of the face, arm, or leg, especially on one side of the body.  Your face or eyelid droops to one side.  You have sudden confusion.  You have trouble speaking (aphasia) or  understanding.  You have sudden trouble seeing in one or both eyes.  You have sudden trouble walking.  You have dizziness.  You have a loss of balance or coordination.  You have a sudden, severe headache with no known cause.  You have new chest pain or an irregular heartbeat. Any of these symptoms may represent a serious problem that is an emergency. Do not wait to see if the symptoms will go away. Get medical help at once. Call your local emergency services (911 in [place] Do not drive yourself to the hospital. This information is not intended to replace advice given to you by your health care provider. Make sure you discuss any questions you have with your health care provider. Document Released: 06/15/2004 Document Revised: 10/14/2015 Document Reviewed: 11/08/2012 Elsevier Interactive Patient Education  2017 [employer].

## 2016-09-14 NOTE — Patient Instructions (Signed)
No driving  No return to work
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
## 2016-09-15 ENCOUNTER — Ambulatory Visit: Payer: Medicare Other

## 2016-09-15 ENCOUNTER — Ambulatory Visit: Payer: Medicare Other | Admitting: Occupational Therapy

## 2016-09-15 ENCOUNTER — Ambulatory Visit: Payer: Medicare Other | Admitting: Physical Therapy

## 2016-09-15 ENCOUNTER — Encounter: Payer: Self-pay | Admitting: Physical Therapy

## 2016-09-15 VITALS — BP 158/88

## 2016-09-15 VITALS — BP 150/85

## 2016-09-15 DIAGNOSIS — M6281 Muscle weakness (generalized): Secondary | ICD-10-CM

## 2016-09-15 DIAGNOSIS — R41842 Visuospatial deficit: Secondary | ICD-10-CM

## 2016-09-15 DIAGNOSIS — R2689 Other abnormalities of gait and mobility: Secondary | ICD-10-CM

## 2016-09-15 DIAGNOSIS — R278 Other lack of coordination: Secondary | ICD-10-CM

## 2016-09-15 DIAGNOSIS — R41841 Cognitive communication deficit: Secondary | ICD-10-CM

## 2016-09-15 DIAGNOSIS — I69354 Hemiplegia and hemiparesis following cerebral infarction affecting left non-dominant side: Secondary | ICD-10-CM
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
## 2016-09-15 NOTE — Therapy (Signed)
[facility] [address] [place], [place], [address] Phone: [phone]   Fax:  [phone]  Occupational Therapy Treatment  Patient Details  Name: Eric Pope MRN: [id number] Date of Birth: 1940/08/04 Referring Provider: Dr Alysia Penna  Encounter Date: 09/15/2016      OT End of Session - 09/15/16 1403    Visit Number 10   Number of Visits 19   Date for OT Re-Evaluation 09/30/16   Authorization Type BC/BS Medicare G Code    Authorization Time Period 08/02/16-09/30/16   Authorization - Visit Number 10   Authorization - Number of Visits 10   OT Start Time 1020  2 units, pt eating for 1 unit   OT Stop Time 1100   OT Time Calculation (min) 40 min   Activity Tolerance Patient tolerated treatment well   Behavior During Therapy [name] for tasks assessed/performed      Past Medical History:  Diagnosis Date  . Chronic left shoulder pain   . Hypertension   . Kidney stones   . Lead poisoning   . Nephrolithiasis   . 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 Vision abnormalities     Past Surgical History:  Procedure Laterality Date  . CHOLECYSTECTOMY    . LITHOTRIPSY      Vitals:   09/15/16 1040  BP: (!) 150/85          Pt's brother was present today. Therapist discussed continued concerns with pt/ brother. Pt's brother reports pt's caregiver is going to assist with making sure pt has food, as pt did not eat prior to therapy earlier this week, reporting no food at home. Pt got off schedule this a.m and he did not eat prior to therapy today. Pt was provided with crackers and gingerale. Pt practiced flipping and dealing playing cards with big movements, mod v.c. And demonstration. Standing to fold several towels, with min v.c. And demonstration as this task has been difficult perceptually. Attempted PWR! Rock and twist in [place], discontinued due to shoulder pain.                         OT Short Term Goals - 09/12/16 1123      OT SHORT TERM GOAL #1   Title Pt will be mod I basic simulated ADL (dressing) all aspects w/o vc's --check STGs 09/05/16   Time 4   Period Weeks   Status Achieved     OT SHORT TERM GOAL #2   Title Pt will be Mod I memory strategies and implement during OT sessions   Time 4   Period Weeks   Status Partially Met  Pt verbalizes understanding     OT SHORT TERM GOAL #3   Title Pt will be Mod I simple snack/meal prep adhering to safety and attending to left side with min v.c.   Time 4   Period Weeks   Status Partially Met     OT SHORT TERM GOAL #4   Title Pt will perform mod complex tabletop scanning activities with  95% or better accuracy.   Time 4   Period Weeks   Status On-going     OT SHORT TERM GOAL #5   Title Pt will verbalize understanding of compensatory strategies for visual impairment.   Time 4   Period Weeks   Status On-going     OT SHORT TERM GOAL #6   Title i with initial HEP.  Time 4   Status On-going           OT Long Term Goals - 08/02/16 1630      OT LONG TERM GOAL #1   Title Pt will navigate a busy environment and perform environmental scanning with 905 or better accuracy.   Time 8   Period Weeks   Status Revised     OT LONG TERM GOAL #2   Title Pt will be Mod I higher level cognitive tasks related to finances (bill pay and balancing a check book).   Time 8   Period Weeks   Status New     OT LONG TERM GOAL #3   Title Pt will demonstrate improved coordination on bilaterally  as seen by improved 9 Hold Peg score by 10 seconds in prep for functional use.   Baseline 07/25/16 = Left 49.03 seconds affected side (R = 108.84 seconds)    Time 8   Period Weeks   Status Revised     OT LONG TERM GOAL #4   Title Pt will be Mod I simple meal prep (fry and egg, make coffee) on stovetop while maintaining safety    Time 8   Period Weeks   Status On-going     OT LONG TERM GOAL #5   Title Pt will demonstrate  ability to retrieve a lightweight object at 85* shoulder flexion with -10 elbow extension with his RUE.   Time 8   Period Weeks   Status New     Long Term Additional Goals   Additional Long Term Goals Yes     OT LONG TERM GOAL #6   Title Pt will demonstrate ability to perform a physical and cognitive task simultaneously with 85% or better accuracy.   Time 8   Period Weeks   Status New               Plan - Sep 28, 2016 1401    Clinical Impression Statement Pt's brother was with him today. Therapsit discussed progress with pt and brothe and concerns over BP issues, pt not eating prior to therapy and visual perceptual deficits.   Rehab Potential Fair   Clinical Impairments Affecting Rehab Potential has hired caregiver 10 hrs per week   OT Duration 8 weeks   OT Treatment/Interventions Self-care/ADL training;Therapeutic exercise;Patient/family education;Neuromuscular education;Balance training;Therapeutic exercises;DME and/or AE instruction;Therapeutic activities;Cognitive remediation/compensation;Visual/perceptual remediation/compensation   Plan cognition, visual perceptual skills, review ball ex   Consulted and Agree with Plan of Care Patient;Family member/caregiver   Family Member Consulted Brother - Eric Pope      Patient will benefit from skilled therapeutic intervention in order to improve the following deficits and impairments:  Decreased endurance, Impaired vision/preception, Decreased knowledge of precautions, Impaired perceived functional ability, Decreased activity tolerance, Decreased knowledge of use of DME, Decreased strength, Impaired sensation, Decreased balance, Decreased cognition, Decreased range of motion, Decreased coordination, Decreased safety awareness, Impaired UE functional use  Visit Diagnosis: Muscle weakness (generalized)  Other abnormalities of gait and mobility  Other lack of coordination  Visuospatial deficit  Hemiplegia and hemiparesis following  cerebral infarction affecting left non-dominant side (HCC)      G-Codes - 2016/09/28 1408    Functional Assessment Tool Used (Outpatient only) Clinical judgement; min v.c folding towels, 4/14 missed for environmental scanning   Functional Limitation Self care   Carrying, Moving and Handling Objects Goal Status (U5427) --   Self Care Current Status (C6237) At least 40 percent but less than 60 percent impaired, limited or restricted  Self Care Goal Status [phone]) At least 1 percent but less than 20 percent impaired, limited or restricted    Occupational Therapy Progress Note  Dates of Reporting Period: 07/25/16 to 09/15/16  Objective Reports of Subjective Statement: 4/14 items missed with environmental scanning  Objective Measurements: see above  Goal Update: Pt is progressing slowly limited by BP issues and visual perceptual deficits.   Plan: Continue to work towards unmet goals  Reason Skilled Services are Required: Pt can benefit from skilled occupational therapy to address bradykinesia, decreased coordination, decreased balance, decreased ROM, visual perceptual deficits, cognitive deficits, tremor and rigidity in order to maximize safety and indepdenence with ADLs/ IADLs.  Problem List Patient Active Problem List   Diagnosis Date Noted  . 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 Gait disturbance, post-stroke 08/04/2016  . Hypertensive crisis   . PAF (paroxysmal atrial fibrillation) ([place])   . 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 Paroxysmal atrial fibrillation (HCC)   . 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 Acute ischemic stroke ([place])   . CVA (cerebral vascular accident) ([place]) 07/10/2016  . Nonallopathic lesion of sacroiliac region 01/09/2013  . Greater trochanteric bursitis of left hip 07/09/2012  . Nonallopathic lesion of lumbosacral region 05/07/2012  . 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  Left hip pain 01/10/2012  . Rotator cuff syndrome of right shoulder 11/09/2011  . Bicipital tendinitis 11/09/2011  . Parkinson's disease ([place]) 11/09/2011  . Hypertension 11/09/2011    RINE,KATHRYN 09/15/2016, 2:13 PM [name], OTR/L [phone] Phone: [phone] 2:13 PM 09/15/16 [facility] [address] [place], [place], [address] Phone: [phone]   Fax:  [phone]  Name: Eric Pope MRN: [id number] Date of Birth: 1940-06-13

## 2016-09-15 NOTE — Therapy (Signed)
[facility] [address] [place], [place], [address] Phone: [phone]   Fax:  [phone]  Speech Language Pathology Treatment  Patient Details  Name: Eric Pope MRN: [id number] Date of Birth: 11-22-1940 Referring Provider: Dr. Alysia Penna  Encounter Date: 09/15/2016      End of Session - 09/15/16 1342    Visit Number 10   Number of Visits 17   Date for SLP Re-Evaluation 09/22/16   SLP Start Time 0940  10 minutes late   SLP Stop Time  1017   SLP Time Calculation (min) 37 min   Activity Tolerance Patient tolerated treatment well      Past Medical History:  Diagnosis Date  . Chronic left shoulder pain   . Hypertension   . Kidney stones   . Lead poisoning   . Nephrolithiasis   . 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 Vision abnormalities     Past Surgical History:  Procedure Laterality Date  . CHOLECYSTECTOMY    . LITHOTRIPSY      There were no vitals filed for this visit.      Subjective Assessment - 09/15/16 1324    Subjective Pt's brother arrived with pt today, staying until Tuesday. "Well Glendell Docker, I'm in atrial fib."   Patient is accompained by: Family member  [name], brother               ADULT SLP TREATMENT - 09/15/16 1325      General Information   Behavior/Cognition Alert;Cooperative;Pleasant mood;Impulsive;Distractible;Decreased sustained attention     Treatment Provided   Treatment provided Cognitive-Linquistic     Cognitive-Linquistic Treatment   Treatment focused on Cognition;Patient/family/caregiver education   Skilled Treatment Much of session was spent explaining to pt/brother how observed behaviors in clinic (hypoglycemia last session, no meds recnetly due to needing refills) are direct result of cognitive-communication deficts of reduced functional problem solving, reduced executive function, reduced sustained and selective attention, etc. SLP reiterated pt's need  to have someone with him >10 hours a week for pt safety reasons. SLP engaged pt in task requiring him to name 3 simiarities and 3 differences and pt req'd consistent min-mod cues to tell similarities, and then differences, instead of switching back and forth between them. SLP highlighted to brother/pt that is an example of reduced attention and awareness manifesting functionally in pt's everyday responses.      Assessment / Recommendations / Plan   Plan Continue with current plan of care     Progression Toward Goals   Progression toward goals Progressing toward goals            SLP Short Term Goals - 09/12/16 1241      SLP SHORT TERM GOAL #1   Title Pt will verbalize 3 cognitive impairment with occasional min A over 3 sessions.    Status Not Met     SLP SHORT TERM GOAL #2   Title Pt will solve simple functional reasoning, organization and attention to detail problems with 85% accuracy and occasional min A   Status Not Met     SLP SHORT TERM GOAL #3   Title Pt will demo selective attention for mod complex task for 5 minutes in quiet environment with rare min A back to task   Status Not Met          SLP Long Term Goals - 09/15/16 1346      SLP LONG TERM GOAL #1   Title Pt will ID  errors on cognitive linguistic tasks with occasional min A   Time 3   Period Weeks   Status On-going     SLP LONG TERM GOAL #2   Title Pt will solve simple math, reasoning and organization tasks with 80% accuracy and occasional min A   Time 3   Period Weeks   Status Revised     SLP LONG TERM GOAL #3   Title Pt will attend to 8 minute simple tasks with occasional min A, resulting in at least 90% success   Time 3   Period Weeks   Status Revised     SLP LONG TERM GOAL #4   Title Pt will verbalize 3 cognitive impairment with occasional min A over 3 sessions.    Baseline 09/08/16   Time 3   Period Weeks   Status On-going     SLP LONG TERM GOAL #5   Title Pt will solve simple functional  reasoning, organization and attention to detail problems with 85% accuracy and occasional min A   Time 3   Period Weeks   Status On-going     SLP LONG TERM GOAL #6   Title Pt will demo selective attention for mod complex task for 5 minutes in quiet environment with rare min A back to task   Time 3   Period Weeks   Status On-going     SLP LONG TERM GOAL #7   Title Pt will demo selective attention for mod complex task for 5 minutes in quiet environment with rare min A back to task   Time 3   Period Weeks   Status On-going          Plan - 10/11/16 1344    Clinical Impression Statement Pt persisting in requiring verbal cues to maintain attention to task and thus for comprehension of simple instructions due to decr'd attention. Explained to pt/brother how cognitive-communication deficits play out in pt's life outside therapies. SLP believes pt should have more supervision that what he has currently, best case scenario would be 24/7 supervision. Continued ST intervention is recommended, targeting cognitive linguistic skills for safety and independence, as well as reduced caregiver burden.   Speech Therapy Frequency 2x / week   Duration --  8 weeks   Treatment/Interventions Compensatory strategies;Functional tasks;Cognitive reorganization;Internal/external aids;SLP instruction and feedback;Patient/family education   Potential to Achieve Goals Fair   Potential Considerations Ability to learn/carryover information;Family/community support;Previous level of function;Cooperation/participation level;Severity of impairments      Patient will benefit from skilled therapeutic intervention in order to improve the following deficits and impairments:   Cognitive communication deficit      G-Codes - 10-11-16 1343    Functional Limitations Attention   Attention Current Status (I1443) At least 40 percent but less than 60 percent impaired, limited or restricted   Attention Goal Status (X5400) At  least 40 percent but less than 60 percent impaired, limited or restricted      Speech Therapy Progress Note  Dates of Reporting Period: 07-25-16 to present  Subjective Statement: Pt has been seen for 10 visits of skilled ST focusing on cognitive-communication goals.  Objective Measurements: Pt cont to require SLP A for simple linguistic tasks, consistently.   Goal Update: See goals above. Goals have had to be downgraded at least once due to lack of progress.  Plan: cont to see pt at least for two more weeks.   Reason Skilled Services are Required: Pt has had some medical issues since initiation of therapy.  Therapy will cont for approx 2 more weeks, at least. If warranted therapeutically, a renewal will be written to cont in skilled ST.       Problem List Patient Active Problem List   Diagnosis Date Noted  . 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 Gait disturbance, post-stroke 08/04/2016  . Hypertensive crisis   . PAF (paroxysmal atrial fibrillation) ([place])   . 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 Paroxysmal atrial fibrillation (HCC)   . 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 Acute ischemic stroke ([place])   . CVA (cerebral vascular accident) ([place]) 07/10/2016  . Nonallopathic lesion of sacroiliac region 01/09/2013  . Greater trochanteric bursitis of left hip 07/09/2012  . Nonallopathic lesion of lumbosacral region 05/07/2012  . 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 Left hip pain 01/10/2012  . Rotator cuff syndrome of right shoulder 11/09/2011  . Bicipital tendinitis 11/09/2011  . Parkinson's disease ([place]) 11/09/2011  . Hypertension 11/09/2011    [facility] ,[place], [place]  09/15/2016, 1:49 PM  [facility] [address] [place], [place], [address] Phone: [phone]   Fax:  [phone]   Name: Eric Pope MRN:  [id number] Date of Birth: Dec 27, 1940

## 2016-09-18 ENCOUNTER — Encounter (HOSPITAL_BASED_OUTPATIENT_CLINIC_OR_DEPARTMENT_OTHER): Payer: Medicare Other | Admitting: Psychology

## 2016-09-18 ENCOUNTER — Telehealth: Payer: Self-pay | Admitting: Internal Medicine

## 2016-09-18 ENCOUNTER — Encounter: Payer: Self-pay | Admitting: Psychology

## 2016-09-18 DIAGNOSIS — I69319 Unspecified symptoms and signs involving cognitive functions following cerebral infarction: Secondary | ICD-10-CM

## 2016-09-18 DIAGNOSIS — R414 Neurologic neglect syndrome: Secondary | ICD-10-CM | POA: Diagnosis not present

## 2016-09-18 DIAGNOSIS — I69398 Other sequelae of cerebral infarction: Secondary | ICD-10-CM | POA: Diagnosis not present

## 2016-09-18 DIAGNOSIS — I63511 Cerebral infarction due to unspecified occlusion or stenosis of right middle cerebral artery: Secondary | ICD-10-CM | POA: Diagnosis not present

## 2016-09-18 DIAGNOSIS — Z5189 Encounter for other specified aftercare: Secondary | ICD-10-CM | POA: Diagnosis not present

## 2016-09-18 DIAGNOSIS — R269 Unspecified abnormalities of gait and mobility: Secondary | ICD-10-CM

## 2016-09-18 NOTE — Telephone Encounter (Signed)
New Message  Pt voiced he is needing a test but needs order in the system, ordered by tiffany greene but it was cancelled out.  Please f/u

## 2016-09-18 NOTE — Telephone Encounter (Signed)
I spoke with the patient- he states he spoke with [facility] last week and the 24 hour urine orders had been "retracted." I advised him I spoke with out lab tech today and she recalled taking the orders for 24 hour urine for metanephrines and catecholamines to [facility] on the 1st floor last week- these should be available to him to pick up his collection jug.  [facility] does not have the authority to cancel orders per our lab tech- he is aware that the order is still showing active in our system.  He will come later today to [facility] to pick up his jug. I have left a message for [facility] to call to confirm.

## 2016-09-18 NOTE — Progress Notes (Signed)
Patient:  Eric Pope   DOB: Aug 27, 1940  MR Number: [id number]  Location: [facility] [address] [place] [id number] [place] [address] Dept: [phone]  Start: 3 PM End: 4 PM  Provider/Observer:     Edgardo Roys PSYD  Chief Complaint:      Chief Complaint  Patient presents with  . Memory Loss  . Other    neglect syndrome and parkinson's symptoms    Reason For Service:     The patient is a 76 year old right-handed male who recently had a posterior right MCA tertiary infarct. The primary area identified by his MRA was in the parietal lobe. Small acute infarcts in inferior right temporal lobe were also identified. The patient also had symptoms of Parkinson's-like symptoms. The patient has attributed these parkinsonian-like symptoms to exposure to lead from consuming large amounts [place]. There is been a recommendation for Sinemet but the patient has declined so far. The patient was referred for follow-up psychotherapeutic interventions to help build coping skills and strategies around dealing with his recent CVA.  The patient came in 09/18/2016 along with brother.  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  The patient has started taking medications for his Parkinson like symptoms.  He reports that it has helped some of his symtpoms.  Interventions Strategy:  Cognitive/behavioral psychotherapeutic interventions and building various coping skills.  Participation Level:   Active  Participation Quality:  Appropriate      Behavioral Observation:  Well Groomed, Alert, and Appropriate.   Current Psychosocial Factors: The patient reports that he has continued to be isolated and now that he is not working this is more acute.  The patient wants to get involved in some social contact and has  also looked at things he could due within medicine.  Content of Session:   Reviewed current symptoms and work on therapeutic interventions to build better coping skills and strategies.  Current Status:   The patient reports that he does feel like he has improved a lot since the stroke but acknowledges that he is having some ongoing difficulties.  Patient Progress:   Stable and improving  Target Goals:   Target goals include building better coping skills and strategies particularly around compensating and dealing with some of the residual neuropsychological effects of his recent stroke.  Last Reviewed:   4/302018  Goals Addressed Today:    Today we worked on Therapist, occupational and strategies.  Impression/Diagnosis:   The patient has had right parietal and temporal CVA.  Language intact and basic knowledge and general fund of information intact.  Oriented x4.  However, his ability to synthesize facts and piceses of information into gestault is impaired.  His deficits are making it hard for hime to understand what has happened even though he can get the facts correct.  He is consumed in trying to find specific etiological factors to explain a complexity of issues.  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  The patient has started medications for Parkinson's.  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

## 2016-09-19 ENCOUNTER — Ambulatory Visit: Payer: Medicare Other | Attending: Physical Medicine & Rehabilitation

## 2016-09-19 ENCOUNTER — Other Ambulatory Visit: Payer: Medicare Other

## 2016-09-19 ENCOUNTER — Encounter (HOSPITAL_COMMUNITY): Payer: Self-pay | Admitting: Nurse Practitioner

## 2016-09-19 ENCOUNTER — Ambulatory Visit: Payer: Medicare Other | Admitting: Occupational Therapy

## 2016-09-19 ENCOUNTER — Ambulatory Visit (HOSPITAL_COMMUNITY)
Admission: RE | Admit: 2016-09-19 | Discharge: 2016-09-19 | Disposition: A | Discharge status: Home / Self Care | Payer: Medicare Other | Source: Ambulatory Visit | Attending: Nurse Practitioner | Admitting: Nurse Practitioner

## 2016-09-19 ENCOUNTER — Ambulatory Visit: Payer: Medicare Other | Admitting: Physical Therapy

## 2016-09-19 ENCOUNTER — Encounter: Payer: Self-pay | Admitting: Physical Therapy

## 2016-09-19 VITALS — BP 144/86 | HR 70 | Ht 68.0 in | Wt 182.6 lb

## 2016-09-19 VITALS — BP 162/88

## 2016-09-19 DIAGNOSIS — Z91013 Allergy to seafood: Secondary | ICD-10-CM | POA: Insufficient documentation

## 2016-09-19 DIAGNOSIS — Z91018 Allergy to other foods: Secondary | ICD-10-CM | POA: Diagnosis not present

## 2016-09-19 DIAGNOSIS — I4892 Unspecified atrial flutter: Secondary | ICD-10-CM | POA: Insufficient documentation

## 2016-09-19 DIAGNOSIS — R41842 Visuospatial deficit: Secondary | ICD-10-CM

## 2016-09-19 DIAGNOSIS — Z87442 Personal history of urinary calculi: Secondary | ICD-10-CM | POA: Diagnosis not present

## 2016-09-19 DIAGNOSIS — M6281 Muscle weakness (generalized): Secondary | ICD-10-CM

## 2016-09-19 DIAGNOSIS — R278 Other lack of coordination: Secondary | ICD-10-CM | POA: Diagnosis present

## 2016-09-19 DIAGNOSIS — Z9049 Acquired absence of other specified parts of digestive tract: Secondary | ICD-10-CM | POA: Insufficient documentation

## 2016-09-19 DIAGNOSIS — I69318 Other symptoms and signs involving cognitive functions following cerebral infarction: Secondary | ICD-10-CM | POA: Diagnosis present

## 2016-09-19 DIAGNOSIS — I4891 Unspecified atrial fibrillation: Secondary | ICD-10-CM | POA: Insufficient documentation

## 2016-09-19 DIAGNOSIS — H53462 Homonymous bilateral field defects, left side: Secondary | ICD-10-CM | POA: Insufficient documentation

## 2016-09-19 DIAGNOSIS — R2689 Other abnormalities of gait and mobility: Secondary | ICD-10-CM

## 2016-09-19 DIAGNOSIS — R29818 Other symptoms and signs involving the nervous system: Secondary | ICD-10-CM | POA: Insufficient documentation

## 2016-09-19 DIAGNOSIS — I10 Essential (primary) hypertension: Secondary | ICD-10-CM | POA: Diagnosis not present

## 2016-09-19 DIAGNOSIS — Z91040 Latex allergy status: Secondary | ICD-10-CM | POA: Insufficient documentation

## 2016-09-19 DIAGNOSIS — R41844 Frontal lobe and executive function deficit: Secondary | ICD-10-CM | POA: Insufficient documentation

## 2016-09-19 DIAGNOSIS — Z91011 Allergy to milk products: Secondary | ICD-10-CM | POA: Insufficient documentation

## 2016-09-19 DIAGNOSIS — Z881 Allergy status to other antibiotic agents status: Secondary | ICD-10-CM | POA: Diagnosis not present

## 2016-09-19 DIAGNOSIS — R29898 Other symptoms and signs involving the musculoskeletal system: Secondary | ICD-10-CM | POA: Insufficient documentation

## 2016-09-19 DIAGNOSIS — Z7902 Long term (current) use of antithrombotics/antiplatelets: Secondary | ICD-10-CM | POA: Diagnosis not present

## 2016-09-19 DIAGNOSIS — Z8249 Family history of ischemic heart disease and other diseases of the circulatory system: Secondary | ICD-10-CM | POA: Diagnosis not present

## 2016-09-19 DIAGNOSIS — Z8673 Personal history of transient ischemic attack (TIA), and cerebral infarction without residual deficits: Secondary | ICD-10-CM | POA: Diagnosis not present

## 2016-09-19 DIAGNOSIS — I69354 Hemiplegia and hemiparesis following cerebral infarction affecting left non-dominant side: Secondary | ICD-10-CM

## 2016-09-19 DIAGNOSIS — I480 Paroxysmal atrial fibrillation: Secondary | ICD-10-CM | POA: Diagnosis not present

## 2016-09-19 DIAGNOSIS — R41841 Cognitive communication deficit: Secondary | ICD-10-CM | POA: Insufficient documentation

## 2016-09-19 DIAGNOSIS — Z833 Family history of diabetes mellitus: Secondary | ICD-10-CM | POA: Insufficient documentation

## 2016-09-19 NOTE — Therapy (Signed)
[facility] [address] [place], [place], [address] Phone: [phone]   Fax:  [phone]  Occupational Therapy Treatment  Patient Details  Name: Eric Pope MRN: [id number] Date of Birth: [birth year] Referring Provider: Dr Alysia Penna  Encounter Date: 09/19/2016      OT End of Session - 09/19/16 1049    Visit Number 11   Number of Visits 19   Date for OT Re-Evaluation 09/30/16   Authorization Type BC/BS Medicare G Code    Authorization Time Period 08/02/16-09/30/16   Authorization - Visit Number 11   Authorization - Number of Visits 19   OT Start Time 0936   OT Stop Time 1015   OT Time Calculation (min) 39 min   Activity Tolerance Patient tolerated treatment well   Behavior During Therapy [facility] for tasks assessed/performed      Past Medical History:  Diagnosis Date  . Chronic left shoulder pain   . Hypertension   . Kidney stones   . Lead poisoning   . Nephrolithiasis   . 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 Vision abnormalities     Past Surgical History:  Procedure Laterality Date  . CHOLECYSTECTOMY    . LITHOTRIPSY      There were no vitals filed for this visit.      Subjective Assessment - 09/19/16 1045    Subjective  Pt's brother [name] was present today and provided with infor on SCAT, mobile meals and community resources   Patient Stated Goals Pt goals = Improved cognition, visual perception.   Currently in Pain? No/denies              Treatment: copying small peg design in a quiet environment, pt demonstrated improved performance requiring 3-4 v.c to correctly complete design. Supine PWR! Up, rock and twist, mod v.c for positioning 10-20 reps each Education with pt/ brother regarding community resources and why pt will likely not qualify  for home health services.                  OT Short Term Goals - 09/12/16 1123      OT SHORT TERM GOAL #1   Title Pt will  be mod I basic simulated ADL (dressing) all aspects w/o vc's --check STGs 09/05/16   Time 4   Period Weeks   Status Achieved     OT SHORT TERM GOAL #2   Title Pt will be Mod I memory strategies and implement during OT sessions   Time 4   Period Weeks   Status Partially Met  Pt verbalizes understanding     OT SHORT TERM GOAL #3   Title Pt will be Mod I simple snack/meal prep adhering to safety and attending to left side with min v.c.   Time 4   Period Weeks   Status Partially Met     OT SHORT TERM GOAL #4   Title Pt will perform mod complex tabletop scanning activities with  95% or better accuracy.   Time 4   Period Weeks   Status On-going     OT SHORT TERM GOAL #5   Title Pt will verbalize understanding of compensatory strategies for visual impairment.   Time 4   Period Weeks   Status On-going     OT SHORT TERM GOAL #6   Title i with initial HEP.   Time 4   Status On-going           OT  Long Term Goals - 09/19/16 1048      OT LONG TERM GOAL #1   Title Pt will navigate a busy environment and perform environmental scanning with 90% or better accuracy.   Time 8   Period Weeks   Status Revised     OT LONG TERM GOAL #2   Title Pt will be Mod I higher level cognitive tasks related to finances (bill pay and balancing a check book).   Time 8   Period Weeks   Status New     OT LONG TERM GOAL #3   Title Pt will demonstrate improved coordination on bilaterally  as seen by improved 9 Hold Peg score by 10 seconds in prep for functional use.   Baseline 07/25/16 = Left 49.03 seconds affected side (R = 108.84 seconds)    Time 8   Period Weeks   Status Revised     OT LONG TERM GOAL #4   Title Pt will be Mod I simple meal prep (fry and egg, make coffee) on stovetop while maintaining safety    Time 8   Period Weeks   Status On-going     OT LONG TERM GOAL #5   Title Pt will demonstrate ability to retrieve a lightweight object at 85* shoulder flexion with -10 elbow  extension with his RUE.   Time 8   Period Weeks   Status New     OT LONG TERM GOAL #6   Title Pt will demonstrate ability to perform a physical and cognitive task simultaneously with 85% or better accuracy.   Time 8   Period Weeks   Status New               Plan - 09/19/16 1046    Clinical Impression Statement Pt's brother was presnt today. Therapist provided pt/ brother with information regarding SCAT, mobile meals and adult [facility].   Rehab Potential Fair   Clinical Impairments Affecting Rehab Potential has hired caregiver 10 hrs per week   OT Frequency 2x / week   OT Duration 8 weeks   OT Treatment/Interventions Self-care/ADL training;Therapeutic exercise;Patient/family education;Neuromuscular education;Balance training;Therapeutic exercises;DME and/or AE instruction;Therapeutic activities;Cognitive remediation/compensation;Visual/perceptual remediation/compensation   Plan review ball ex   OT Home Exercise Plan PWR! moves in supine, shoulder flex with ball in sitting 08/28/16   Consulted and Agree with Plan of Care Patient   Family Member Ward      Patient will benefit from skilled therapeutic intervention in order to improve the following deficits and impairments:  Decreased endurance, Impaired vision/preception, Decreased knowledge of precautions, Impaired perceived functional ability, Decreased activity tolerance, Decreased knowledge of use of DME, Decreased strength, Impaired sensation, Decreased balance, Decreased cognition, Decreased range of motion, Decreased coordination, Decreased safety awareness, Impaired UE functional use  Visit Diagnosis: Muscle weakness (generalized)  Other lack of coordination  Visuospatial deficit  Hemiplegia and hemiparesis following cerebral infarction affecting left non-dominant side [facility])    Problem List Patient Active Problem List   Diagnosis Date Noted  . 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 Gait disturbance, post-stroke 08/04/2016  . Hypertensive crisis   . PAF (paroxysmal atrial fibrillation) ([place])   . 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 Paroxysmal atrial fibrillation (HCC)   . 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 Acute ischemic stroke ([place])   . CVA (cerebral vascular accident) ([place]) 07/10/2016  . Nonallopathic lesion of sacroiliac  region 01/09/2013  . Greater trochanteric bursitis of left hip 07/09/2012  . Nonallopathic lesion of lumbosacral region 05/07/2012  . 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 Left hip pain 01/10/2012  . Rotator cuff syndrome of right shoulder 11/09/2011  . Bicipital tendinitis 11/09/2011  . Parkinson's disease ([place]) 11/09/2011  . Hypertension 11/09/2011    RINE,KATHRYN 09/19/2016, 10:50 AM  [facility] [address] [place], [place], [address] Phone: [phone]   Fax:  [phone]  Name: JAYCEION LISENBY MRN: [id number] Date of Birth: 01/21/1941

## 2016-09-19 NOTE — Patient Instructions (Signed)
Functional activities for attention  . Sorting clean silverware or dishes from the dishwasher  . Folding/organizing clean clothes or separating dirty clothes  . Organizing anything (kitchen drawers/cupboards, past bills, dresser drawers, pens, closet)  . Dialing phone numbers  . Counting large amounts of change (vary # of coins upon level of attention)  . Alphabetizing anything (business cards, addresses, etc.)  . Looking numbers up in phone book  . Addition/subtraction worksheets (vary the complexity of the problems upon the level of attention)  . Finding TV shows in a grid/TV guide  . Word Searches (vary the size of the search field upon the level of attention)  . Visual tracking worksheets (vary the number of  letters/numbers/symbols upon level of attention) The person can track numbers on a spreadsheet to make this more functional as they progress to higher levels  . Card games - anything from separating suits to War to [place], to [employer], to [employer].  Keep in mind more steps to the "turn" = more challenging (Solitaire is harder than War)

## 2016-09-19 NOTE — Therapy (Signed)
[facility] [address] [place], [place], [address] Phone: [phone]   Fax:  [phone]  Physical Therapy Treatment  Patient Details  Name: Eric Pope MRN: [id number] Date of Birth: August 09, 1940 Referring Provider: Dr. Alysia Penna  Encounter Date: 09/19/2016      PT End of Session - 09/19/16 1344    Visit Number 11  G5   Number of Visits 14   Date for PT Re-Evaluation 09/22/16   Authorization Time Period 08/25/16 to 10/24/16   PT Start Time 1105  patient was in speech therapy    PT Stop Time 1147   PT Time Calculation (min) 42 min   Equipment Utilized During Treatment Gait belt   Activity Tolerance Patient tolerated treatment well   Behavior During Therapy [facility] for tasks assessed/performed      Past Medical History:  Diagnosis Date  . Chronic left shoulder pain   . Hypertension   . Kidney stones   . Lead poisoning   . Nephrolithiasis   . 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 Vision abnormalities     Past Surgical History:  Procedure Laterality Date  . CHOLECYSTECTOMY    . LITHOTRIPSY      Vitals:   09/19/16 1111  BP: (!) 162/88        Subjective Assessment - 09/19/16 1214    Subjective Patient denies any new complaints or falls since last PT session.    Patient is accompained by: Family member  brother - [name]    Pertinent History Rt MCA (parietal) CVA with Left visual field cut; afib; HTN; tremor due to lead poisoning; Parkinsonism   Currently in Pain? No/denies            [facility] PT Assessment - 09/19/16 1105      6 Minute Walk- Baseline   6 Minute Walk- Baseline yes   BP (mmHg) 162/88   HR (bpm) 58   02 Sat (%RA) 97 %   Modified Borg Scale for Dyspnea 0- Nothing at all  RR=18     6 Minute walk- Post Test   6 Minute Walk Post Test yes   BP (mmHg) (!)  164/94   HR (bpm) 64   02 Sat (%RA) 97 %   Modified Borg Scale for Dyspnea 2- Mild shortness of breath  RR = 26     6 minute walk test results    Aerobic Endurance Distance Walked 1681                     [facility] Adult PT Treatment/Exercise - 09/19/16 1105      Transfers   Transfers Sit to Stand;Stand to Sit   Sit to Stand 6: Modified independent (Device/Increase time);Without upper extremity assist;From chair/3-in-1   Stand to Sit 6: Modified independent (Device/Increase time);Without upper extremity assist     Ambulation/Gait   Ambulation/Gait Yes   Ambulation/Gait Assistance 7: Independent   Ambulation Distance (Feet) 1681 Feet  see 6 minute walk test for details    Assistive device None   Gait Pattern Step-through pattern;Decreased arm swing - right;Decreased step length - right;Decreased step length - left;Right flexed knee in stance;Left flexed knee in stance;Decreased trunk rotation   Ambulation Surface Level;Indoor   Gait Comments PT cued patient to ambualte through busy environment (through treatment tables/around obstacles) while naming animals A-Z. Periodically, PT would ask patient questions, then have patient return to naming animals. Patient made it thorugh the entire alphabet  without PT cueing patient for letters. Patient was occasionally unable to name an animal for a letter, but this happened <10% of the time. Patient demonstrated ability to negotiate obstacles and maintain gait speed while completing cognitive tasks. PT intentionally cued patient to attend to objects on the L side, and patient demonstrated ability to complete ambulation task while attending to L side.     Posture/Postural Control   Posture/Postural Control Postural limitations   Postural Limitations Rounded Shoulders;Forward head   Posture Comments RUE tremor more pronounced     High Level Balance   High Level Balance Activities --   High Level Balance Comments --     Neuro Re-ed    Neuro Re-ed Details  --                PT Education - 09/19/16 1131    Education provided Yes   Education  Details patient asked about returning to working out at [employer] and PT educated to wait for clearance from MD and then PT will perform exercises here (in PT clinic) prior to going to the gym on his own    Person(s) Educated Patient;Other (comment)  patient's brother [name])   Methods Explanation   Comprehension Verbalized understanding;Verbal cues required          PT Short Term Goals - 07/25/16 2228      PT SHORT TERM GOAL #1   Title see LTGs           PT Long Term Goals - 09/19/16 1352      PT LONG TERM GOAL #1   Title Demonstrate independence in HEP. (TARGET 09/08/16) New target for all LTG's 09/22/16   Time 4   Period Weeks   Status On-going     PT LONG TERM GOAL #2   Title Demonstrate decreased fall risk and improved gait/balance with increase FGA score to >=22/30;    4/20 21/30   Time 4   Period Weeks   Status On-going     PT LONG TERM GOAL #3   Title Patient able to walk independently in moderately distracting environment while attending to Left visual field to avoid running into/tripping over obstacles.   Baseline MET 09/19/2016    Time 4   Period Weeks   Status Achieved     PT LONG TERM GOAL #4   Title Patient to demonstrate 10% improvement in distance in 6 MWT with vital signs stable. (09/19/2016: 1681 feet (>10% increase from baseline), but vitals unstable BP: 164/94; HR: 58bpm; O2 sat: 97%)   Baseline --   Time 4   Period Weeks   Status On-going               Plan - 09/19/16 1133    Clinical Impression Statement Today's skilled PT session focued on beginning to assess the patient's long term goals. The patient demonstrated ability to ambulate in a distracting environment while attending to the L side while completing cognitive tasks while negotiating around obstacles. PT completed 6 minute walk test with patient, and he completed 1681 feet with an improvement in his post-6 minute walk test vitals (BP:164/94; HR: 64bpm). PT will plan to continue to assess  remaining long term goals at the next PT session. Patient is progressing towards goals, and will benefit from continued skilled PT at this time.   Rehab Potential Good   Clinical Impairments Affecting Rehab Potential decr cognition and awareness of deficits   PT Frequency 2x / week  PT Duration 4 weeks   PT Treatment/Interventions ADLs/Self Care Home Management;Gait training;Stair training;Functional mobility training;Therapeutic activities;Therapeutic exercise;Balance training;Neuromuscular re-education;Cognitive remediation;Patient/family education;Visual/perceptual remediation/compensation   PT Next Visit Plan G-code not due until 15th visit (see 4/6 renewal); assess remaining LTGs; recertify    Consulted and Agree with Plan of Care Patient;Family member/caregiver   Family Member Consulted brother - [name]       Patient will benefit from skilled therapeutic intervention in order to improve the following deficits and impairments:  Abnormal gait, Decreased balance, Decreased cognition, Decreased safety awareness, Decreased strength, Impaired vision/preception, Postural dysfunction  Visit Diagnosis: Muscle weakness (generalized)  Other abnormalities of gait and mobility  Other lack of coordination  Visuospatial deficit  Hemiplegia and hemiparesis following cerebral infarction affecting left non-dominant side [facility])     Problem List Patient Active Problem List   Diagnosis Date Noted  . 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 Gait disturbance, post-stroke 08/04/2016  . Hypertensive crisis   . PAF (paroxysmal atrial fibrillation) ([place])   . 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 Paroxysmal atrial fibrillation (HCC)   . 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 Acute ischemic stroke ([place])   . CVA (cerebral vascular accident) ([place]) 07/10/2016  . Nonallopathic lesion of sacroiliac region 01/09/2013  .  Greater trochanteric bursitis of left hip 07/09/2012  . Nonallopathic lesion of lumbosacral region 05/07/2012  . 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 Left hip pain 01/10/2012  . Rotator cuff syndrome of right shoulder 11/09/2011  . Bicipital tendinitis 11/09/2011  . Parkinson's disease ([place]) 11/09/2011  . Hypertension 11/09/2011    Arelia Sneddon, SPT 09/19/2016, 2:59 PM  [facility] [address] [place], [place], [address] Phone: [phone]   Fax:  [phone]  Name: Eric Pope MRN: [id number] Date of Birth: 03/26/1941

## 2016-09-19 NOTE — Therapy (Signed)
[facility] [address] [place], [place], [address] Phone: [phone]   Fax:  [phone]  Speech Language Pathology Treatment  Patient Details  Name: Eric Pope MRN: [id number] Date of Birth: 1941-04-29 Referring Provider: Dr. Alysia Penna  Encounter Date: 09/19/2016      End of Session - 09/19/16 1610    Visit Number 11   Number of Visits 17   Date for SLP Re-Evaluation 09/22/16   SLP Start Time 1018   SLP Stop Time  1101   SLP Time Calculation (min) 43 min   Activity Tolerance Patient tolerated treatment well      Past Medical History:  Diagnosis Date  . Chronic left shoulder pain   . Hypertension   . Kidney stones   . Lead poisoning   . Nephrolithiasis   . 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 Vision abnormalities     Past Surgical History:  Procedure Laterality Date  . CHOLECYSTECTOMY    . LITHOTRIPSY      There were no vitals filed for this visit.      Subjective Assessment - 09/19/16 1022    Subjective "Those puzzles Cecile gave me are hard. [name] and Donna Christen also have trouble." Neuropsych testing 10-13-16.   Patient is accompained by: Family member   Special Tests brother [name]   Currently in Pain? Yes   Pain Score 4    Pain Location Shoulder   Pain Orientation Right   Pain Descriptors / Indicators Aching   Pain Type Chronic pain   Pain Onset More than a month ago   Pain Frequency Several days a week   Aggravating Factors  abduction   Pain Relieving Factors repositioning               ADULT SLP TREATMENT - 09/19/16 1024      General Information   Behavior/Cognition Cooperative     Treatment Provided   Treatment provided Cognitive-Linquistic     Cognitive-Linquistic Treatment   Treatment focused on Cognition;Patient/family/caregiver education   Skilled Treatment Discussion today with pt/brother re: Dr. Sima Matas yesterday and his recommendations. SLP added  concern that impulsivity and decr'd attention play a role in all of these areas and could sereiously impact whether or not pt is able to return to work in any capacity whatsoever. SLP reiterated that parkinson's has effect on cognitive linguistics and pt may have had some premorbid differences in cognitive linguistics and compensated to the point of not readily apparent he had difficulties. Homewrok was provided. SLP stressed that impulsivity and decr'd attention skills both are hindering further progress at this time. SLP reviewed with pt/brother functional activities for attention handout.     Assessment / Recommendations / Plan   Plan Continue with current plan of care     Progression Toward Goals   Progression toward goals --  pt indicates he very much wants to improve          SLP Education - 09/19/16 1609    Education provided Yes   Education Details functional activities for attention, cognitive linguistic deficits role in consideration in return to work   [employer]) Educated Patient  brother [name]   Methods Explanation;Demonstration   Comprehension Verbalized understanding;Need further instruction  needs instruction - pt          SLP Short Term Goals - 09/12/16 1241      SLP SHORT TERM GOAL #1   Title Pt will verbalize 3 cognitive impairment with  occasional min A over 3 sessions.    Status Not Met     SLP SHORT TERM GOAL #2   Title Pt will solve simple functional reasoning, organization and attention to detail problems with 85% accuracy and occasional min A   Status Not Met     SLP SHORT TERM GOAL #3   Title Pt will demo selective attention for mod complex task for 5 minutes in quiet environment with rare min A back to task   Status Not Met          SLP Long Term Goals - 09/19/16 1612      SLP LONG TERM GOAL #1   Title Pt will ID errors on cognitive linguistic tasks with occasional min A   Time 2   Period Weeks   Status On-going     SLP LONG TERM GOAL #2    Title Pt will solve simple math, reasoning and organization tasks with 80% accuracy and occasional min A   Time 2   Period Weeks   Status Revised     SLP LONG TERM GOAL #3   Title Pt will attend to 8 minute simple tasks with occasional min A, resulting in at least 90% success   Time 2   Period Weeks   Status Revised     SLP LONG TERM GOAL #4   Title Pt will verbalize 3 cognitive impairment with occasional min A over 3 sessions.    Baseline 09/08/16   Time 2   Period Weeks   Status On-going     SLP LONG TERM GOAL #5   Title Pt will solve simple functional reasoning, organization and attention to detail problems with 85% accuracy and occasional min A   Time 2   Period Weeks   Status On-going     SLP LONG TERM GOAL #6   Title Pt will demo selective attention for mod complex task for 5 minutes in quiet environment with rare min A back to task   Time 2   Period Weeks   Status On-going     SLP LONG TERM GOAL #7   Title Pt will demo selective attention for mod complex task for 5 minutes in quiet environment with rare min A back to task   Time 2   Period Weeks   Status On-going          Plan - 09/19/16 1610    Clinical Impression Statement Pt continues to require verbal cues to maintain attention to task due to decr'd attention and impulsivity. SLP continues to believe pt would be best served by 24/7 supervision. Continued ST intervention is recommended, targeting cognitive linguistic skills for safety and independence, as well as reduced caregiver burden.   Speech Therapy Frequency 2x / week   Duration --  8 weeks   Treatment/Interventions Compensatory strategies;Functional tasks;Cognitive reorganization;Internal/external aids;SLP instruction and feedback;Patient/family education   Potential to Achieve Goals Fair   Potential Considerations Ability to learn/carryover information;Family/community support;Previous level of function;Cooperation/participation level;Severity of  impairments      Patient will benefit from skilled therapeutic intervention in order to improve the following deficits and impairments:   Cognitive communication deficit    Problem List Patient Active Problem List   Diagnosis Date Noted  . 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 Gait disturbance, post-stroke 08/04/2016  . Hypertensive crisis   . PAF (paroxysmal atrial fibrillation) ([place])   . 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  Paroxysmal atrial fibrillation (HCC)   . 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 Acute ischemic stroke ([place])   . CVA (cerebral vascular accident) ([place]) 07/10/2016  . Nonallopathic lesion of sacroiliac region 01/09/2013  . Greater trochanteric bursitis of left hip 07/09/2012  . Nonallopathic lesion of lumbosacral region 05/07/2012  . 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 Left hip pain 01/10/2012  . Rotator cuff syndrome of right shoulder 11/09/2011  . Bicipital tendinitis 11/09/2011  . Parkinson's disease ([place]) 11/09/2011  . Hypertension 11/09/2011    [facility] ,[place], [place]  09/19/2016, 4:13 PM  [facility] [address] [place], [place], [address] Phone: [phone]   Fax:  [phone]   Name: Eric Pope MRN: [id number] Date of Birth: 22-Sep-1940

## 2016-09-20 NOTE — Progress Notes (Signed)
Primary Care Physician: Vladimir Creeks, MD Referring Physician: Dr. Caryl Comes Neurology:Dr. Sim Choquette is a 76 y.o. male with a h/o afib and prior stroke that asked to be seen in the afib clinic after V/S showed an increase and irregularity of heart rate on the 18 th of April. EKG shows aflutter with variable AV block.Per pt he is not symptomatic. He has refused anticoagulation in the past for desire to use a "natural" blood thinner. His heart rate is not that high in the 90's but could benefit from rate control as he is not currently not on any rate control drugs. Pt denies any alcohol, tobacco or excessive caffeine.  F/u 5/1. Ekg,despite heavy artifact from tremors, shows identifiable P waves. Pt states that he thinks he went back to Normal rhythm several days ago. His heart rate and BP att home have been normal. He was started on metoprolol at last visit as well as eliquis and is tolerating both.  Today, he denies symptoms of palpitations, chest pain, shortness of breath, orthopnea, PND, lower extremity edema, dizziness, presyncope, syncope, or neurologic sequela. The patient is tolerating medications without difficulties and is otherwise without complaint today.   Past Medical History:  Diagnosis Date  . Chronic left shoulder pain   . Hypertension   . Kidney stones   . Lead poisoning   . Nephrolithiasis   . 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 Vision abnormalities    Past Surgical History:  Procedure Laterality Date  . CHOLECYSTECTOMY    . LITHOTRIPSY      Current Outpatient Prescriptions  Medication Sig Dispense Refill  . apixaban (ELIQUIS) 5 MG TABS tablet Take 1 tablet (5 mg total) by mouth 2 (two) times daily. 60 tablet 0  . Carbidopa-Levodopa (RYTARY PO) Take by mouth 3 (three) times daily.    . folic acid (FOLVITE) 1 MG tablet Take 1 tablet (1 mg total) by mouth daily. 30 tablet 0  . hydrochlorothiazide (MICROZIDE) 12.5 MG capsule Take 1 capsule (12.5 mg  total) by mouth daily as needed (for blood pressure). 30 capsule 1  . Melatonin 3 MG TABS Take 2 tablets (6 mg total) by mouth at bedtime. 30 tablet 0  . metoprolol succinate (TOPROL XL) 25 MG 24 hr tablet Take 0.5 tablets (12.5 mg total) by mouth daily. 30 tablet 1  . NON FORMULARY Vitamin C powder mixed with sodium bicarb: Drink daily as directed    . OVER THE COUNTER MEDICATION Hemp oil: One squirt under the tongue twice a day to treat tremors    . spironolactone (ALDACTONE) 25 MG tablet Take 12.5 mg by mouth daily.    Marland Kitchen telmisartan (MICARDIS) 80 MG tablet Take 80 mg by mouth daily.     No current facility-administered medications for this encounter.     Allergies  Allergen Reactions  . Fish-Derived Products Other (See Comments)    Mercury content poisoned his body  . Kale Other (See Comments)    Caused toxicity in the body  . Milk-Related Compounds Other (See Comments)    Increases phlegm  . Ciprofloxacin Other (See Comments)    Causes headaches  . Latex Itching    Social History   Social History  . Marital status: Divorced    Spouse name: N/A  . Number of children: N/A  . Years of education: N/A   Occupational History  . Not on file.   Social History Main Topics  . Smoking status: Never Smoker  .  Smokeless tobacco: Never Used  . Alcohol use Yes     Comment: wine occasionally  . Drug use: No  . Sexual activity: Not on file   Other Topics Concern  . Not on file   Social History Narrative  . No narrative on file    Family History  Problem Relation Age of Onset  . Heart disease Brother   . Diabetes Brother   . Diabetes Maternal Grandmother   . Heart attack Maternal Grandfather   . Heart attack Paternal Grandfather     ROS- All systems are reviewed and negative except as per the HPI above  Physical Exam: Vitals:   09/19/16 1450  BP: (!) 144/86  Pulse: 70  Weight: 182 lb 9.6 oz (82.8 kg)  Height: 5\' 8"  (1.727 m)   Wt Readings from Last 3 Encounters:   09/19/16 182 lb 9.6 oz (82.8 kg)  09/14/16 181 lb (82.1 kg)  09/14/16 181 lb 3.2 oz (82.2 kg)    Labs: Lab Results  Component Value Date   NA 143 07/17/2016   K 4.0 07/17/2016   CL 110 07/17/2016   CO2 28 07/17/2016   GLUCOSE 99 07/17/2016   BUN 16 07/17/2016   CREATININE 0.99 07/17/2016   CALCIUM 9.3 07/17/2016   Lab Results  Component Value Date   INR 1.04 07/10/2016   Lab Results  Component Value Date   CHOL 197 07/11/2016   HDL 66 07/11/2016   LDLCALC 118 (H) 07/11/2016   TRIG 65 07/11/2016     GEN- The patient is well appearing, alert and oriented x 3 today.  Head- normocephalic, atraumatic Eyes-  Sclera clear, conjunctiva pink Ears- hearing intact Oropharynx- clear Neck- supple, no JVP Lymph- no cervical lymphadenopathy Lungs- Clear to ausculation bilaterally, normal work of breathing Heart-  regular rate and rhythm, no murmurs, rubs or gallops, PMI not laterally displaced GI- soft, NT, ND, + BS Extremities- no clubbing, cyanosis, or edema, tremors of upper extremities MS- no significant deformity or atrophy Skin- no rash or lesion Psych- euthymic mood, full affect Neuro- strength and sensation are intact  EKG- heavy artifact from tremors but identifiable P waves are identified.Rate 70 bpm, pr int 174 ms, qrs int 86 ms, qtc 203 ms Epic records reviewed    Assessment and Plan: 1. H/o afib/flutter In ER today Continue eliquis 5 mg for chadsvasc score of at least 4 Does not give a bleeding history Bleeding precautions discussed  He has stopped his "natural" blood thinner Continue metoprolol ER 25 mg 1/2 tab a day for rate control  F/u in afib clinic x one month with CBC  Eric Pope, [facility] [address] [place], [place] [address] [phone]

## 2016-09-21 ENCOUNTER — Ambulatory Visit: Payer: Medicare Other | Admitting: Occupational Therapy

## 2016-09-21 ENCOUNTER — Telehealth: Payer: Self-pay | Admitting: Internal Medicine

## 2016-09-21 ENCOUNTER — Ambulatory Visit: Payer: Medicare Other | Admitting: Speech Pathology

## 2016-09-21 ENCOUNTER — Ambulatory Visit: Payer: Medicare Other | Admitting: Physical Therapy

## 2016-09-21 VITALS — BP 176/78

## 2016-09-21 DIAGNOSIS — R2689 Other abnormalities of gait and mobility: Secondary | ICD-10-CM

## 2016-09-21 DIAGNOSIS — R278 Other lack of coordination: Secondary | ICD-10-CM

## 2016-09-21 DIAGNOSIS — R41841 Cognitive communication deficit: Secondary | ICD-10-CM

## 2016-09-21 DIAGNOSIS — I69354 Hemiplegia and hemiparesis following cerebral infarction affecting left non-dominant side: Secondary | ICD-10-CM

## 2016-09-21 DIAGNOSIS — M6281 Muscle weakness (generalized): Secondary | ICD-10-CM

## 2016-09-21 DIAGNOSIS — R41842 Visuospatial deficit: Secondary | ICD-10-CM

## 2016-09-21 NOTE — Patient Instructions (Signed)
   Cognitive Activities you can do at home:   - [place] (easy level)  - [place]

## 2016-09-21 NOTE — Patient Instructions (Signed)
Seated hold a ball between your hands , raise arms up to just below shoulder height without elevating shoulder or arching back,then  lower ball back to your lap. Keep elbows straight.   Perform 2 sets of 10 reps 1x day  Seated hold a ball in between both hands, straighten elbows, then bend elbows and bring ball to your chest     Perform 2 sets of 10 reps 1x day

## 2016-09-21 NOTE — Therapy (Signed)
[facility] [address] [place], [place], [address] Phone: [phone]   Fax:  [phone]  Occupational Therapy Treatment  Patient Details  Name: Eric Pope MRN: [id number] Date of Birth: 06-04-1940 Referring Provider: Dr Alysia Penna  Encounter Date: 09/21/2016      OT End of Session - 09/21/16 1144    Visit Number 12   Number of Visits 19   Date for OT Re-Evaluation 09/30/16   Authorization Type BC/BS Medicare G Code    Authorization Time Period 08/02/16-09/30/16   Authorization - Visit Number 12   Authorization - Number of Visits 66   OT Start Time 0936   OT Stop Time 1015   OT Time Calculation (min) 39 min   Activity Tolerance Patient tolerated treatment well   Behavior During Therapy Impulsive      Past Medical History:  Diagnosis Date  . Chronic left shoulder pain   . Hypertension   . Kidney stones   . Lead poisoning   . Nephrolithiasis   . 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 Vision abnormalities     Past Surgical History:  Procedure Laterality Date  . CHOLECYSTECTOMY    . LITHOTRIPSY      Vitals:   09/21/16 0946  BP: (!) 176/78        Subjective Assessment - 09/21/16 0946    Subjective  Pt reports eating breakfast today   Patient Stated Goals Pt goals = Improved cognition, visual perception.   Currently in Pain? No/denies            Treatment: Completing a 24 piece puzzle, mod v.c. And increased time required. Simple pathfinding task, with pt correctly locating locations in the building yet he ambulated too fast without scanning.Therapist discussed the safety implications with pt.                  OT Education - 09/21/16 1140    Education provided Yes   Education Details PWR! up in seated reviewed / updated ball ex - see pt instructions (10-20 reps with min-mod v.c.)   Person(s) Educated Patient   Methods Explanation;Demonstration;Verbal cues;Handout    Comprehension Verbalized understanding;Returned demonstration;Verbal cues required          OT Short Term Goals - 09/21/16 0955      OT SHORT TERM GOAL #1   Title Pt will be mod I basic simulated ADL (dressing) all aspects w/o vc's --check STGs 09/05/16   Time 4   Period Weeks   Status Achieved     OT SHORT TERM GOAL #2   Title Pt will be Mod I memory strategies and implement during OT sessions   Time 4   Period Weeks   Status Partially Met  Pt verbalizes understanding     OT SHORT TERM GOAL #3   Title Pt will be Mod I simple snack/meal prep adhering to safety and attending to left side with min v.c.   Time 4   Period Weeks   Status Partially Met     OT SHORT TERM GOAL #4   Title Pt will perform mod complex tabletop scanning activities with  95% or better accuracy.   Time 4   Period Weeks   Status On-going     OT SHORT TERM GOAL #5   Title Pt will verbalize understanding of compensatory strategies for visual impairment.   Time 4   Period Weeks   Status On-going     OT  SHORT TERM GOAL #6   Title i with initial HEP.   Time 4   Status On-going           OT Long Term Goals - 09/19/16 1048      OT LONG TERM GOAL #1   Title Pt will navigate a busy environment and perform environmental scanning with 90% or better accuracy.   Time 8   Period Weeks   Status Revised     OT LONG TERM GOAL #2   Title Pt will be Mod I higher level cognitive tasks related to finances (bill pay and balancing a check book).   Time 8   Period Weeks   Status New     OT LONG TERM GOAL #3   Title Pt will demonstrate improved coordination on bilaterally  as seen by improved 9 Hold Peg score by 10 seconds in prep for functional use.   Baseline 07/25/16 = Left 49.03 seconds affected side (R = 108.84 seconds)    Time 8   Period Weeks   Status Revised     OT LONG TERM GOAL #4   Title Pt will be Mod I simple meal prep (fry and egg, make coffee) on stovetop while maintaining safety     Time 8   Period Weeks   Status On-going     OT LONG TERM GOAL #5   Title Pt will demonstrate ability to retrieve a lightweight object at 85* shoulder flexion with -10 elbow extension with his RUE.   Time 8   Period Weeks   Status New     OT LONG TERM GOAL #6   Title Pt will demonstrate ability to perform a physical and cognitive task simultaneously with 85% or better accuracy.   Time 8   Period Weeks   Status New               Plan - 09/21/16 0948    Clinical Impression Statement Pt is progressing slowly towards goals limited by cognitive and visual perceptual deficits   Rehab Potential Fair   Clinical Impairments Affecting Rehab Potential has hired caregiver 10 hrs per week   OT Frequency 2x / week   OT Duration 8 weeks   OT Treatment/Interventions Self-care/ADL training;Therapeutic exercise;Patient/family education;Neuromuscular education;Balance training;Therapeutic exercises;DME and/or AE instruction;Therapeutic activities;Cognitive remediation/compensation;Visual/perceptual remediation/compensation   Plan review ball exercises, environmental scanning   OT Home Exercise Plan PWR! moves in supine, shoulder flex with ball in sitting 08/28/16   Consulted and Agree with Plan of Care Patient      Patient will benefit from skilled therapeutic intervention in order to improve the following deficits and impairments:  Decreased endurance, Impaired vision/preception, Decreased knowledge of precautions, Impaired perceived functional ability, Decreased activity tolerance, Decreased knowledge of use of DME, Decreased strength, Impaired sensation, Decreased balance, Decreased cognition, Decreased range of motion, Decreased coordination, Decreased safety awareness, Impaired UE functional use  Visit Diagnosis: Muscle weakness (generalized)  Visuospatial deficit  Hemiplegia and hemiparesis following cerebral infarction affecting left non-dominant side (HCC)  Other lack of  coordination    Problem List Patient Active Problem List   Diagnosis Date Noted  . 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 Gait disturbance, post-stroke 08/04/2016  . Hypertensive crisis   . PAF (paroxysmal atrial fibrillation) (HCC)   . 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 Paroxysmal atrial fibrillation (HCC)   . 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 Acute ischemic  stroke ([place])   . CVA (cerebral vascular accident) ([employer]) 07/10/2016  . Nonallopathic lesion of sacroiliac region 01/09/2013  . Greater trochanteric bursitis of left hip 07/09/2012  . Nonallopathic lesion of lumbosacral region 05/07/2012  . 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 Left hip pain 01/10/2012  . Rotator cuff syndrome of right shoulder 11/09/2011  . Bicipital tendinitis 11/09/2011  . Parkinson's disease ([place]) 11/09/2011  . Hypertension 11/09/2011    Natisha Trzcinski 09/21/2016, 11:45 AM  [facility] [address] [place], [place], [address] Phone: [phone]   Fax:  [phone]  Name: Eric Pope MRN: [id number] Date of Birth: [birth year]

## 2016-09-21 NOTE — Therapy (Signed)
[facility] [address] [place], [place], [address] Phone: [phone]   Fax:  [phone]  Speech Language Pathology Treatment  Patient Details  Name: Eric Pope MRN: [id number] Date of Birth: Sep 04, 1940 Referring Provider: Dr. Alysia Penna  Encounter Date: 09/21/2016      End of Session - 09/21/16 1158    Visit Number 12   Number of Visits 17   Date for SLP Re-Evaluation 09/22/16   SLP Start Time 1101   SLP Stop Time  1144   SLP Time Calculation (min) 43 min   Activity Tolerance Patient tolerated treatment well      Past Medical History:  Diagnosis Date  . Chronic left shoulder pain   . Hypertension   . Kidney stones   . Lead poisoning   . Nephrolithiasis   . 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 Vision abnormalities     Past Surgical History:  Procedure Laterality Date  . CHOLECYSTECTOMY    . LITHOTRIPSY      There were no vitals filed for this visit.      Subjective Assessment - 09/21/16 1152    Subjective "I didn't realize I had cognitive issues with the Parkinson's before the stroke - I've made some bad decisions"               ADULT SLP TREATMENT - 09/21/16 1114      General Information   Behavior/Cognition Cooperative     Treatment Provided   Treatment provided Cognitive-Linquistic     Pain Assessment   Pain Assessment No/denies pain     Cognitive-Linquistic Treatment   Treatment focused on Cognition;Patient/family/caregiver education   Skilled Treatment Facilitated selective attention simple card sort with 3 rules - pt required occasional extended time and rare min A to attend to task for 15 minutes. He ID'd errors with usual min cue of silence and not providing another group of cards, pt eventually realized and corrected. Pt required usual min questioning cues to verbalize "focus/attention" and memory as cognitive impairments. He required mod A, written and  explanation to verbalize/expalin awareness deficit.      Assessment / Recommendations / Plan   Plan Continue with current plan of care          SLP Education - 09/21/16 1157    Education provided Yes   Education Details areas of cognitive impairment, do cognitive activities at home   Person(s) Educated Patient   Methods Explanation;Demonstration;Verbal cues;Handout   Comprehension Verbal cues required;Need further instruction;Verbalized understanding          SLP Short Term Goals - 09/21/16 1158      SLP SHORT TERM GOAL #1   Title Pt will verbalize 3 cognitive impairment with occasional min A over 3 sessions.    Status Not Met     SLP SHORT TERM GOAL #2   Title Pt will solve simple functional reasoning, organization and attention to detail problems with 85% accuracy and occasional min A   Status Not Met     SLP SHORT TERM GOAL #3   Title Pt will demo selective attention for mod complex task for 5 minutes in quiet environment with rare min A back to task   Status Not Met          SLP Long Term Goals - 09/21/16 1158      SLP LONG TERM GOAL #1   Title Pt will ID errors on cognitive linguistic tasks with occasional min  A   Time 2   Period Weeks   Status On-going     SLP LONG TERM GOAL #2   Title Pt will solve simple math, reasoning and organization tasks with 80% accuracy and occasional min A   Time 2   Period Weeks   Status Revised     SLP LONG TERM GOAL #3   Title Pt will attend to 8 minute simple tasks with occasional min A, resulting in at least 90% success   Time 2   Period Weeks   Status Revised     SLP LONG TERM GOAL #4   Title Pt will verbalize 3 cognitive impairment with occasional min A over 3 sessions.    Baseline 09/08/16   Time 2   Period Weeks   Status On-going     SLP LONG TERM GOAL #5   Title Pt will solve simple functional reasoning, organization and attention to detail problems with 85% accuracy and occasional min A   Time 2   Period  Weeks   Status On-going     SLP LONG TERM GOAL #6   Title Pt will demo selective attention for mod complex task for 5 minutes in quiet environment with rare min A back to task   Time 2   Period Weeks   Status On-going     SLP LONG TERM GOAL #7   Title Pt will demo selective attention for mod complex task for 5 minutes in quiet environment with rare min A back to task   Time 2   Period Weeks   Status On-going          Plan - 09/21/16 1157    Clinical Impression Statement Pt continues to require verbal cues to maintain attention to task due to decr'd attention and impulsivity. SLP continues to believe pt would be best served by 24/7 supervision. Continued ST intervention is recommended, targeting cognitive linguistic skills for safety and independence, as well as reduced caregiver burden.      Patient will benefit from skilled therapeutic intervention in order to improve the following deficits and impairments:   Cognitive communication deficit    Problem List Patient Active Problem List   Diagnosis Date Noted  . 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 Gait disturbance, post-stroke 08/04/2016  . Hypertensive crisis   . PAF (paroxysmal atrial fibrillation) ([place])   . Right middle cerebral artery stroke ([place]) 07/14/2016  . 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 Paroxysmal atrial fibrillation (HCC)   . 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 Acute ischemic stroke ([place])   . CVA (cerebral vascular accident) ([place]) 07/10/2016  . Nonallopathic lesion of sacroiliac region 01/09/2013  . Greater trochanteric bursitis of left hip 07/09/2012  . Nonallopathic lesion of lumbosacral region 05/07/2012  . 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 Left hip pain 01/10/2012  . Rotator cuff syndrome of right shoulder 11/09/2011  . Bicipital tendinitis 11/09/2011  . Parkinson's disease ([place]) 11/09/2011  . Hypertension 11/09/2011    Findley Blankenbaker, Annye Rusk MS,  CCC-SLP 09/21/2016, 11:59 AM  [facility] [address] [place], [place], [address] Phone: [phone]   Fax:  [phone]   Name: Eric Pope MRN: [id number] Date of Birth: 01-25-1941

## 2016-09-21 NOTE — Telephone Encounter (Signed)
Follow Up:   Please call,he said was given the wrong container for his 24hrs Catecholamine test.

## 2016-09-21 NOTE — Patient Instructions (Signed)
Braiding    Move to side: 1) cross right leg in front of left, 2) bring back leg out to side, then 3) cross right leg behind left, 4) bring left leg out to side. Continue sequence in same direction. Reverse sequence, moving in opposite direction. Repeat sequence ___5_ times per session. Do _2___ sessions per day.   Copyright  VHI. All rights reserved.

## 2016-09-21 NOTE — Telephone Encounter (Signed)
I left a message for the patient to call.   Katrina in the lab is aware of what if going on with this patient.

## 2016-09-22 NOTE — Therapy (Signed)
[facility] [address] [place], [place], [address] Phone: [phone]   Fax:  [phone]  Physical Therapy Treatment  Patient Details  Name: Eric Pope MRN: [id number] Date of Birth: April 11, 1941 Referring Provider: Dr. Alysia Penna  Encounter Date: 09/21/2016      PT End of Session - 09/22/16 1150    Visit Number 12  G6   Number of Visits 14   Date for PT Re-Evaluation 09/22/16  extend til 09-29-16 due to missed week of appts   Authorization Time Period 08/25/16 to 10/24/16   PT Start Time 1020   PT Stop Time 1102   PT Time Calculation (min) 42 min      Past Medical History:  Diagnosis Date  . Chronic left shoulder pain   . Hypertension   . Kidney stones   . Lead poisoning   . Nephrolithiasis   . 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 Vision abnormalities     Past Surgical History:  Procedure Laterality Date  . CHOLECYSTECTOMY    . LITHOTRIPSY      There were no vitals filed for this visit.      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 No/denies        NeuroRe-ed;  Tandem gait inside // bars with UE support prn SLS inside bars - 10 sec hold with UE support prn  Pt performed sidestepping, crossovers front, stepping behind and then combined for braiding inside bars with UE support Prn:  Pt required verbal and demonstrational cues for correct sequence with braiding  Pt amb. Around gym with questions asked to identify certain objects to increase visual scanning, but with cues to decrease  Speed with ambulation to increase safety;  Pt noted to be very impulsive with mobility  Pt amb. 115' x 2 reps reading cards for increased  scanning and to increase multi-tasking with gait; no LOB occcured with this activity                         PT Education - 09/22/16 1149    Education provided Yes   Education Details braiding exercise added to HEP   Person(s) Educated Patient   Methods Explanation;Demonstration;Handout   Comprehension Verbalized understanding;Returned demonstration          PT Short Term Goals - 07/25/16 2228      PT SHORT TERM GOAL #1   Title see LTGs           PT Long Term Goals - 09/19/16 1352      PT LONG TERM GOAL #1   Title Demonstrate independence in HEP. (TARGET 09/08/16) New target for all LTG's 09/22/16   Time 4   Period Weeks   Status On-going     PT LONG TERM GOAL #2   Title Demonstrate decreased fall risk and improved gait/balance with increase FGA score to >=22/30;    4/20 21/30   Time 4   Period Weeks   Status On-going     PT LONG TERM GOAL #3   Title Patient able to walk independently in moderately distracting environment while attending to Left visual field to avoid running into/tripping over obstacles.  Baseline MET 09/19/2016    Time 4   Period Weeks   Status Achieved     PT LONG TERM GOAL #4   Title Patient to demonstrate 10% improvement in distance in 6 MWT with vital signs stable. (09/19/2016: 1681 feet (>10% increase from baseline), but vitals unstable BP: 164/94; HR: 58bpm; O2 sat: 97%)   Baseline --   Time 4   Period Weeks   Status On-going               Plan - 09/22/16 1500    Clinical Impression Statement Pt continues to demonstrate significant impulsivity with little carryover achieved on decreasing speed with movements as pt turned very quickly at end of sessionto amb. toward mat table and lost balance, requiring min assist for recovery of LOB:  constant cues were given throughout sessin to slow down with mobility in order to increase safety.    Rehab Potential Good   Clinical Impairments Affecting Rehab Potential decr  cognition and awareness of deficits   PT Frequency 2x / week   PT Duration 4 weeks   PT Treatment/Interventions ADLs/Self Care Home Management;Gait training;Stair training;Functional mobility training;Therapeutic activities;Therapeutic exercise;Balance training;Neuromuscular re-education;Cognitive remediation;Patient/family education;Visual/perceptual remediation/compensation   PT Next Visit Plan cont balance and gait - LTG's not due until next week (5-11) due to lack of attendance   Consulted and Agree with Plan of Care Patient;Family member/caregiver      Patient will benefit from skilled therapeutic intervention in order to improve the following deficits and impairments:  Abnormal gait, Decreased balance, Decreased cognition, Decreased safety awareness, Decreased strength, Impaired vision/preception, Postural dysfunction  Visit Diagnosis: Other abnormalities of gait and mobility     Problem List Patient Active Problem List   Diagnosis Date Noted  . 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 Gait disturbance, post-stroke 08/04/2016  . Hypertensive crisis   . PAF (paroxysmal atrial fibrillation) ([place])   . 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 Paroxysmal atrial fibrillation (HCC)   . 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 Acute ischemic stroke ([facility])   . CVA (cerebral vascular accident) ([facility]) 07/10/2016  . Nonallopathic lesion of sacroiliac region 01/09/2013  . Greater trochanteric bursitis of left hip 07/09/2012  . Nonallopathic lesion of lumbosacral region 05/07/2012  . 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 Left hip pain 01/10/2012  . Rotator cuff syndrome of right shoulder 11/09/2011  . Bicipital tendinitis 11/09/2011  . Parkinson's disease ([facility]) 11/09/2011  . Hypertension 11/09/2011    Alda Lea, [place] 09/22/2016, 3:07 PM  [facility] [address] [place], [place], [address] Phone: [phone]   Fax:  [phone]  Name: Eric Pope MRN: [id number] Date of Birth: 1941/02/16

## 2016-09-26 ENCOUNTER — Ambulatory Visit: Payer: Medicare Other | Admitting: Speech Pathology

## 2016-09-26 ENCOUNTER — Ambulatory Visit: Payer: Medicare Other | Admitting: Occupational Therapy

## 2016-09-26 ENCOUNTER — Ambulatory Visit: Payer: Medicare Other | Admitting: Physical Therapy

## 2016-09-26 DIAGNOSIS — R41841 Cognitive communication deficit: Secondary | ICD-10-CM

## 2016-09-26 DIAGNOSIS — R2689 Other abnormalities of gait and mobility: Secondary | ICD-10-CM

## 2016-09-26 DIAGNOSIS — I69354 Hemiplegia and hemiparesis following cerebral infarction affecting left non-dominant side: Secondary | ICD-10-CM

## 2016-09-26 DIAGNOSIS — I69318 Other symptoms and signs involving cognitive functions following cerebral infarction: Secondary | ICD-10-CM

## 2016-09-26 DIAGNOSIS — H53462 Homonymous bilateral field defects, left side: Secondary | ICD-10-CM

## 2016-09-26 DIAGNOSIS — R278 Other lack of coordination: Secondary | ICD-10-CM

## 2016-09-26 DIAGNOSIS — R41842 Visuospatial deficit: Secondary | ICD-10-CM

## 2016-09-26 DIAGNOSIS — M6281 Muscle weakness (generalized): Secondary | ICD-10-CM

## 2016-09-26 NOTE — Patient Instructions (Signed)
  Upper Extremity: [employer] on back holding ball on chest (elbows by your side). Push ball up to straighten elbows. Repeat 10 times per set.  Do 2 sets per session.     Sit, Hold a ball with arms straight. Slowly move arms up as far as you can without pain, keeping elbows straight. Repeat  10 times per set. Do 2 sets per session.  Do not hyperextend back.  Sit, then touch ball to top of left leg, move at a diagonal over to right side with elbows straight and while turning head/body to the right (look at ball).  10x, then repeat to the other side.   Sit-Up (Over Head)    Hold ball with both hands on top of legs. Keep elbows straight and slowly raise arms as far as you can without pain and keeping elbows straight.  (Do not lift head). Repeat 10 times.  Do 10 sets per session.

## 2016-09-26 NOTE — Therapy (Signed)
[facility] [address] [place], [place], [address] Phone: [phone]   Fax:  [phone]  Physical Therapy Treatment  Patient Details  Name: Eric Pope MRN: [id number] Date of Birth: October 30, 1940 Referring Provider: Dr. Alysia Penna  Encounter Date: 09/26/2016      PT End of Session - 09/27/16 1946    Visit Number 64  G7   Number of Visits 14   Date for PT Re-Evaluation 09/22/16   Authorization Time Period 08/25/16 to 10/24/16   PT Start Time 1017   PT Stop Time 1100   PT Time Calculation (min) 43 min   Equipment Utilized During Treatment Gait belt      Past Medical History:  Diagnosis Date  . Chronic left shoulder pain   . Hypertension   . Kidney stones   . Lead poisoning   . Nephrolithiasis   . 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 Vision abnormalities     Past Surgical History:  Procedure Laterality Date  . CHOLECYSTECTOMY    . LITHOTRIPSY      There were no vitals filed for this visit.      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 Yes   Pain Score 4    Pain Location Shoulder   Pain Orientation Right   Pain Descriptors / Indicators Dull;Aching   Pain Type Chronic pain   Pain Onset More than a month ago   Pain Frequency Several days a week                         [facility] Adult PT Treatment/Exercise - 09/27/16 0001      Ambulation/Gait   Ambulation/Gait Yes   Ambulation/Gait Assistance 5: Supervision   Ambulation/Gait Assistance Details cues to attend to objects on both sides  of room   Ambulation Distance (Feet) 250 Feet   Assistive device None   Gait Pattern Step-through pattern;Decreased arm swing - right;Decreased step length - right;Decreased step length - left;Right flexed knee in stance;Left flexed knee in stance;Decreased trunk rotation   Ambulation Surface Level;Indoor     High Level Balance   High Level Balance Activities Marching forwards;Marching backwards;Tandem walking  tandem/heel/toe walking fwd/bwd   High Level Balance Comments on floor with CGA             Balance Exercises - 09/27/16 1944      Balance Exercises: Standing   Standing Eyes Opened Narrow base of support (BOS);Foam/compliant surface;Head turns;30 secs   Rockerboard Anterior/posterior;EO;30 seconds   Tandem Gait Forward;2 reps;Intermittent upper extremity support   Turning Both;5 reps   Other Standing Exercises Braiding inside // bars 4 reps with SBA and cues for sequence             PT Short Term Goals - 07/25/16 2228      PT SHORT TERM GOAL #1   Title see LTGs           PT Long Term Goals - 09/19/16 1352      PT LONG TERM  GOAL #1   Title Demonstrate independence in HEP. (TARGET 09/08/16) New target for all LTG's 09/22/16   Time 4   Period Weeks   Status On-going     PT LONG TERM GOAL #2   Title Demonstrate decreased fall risk and improved gait/balance with increase FGA score to >=22/30;    4/20 21/30   Time 4   Period Weeks   Status On-going     PT LONG TERM GOAL #3   Title Patient able to walk independently in moderately distracting environment while attending to Left visual field to avoid running into/tripping over obstacles.   Baseline MET 09/19/2016    Time 4   Period Weeks   Status Achieved     PT LONG TERM GOAL #4   Title Patient to demonstrate 10% improvement in distance in 6 MWT with vital signs stable. (09/19/2016: 1681 feet (>10% increase from baseline), but vitals unstable BP: 164/94; HR: 58bpm; O2 sat: 97%)   Baseline --   Time 4    Period Weeks   Status On-going               Plan - 09/27/16 1947    Clinical Impression Statement Pt continues to demonstrate impulsivity with amb. and movements but no LOB occurred today during session; pt progressing towards goals   Rehab Potential Good   Clinical Impairments Affecting Rehab Potential decr cognition and awareness of deficits   PT Frequency 2x / week   PT Duration 4 weeks   PT Treatment/Interventions ADLs/Self Care Home Management;Gait training;Stair training;Functional mobility training;Therapeutic activities;Therapeutic exercise;Balance training;Neuromuscular re-education;Cognitive remediation;Patient/family education;Visual/perceptual remediation/compensation   PT Next Visit Plan check LTG's - renew vs. D/C   Consulted and Agree with Plan of Care Patient;Family member/caregiver      Patient will benefit from skilled therapeutic intervention in order to improve the following deficits and impairments:  Abnormal gait, Decreased balance, Decreased cognition, Decreased safety awareness, Decreased strength, Impaired vision/preception, Postural dysfunction  Visit Diagnosis: Other abnormalities of gait and mobility     Problem List Patient Active Problem List   Diagnosis Date Noted  . 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 Gait disturbance, post-stroke 08/04/2016  . Hypertensive crisis   . PAF (paroxysmal atrial fibrillation) ([facility])   . 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 Paroxysmal atrial fibrillation (HCC)   . 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 Acute ischemic stroke ([facility])   . CVA (cerebral vascular accident) ([facility]) 07/10/2016  . Nonallopathic lesion of sacroiliac region 01/09/2013  . Greater trochanteric bursitis of left hip 07/09/2012  . Nonallopathic lesion of lumbosacral region 05/07/2012  . 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 Left hip pain 01/10/2012  .  Rotator cuff syndrome of right shoulder 11/09/2011  . Bicipital tendinitis 11/09/2011  . Parkinson's disease ([facility]) 11/09/2011  . Hypertension 11/09/2011    Alda Lea, PT 09/27/2016, 7:52 PM  [facility] [address] [place], [place], [address] Phone: [phone]   Fax:  [phone]  Name: Eric Pope MRN: [id number] Date of Birth: [birth year]

## 2016-09-26 NOTE — Therapy (Signed)
[facility] [address] [place], [place], [address] Phone: [phone]   Fax:  [phone]  Occupational Therapy Treatment  Patient Details  Name: Eric Pope MRN: [id number] Date of Birth: 06-18-1940 Referring Provider: Dr Alysia Penna  Encounter Date: 09/26/2016      OT End of Session - 09/26/16 1117    Visit Number 13   Number of Visits 19   Date for OT Re-Evaluation 09/30/16   Authorization Type BC/BS Medicare G Code    Authorization Time Period 08/02/16-09/30/16   Authorization - Visit Number 35   Authorization - Number of Visits 19   OT Start Time 1106   OT Stop Time 1145   OT Time Calculation (min) 39 min   Activity Tolerance Patient tolerated treatment well   Behavior During Therapy Impulsive      Past Medical History:  Diagnosis Date  . Chronic left shoulder pain   . Hypertension   . Kidney stones   . Lead poisoning   . Nephrolithiasis   . 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 Vision abnormalities     Past Surgical History:  Procedure Laterality Date  . CHOLECYSTECTOMY    . LITHOTRIPSY      There were no vitals filed for this visit.      Subjective Assessment - 09/26/16 1114    Subjective  Pt reports that he is going to an a-fib clinic and is being worked up for incr sweating.   Patient Stated Goals Pt goals = Improved cognition, visual perception.   Currently in Pain? Yes   Pain Score 4    Pain Location Shoulder   Pain Orientation Right   Pain Descriptors / Indicators Dull   Pain Type Chronic pain   Pain Onset More than a month ago   Aggravating Factors  shoulder movement   Pain Relieving Factors rest       Began checking goals (shoulder ROM and 9-hole peg test) and discussed progress--see below.   Simple tabletop visual scanning (1.21M number cancellation) with only 1 omitted item, but this was after pt re-checked performance himself x2 and was able to find multiple  initial omissions.  Pt needed significantly incr time to perform and re-check.   Attempted to check BP with manual cuff, but unable to hear today.  Pt reports that he has had a-fib/irregular heart rate since CVA and he is going to a-fib clinic.  Decr sweating noted by end of session.  Pt tolerated treatment well.                   OT Education - 09/26/16 1257    Education Details Updated ball HEP--see pt instructions   Person(s) Educated Patient   Methods Explanation;Demonstration;Verbal cues;Handout   Comprehension Verbalized understanding;Returned demonstration;Verbal cues required;Need further instruction          OT Short Term Goals - 09/21/16 0955      OT SHORT TERM GOAL #1   Title Pt will be mod I basic simulated ADL (dressing) all aspects w/o vc's --check STGs 09/05/16   Time 4   Period Weeks   Status Achieved     OT SHORT TERM GOAL #2   Title Pt will be Mod I memory strategies and implement during OT sessions   Time 4   Period Weeks   Status Partially Met  Pt verbalizes understanding     OT SHORT TERM GOAL #3   Title Pt will be Mod  I simple snack/meal prep adhering to safety and attending to left side with min v.c.   Time 4   Period Weeks   Status Partially Met     OT SHORT TERM GOAL #4   Title Pt will perform mod complex tabletop scanning activities with  95% or better accuracy.   Time 4   Period Weeks   Status On-going     OT SHORT TERM GOAL #5   Title Pt will verbalize understanding of compensatory strategies for visual impairment.   Time 4   Period Weeks   Status On-going     OT SHORT TERM GOAL #6   Title i with initial HEP.   Time 4   Status On-going           OT Long Term Goals - 09/26/16 1139      OT LONG TERM GOAL #1   Title Pt will navigate a busy environment and perform environmental scanning with 90% or better accuracy.   Time 8   Period Weeks   Status Revised     OT LONG TERM GOAL #2   Title Pt will be Mod I higher  level cognitive tasks related to finances (bill pay and balancing a check book).   Time 8   Period Weeks   Status New     OT LONG TERM GOAL #3   Title Pt will demonstrate improved coordination on bilaterally  as seen by improved 9 Hold Peg score by 10 seconds in prep for functional use.   Baseline 07/25/16 = Left 49.03 seconds affected side (R = 108.84 seconds)    Time 8   Period Weeks   Status Achieved  09/26/16:  R-57.93sec, L-38.97sec     OT LONG TERM GOAL #4   Title Pt will be Mod I simple meal prep (fry and egg, make coffee) on stovetop while maintaining safety    Time 8   Period Weeks   Status On-going     OT LONG TERM GOAL #5   Title Pt will demonstrate ability to retrieve a lightweight object at 85* shoulder flexion with -10 elbow extension with his RUE.   Time 8   Period Weeks   Status Achieved  09/26/16:  shoulder flex 105*, elbow ext -30* (improved elbow ext at lower ROM)     OT LONG TERM GOAL #6   Title Pt will demonstrate ability to perform a physical and cognitive task simultaneously with 85% or better accuracy.   Time 8   Period Weeks   Status New               Plan - 09/26/16 1127    Clinical Impression Statement Pt is slowly progressing towards goals, but is limited by cognitive and visual perceptual deficits.  Pt demo improvement in R shoulder pain and ROM and improved coordination.   Rehab Potential Fair   Clinical Impairments Affecting Rehab Potential has hired caregiver 10 hrs per week   OT Frequency 2x / week   OT Duration 8 weeks   OT Treatment/Interventions Self-care/ADL training;Therapeutic exercise;Patient/family education;Neuromuscular education;Balance training;Therapeutic exercises;DME and/or AE instruction;Therapeutic activities;Cognitive remediation/compensation;Visual/perceptual remediation/compensation   Plan check remaining goals and renew   OT Home Exercise Plan PWR! moves in supine, shoulder flex with ball in sitting 08/28/16   Consulted  and Agree with Plan of Care Patient      Patient will benefit from skilled therapeutic intervention in order to improve the following deficits and impairments:  Decreased endurance, Impaired vision/preception, Decreased  knowledge of precautions, Impaired perceived functional ability, Decreased activity tolerance, Decreased knowledge of use of DME, Decreased strength, Impaired sensation, Decreased balance, Decreased cognition, Decreased range of motion, Decreased coordination, Decreased safety awareness, Impaired UE functional use  Visit Diagnosis: Hemiplegia and hemiparesis following cerebral infarction affecting left non-dominant side (HCC)  Visuospatial deficit  Muscle weakness (generalized)  Other lack of coordination  Other abnormalities of gait and mobility  Homonymous bilateral field defects, left side  Other symptoms and signs involving cognitive functions following cerebral infarction    Problem List Patient Active Problem List   Diagnosis Date Noted  . 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 Gait disturbance, post-stroke 08/04/2016  . Hypertensive crisis   . PAF (paroxysmal atrial fibrillation) ([facility] [facility])   . 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 Paroxysmal atrial fibrillation (H[facility])   . 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 Acute ischemic stroke ([place])   . CVA (cerebral vascular accident) ([place]) 07/10/2016  . Nonallopathic lesion of sacroiliac region 01/09/2013  . Greater trochanteric bursitis of left hip 07/09/2012  . Nonallopathic lesion of lumbosacral region 05/07/2012  . 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 Left hip pain 01/10/2012  . Rotator cuff syndrome of right shoulder 11/09/2011  . Bicipital tendinitis 11/09/2011  . Parkinson's disease ([place] [facility]) 11/09/2011  . Hypertension 11/09/2011    [facility] 09/26/2016, 1:07 PM  [facility] [address] [place], [place], [address] Phone: [phone]   Fax:  [phone]  Name: Eric Pope MRN: [id number] Date of Birth: 1940/09/16   Vianne Bulls, OTR/L [facility] [address] [place], [place]  [address] [phone] phone [phone] 09/26/16 1:07 PM

## 2016-09-26 NOTE — Therapy (Signed)
[facility] [address] [place], [place], [address] Phone: [phone]   Fax:  [phone]  Speech Language Pathology Treatment  Patient Details  Name: Eric Pope MRN: [id number] Date of Birth: 12-14-1940 Referring Provider: Dr. Alysia Penna  Encounter Date: 09/26/2016      End of Session - 09/26/16 1232    SLP Start Time 0932   SLP Stop Time  1012   SLP Time Calculation (min) 40 min      Past Medical History:  Diagnosis Date  . Chronic left shoulder pain   . Hypertension   . Kidney stones   . Lead poisoning   . Nephrolithiasis   . 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 Vision abnormalities     Past Surgical History:  Procedure Laterality Date  . CHOLECYSTECTOMY    . LITHOTRIPSY      There were no vitals filed for this visit.      Subjective Assessment - 09/26/16 0936    Subjective "Do you think the Parkinson's had me make bad decisions"   Currently in Pain? No/denies               ADULT SLP TREATMENT - 09/26/16 0936      General Information   Behavior/Cognition Cooperative     Treatment Provided   Treatment provided Cognitive-Linquistic     Pain Assessment   Pain Assessment No/denies pain     Cognitive-Linquistic Treatment   Treatment focused on Cognition;Patient/family/caregiver education   Skilled Treatment Facilitated simple functional money word problems and attention to detail reading menus, charts to solve problems with ongoing cues to attend to and keep track of mental math - and attend to details of questions. Once he had the problem organized, simple math is intact. Alternating attention to calculator he required frequent cues to ID numbers he put in in error. Internal distractions prevelent re: prior significant other.      Assessment / Recommendations / Plan   Plan Continue with current plan of care     Progression Toward Goals   Progression toward goals Not  progressing toward goals (comment)  impulsive, internal distractions,            SLP Short Term Goals - 09/26/16 1229      SLP SHORT TERM GOAL #1   Title Pt will verbalize 3 cognitive impairment with occasional min A over 3 sessions.    Status Not Met     SLP SHORT TERM GOAL #2   Title Pt will solve simple functional reasoning, organization and attention to detail problems with 85% accuracy and occasional min A   Status Not Met     SLP SHORT TERM GOAL #3   Title Pt will demo selective attention for mod complex task for 5 minutes in quiet environment with rare min A back to task   Status Not Met          SLP Long Term Goals - 09/26/16 1229      SLP LONG TERM GOAL #1   Title Pt will ID errors on cognitive linguistic tasks with occasional min A   Time 2   Period Weeks   Status On-going     SLP LONG TERM GOAL #2   Title Pt will solve simple math, reasoning and organization tasks with 80% accuracy and occasional min A   Time 2   Period Weeks   Status Revised     SLP LONG TERM GOAL #3  Title Pt will attend to 8 minute simple tasks with occasional min A, resulting in at least 90% success   Time 2   Period Weeks   Status Revised     SLP LONG TERM GOAL #4   Title Pt will verbalize 3 cognitive impairment with occasional min A over 3 sessions.    Baseline 09/08/16; 09/26/16   Time 2   Period Weeks   Status On-going     SLP LONG TERM GOAL #5   Title Pt will solve simple functional reasoning, organization and attention to detail problems with 85% accuracy and occasional min A   Time 2   Period Weeks   Status On-going     SLP LONG TERM GOAL #6   Title Pt will demo selective attention for mod complex task for 5 minutes in quiet environment with rare min A back to task   Time 2   Period Weeks   Status On-going     SLP LONG TERM GOAL #7   Title Pt will demo selective attention for mod complex task for 5 minutes in quiet environment with rare min A back to task   Time  2   Period Weeks   Status On-going          Plan - 09/26/16 1229    Clinical Impression Statement Pt continues to require verbal cues to maintain attention to task due to decr'd attention and impulsivity. SLP continues to believe pt would be best served by 24/7 supervision. Continued ST intervention is recommended, targeting cognitive linguistic skills for safety and independence, as well as reduced caregiver burden.   Speech Therapy Frequency 2x / week   Treatment/Interventions Compensatory strategies;Functional tasks;Cognitive reorganization;Internal/external aids;SLP instruction and feedback;Patient/family education   Potential to Achieve Goals Fair   Potential Considerations Ability to learn/carryover information;Family/community support;Previous level of function;Cooperation/participation level;Severity of impairments   Consulted and Agree with Plan of Care Patient      Patient will benefit from skilled therapeutic intervention in order to improve the following deficits and impairments:   Cognitive communication deficit    Problem List Patient Active Problem List   Diagnosis Date Noted  . 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 Gait disturbance, post-stroke 08/04/2016  . Hypertensive crisis   . PAF (paroxysmal atrial fibrillation) ([facility])   . 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 Paroxysmal atrial fibrillation (H[facility])   . 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 Acute ischemic stroke ([facility])   . CVA (cerebral vascular accident) ([facility]) 07/10/2016  . Nonallopathic lesion of sacroiliac region 01/09/2013  . Greater trochanteric bursitis of left hip 07/09/2012  . Nonallopathic lesion of lumbosacral region 05/07/2012  . 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 Left hip pain 01/10/2012  . Rotator cuff syndrome of right shoulder 11/09/2011  . Bicipital tendinitis 11/09/2011  . Parkinson's disease ([facility])  11/09/2011  . Hypertension 11/09/2011    [place], [place], CCC-SLP 09/26/2016, 12:32 PM  [facility] [address] [place], [place], [address] Phone: [phone]   Fax:  [phone]   Name: JAEVON PARAS MRN: [id number] Date of Birth: [birth year]

## 2016-09-28 ENCOUNTER — Ambulatory Visit: Payer: Medicare Other | Admitting: Speech Pathology

## 2016-09-28 ENCOUNTER — Ambulatory Visit: Payer: Medicare Other | Admitting: Occupational Therapy

## 2016-09-28 ENCOUNTER — Encounter: Payer: Self-pay | Admitting: Physical Therapy

## 2016-09-28 ENCOUNTER — Ambulatory Visit: Payer: Medicare Other | Admitting: Physical Therapy

## 2016-09-28 VITALS — BP 138/88 | HR 144

## 2016-09-28 DIAGNOSIS — R41841 Cognitive communication deficit: Secondary | ICD-10-CM | POA: Diagnosis not present

## 2016-09-28 DIAGNOSIS — I69354 Hemiplegia and hemiparesis following cerebral infarction affecting left non-dominant side: Secondary | ICD-10-CM

## 2016-09-28 DIAGNOSIS — R41842 Visuospatial deficit: Secondary | ICD-10-CM

## 2016-09-28 DIAGNOSIS — R2689 Other abnormalities of gait and mobility: Secondary | ICD-10-CM

## 2016-09-28 DIAGNOSIS — R278 Other lack of coordination: Secondary | ICD-10-CM

## 2016-09-28 DIAGNOSIS — M6281 Muscle weakness (generalized): Secondary | ICD-10-CM

## 2016-09-28 NOTE — Therapy (Signed)
[facility] [address] [place], [place], [address] Phone: [phone]   Fax:  [phone]  Speech Language Pathology Treatment  Patient Details  Name: Eric Pope MRN: [id number] Date of Birth: 08-27-1940 Referring Provider: Dr. Alysia Penna  Encounter Date: 09/28/2016      End of Session - 09/28/16 1209    Visit Number 14   Number of Visits 17   Date for SLP Re-Evaluation 10/06/16   SLP Start Time 0930   SLP Stop Time  3267   SLP Time Calculation (min) 44 min   Activity Tolerance Patient tolerated treatment well      Past Medical History:  Diagnosis Date  . Chronic left shoulder pain   . Hypertension   . Kidney stones   . Lead poisoning   . Nephrolithiasis   . 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 Vision abnormalities     Past Surgical History:  Procedure Laterality Date  . CHOLECYSTECTOMY    . LITHOTRIPSY      There were no vitals filed for this visit.      Subjective Assessment - 09/28/16 0939    Subjective "My pastor at [facility] is helping me look into [facility] to live"               ADULT SLP TREATMENT - 09/28/16 0942      General Information   Behavior/Cognition Cooperative     Treatment Provided   Treatment provided Cognitive-Linquistic     Pain Assessment   Pain Assessment 0-10   Pain Score 6    Pain Location right shoulder   Pain Descriptors / Indicators Aching   Pain Intervention(s) Monitored during session     Cognitive-Linquistic Treatment   Treatment focused on Cognition;Patient/family/caregiver education   Skilled Treatment Functional math problem solving with rare min A and 90% accuracy. Alternating attention between calculator and list of numbers with frequent min to mod A to ID errors entering numbers, with cues to attend to calculator. Pt sustained attention to moderately complex card sort with rare min A.     Assessment /  Recommendations / Plan   Plan Continue with current plan of care     Progression Toward Goals   Progression toward goals Progressing toward goals          SLP Education - 09/28/16 1207    Education provided Yes   Education Details cognitive impairments, reduced awareness   Person(s) Educated Patient   Methods Explanation;Demonstration;Verbal cues   Comprehension Verbalized understanding;Need further instruction;Verbal cues required          SLP Short Term Goals - 09/28/16 1209      SLP SHORT TERM GOAL #1   Title Pt will verbalize 3 cognitive impairment with occasional min A over 3 sessions.    Status Not Met     SLP SHORT TERM GOAL #2   Title Pt will solve simple functional reasoning, organization and attention to detail problems with 85% accuracy and occasional min A   Status Not Met     SLP SHORT TERM GOAL #3   Title Pt will demo selective attention for mod complex task for 5 minutes in quiet environment with rare min A back to task   Status Not Met          SLP Long Term Goals - 09/28/16 1209      SLP LONG TERM GOAL #1   Title Pt will ID errors on  cognitive linguistic tasks with occasional min A   Time 2   Period Weeks   Status On-going     SLP LONG TERM GOAL #2   Title Pt will solve simple math, reasoning and organization tasks with 80% accuracy and occasional min A   Time 2   Period Weeks   Status Revised     SLP LONG TERM GOAL #3   Title Pt will attend to 8 minute simple tasks with occasional min A, resulting in at least 90% success   Time 2   Period Weeks   Status Revised     SLP LONG TERM GOAL #4   Title Pt will verbalize 3 cognitive impairment with occasional min A over 3 sessions.    Baseline 09/08/16; 09/26/16   Time 2   Period Weeks   Status On-going     SLP LONG TERM GOAL #5   Title Pt will solve simple functional reasoning, organization and attention to detail problems with 85% accuracy and occasional min A   Time 2   Period Weeks    Status On-going     SLP LONG TERM GOAL #6   Title Pt will demo selective attention for mod complex task for 5 minutes in quiet environment with rare min A back to task   Time 2   Period Weeks   Status On-going     SLP LONG TERM GOAL #7   Title Pt will demo selective attention for mod complex task for 5 minutes in quiet environment with rare min A back to task   Time 2   Period Weeks   Status On-going          Plan - 09/28/16 1207    Clinical Impression Statement Pt with some improvement on selective attention and simple problem solving. Continues to require mod A for alternating attention and recall. Pt reports he and his pastor are looking into pt moving into [facility] continuing care community. Continue skilled ST to maximize cognition for safety, decision making and reduce caregiver burden.    Speech Therapy Frequency 2x / week   Treatment/Interventions Compensatory strategies;Functional tasks;Cognitive reorganization;Internal/external aids;SLP instruction and feedback;Patient/family education   Potential to Achieve Goals Fair   Potential Considerations Ability to learn/carryover information;Family/community support;Previous level of function;Cooperation/participation level;Severity of impairments   Consulted and Agree with Plan of Care Patient      Patient will benefit from skilled therapeutic intervention in order to improve the following deficits and impairments:   Cognitive communication deficit    Problem List Patient Active Problem List   Diagnosis Date Noted  . 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 Gait disturbance, post-stroke 08/04/2016  . Hypertensive crisis   . PAF (paroxysmal atrial fibrillation) ([facility])   . 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 Paroxysmal atrial fibrillation (HCC)   . 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 Acute ischemic stroke ([facility] [place])   . CVA (cerebral  vascular accident) ([facility]) 07/10/2016  . Nonallopathic lesion of sacroiliac region 01/09/2013  . Greater trochanteric bursitis of left hip 07/09/2012  . Nonallopathic lesion of lumbosacral region 05/07/2012  . 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 Left hip pain 01/10/2012  . Rotator cuff syndrome of right shoulder 11/09/2011  . Bicipital tendinitis 11/09/2011  . Parkinson's disease ([facility] [place]) 11/09/2011  . Hypertension 11/09/2011    Denisse Whitenack, Annye Rusk MS, [place] 09/28/2016, 12:10 PM  [facility] [address]  [place], [place], [address] Phone: [phone]   Fax:  [phone]   Name: Eric Pope MRN: [id number] Date of Birth: 1940/08/09

## 2016-09-28 NOTE — Progress Notes (Signed)
Agree with cardiology weighing in on his exercise tolerance, Dr Dahlia Client tends to push himself

## 2016-09-28 NOTE — Therapy (Addendum)
[facility] [address] [place], [place], [address] Phone: [phone]   Fax:  [phone]  Occupational Therapy Treatment  Patient Details  Name: Eric Pope MRN: [id number] Date of Birth: 31-Jul-1940 Referring Provider: Dr Alysia Penna  Encounter Date: 09/28/2016      OT End of Session - 09/28/16 1312    Visit Number 14   Number of Visits 19   Date for OT Re-Evaluation 09/30/16   Authorization Type BC/BS Medicare G Code    Authorization Time Period 08/02/16-09/30/16   Authorization - Visit Number 53   Authorization - Number of Visits 19   OT Start Time 0938  pt late   OT Stop Time 0930   OT Time Calculation (min) 35 min   Activity Tolerance Patient tolerated treatment well   Behavior During Therapy Impulsive      Past Medical History:  Diagnosis Date  . Chronic left shoulder pain   . Hypertension   . Kidney stones   . Lead poisoning   . Nephrolithiasis   . 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 Vision abnormalities     Past Surgical History:  Procedure Laterality Date  . CHOLECYSTECTOMY    . LITHOTRIPSY      There were no vitals filed for this visit.      Subjective Assessment - 09/28/16 0900    Patient Stated Goals Pt goals = Improved cognition, visual perception.   Currently in Pain? Yes   Pain Score 5    Pain Location Shoulder   Pain Orientation Right   Pain Descriptors / Indicators Aching   Pain Type Chronic pain   Pain Onset More than a month ago   Pain Frequency Several days a week   Aggravating Factors  shoulder movement   Pain Relieving Factors rest               Treatment: Tabletop scanning to complete a 24 piece puzzle, min-mod v.c. Required for organization. Basic environmental scanning with 100% accuracy, with no significant distractions. Pt continues to demonstrate difficulty with environmental scanning in a busier environment.                  OT Short Term Goals - 09/28/16 0901      OT SHORT TERM GOAL #1   Title Pt will be mod I basic simulated ADL (dressing) all aspects w/o vc's --check STGs 09/05/16   Time 4   Period Weeks   Status Achieved     OT SHORT TERM GOAL #2   Title Pt will be Mod I memory strategies and implement during OT sessions   Time 4   Period Weeks   Status Partially Met  Pt verbalizes understanding     OT SHORT TERM GOAL #3   Title Pt will be Mod I simple snack/meal prep adhering to safety and attending to left side with min v.c.   Time 4   Period Weeks   Status Partially Met     OT SHORT TERM GOAL #4   Title Pt will perform mod complex tabletop scanning activities with  95% or better accuracy.   Time 4   Period Weeks   Status On-going  not consistent, grossly 75-90%     OT SHORT TERM GOAL #5   Title Pt will verbalize understanding of compensatory strategies for visual impairment.   Time 4   Period Weeks   Status Achieved     OT SHORT TERM GOAL #  6   Title i with initial HEP.   Time 4   Status Achieved           OT Long Term Goals - 09/28/16 0904      OT LONG TERM GOAL #1   Title Pt will navigate a busy environment and perform environmental scanning with 90% or better accuracy.   Time 8   Period Weeks   Status Partially Met  met for basic scanning without significant distractions     OT LONG TERM GOAL #2   Title Pt will be Mod I higher level cognitive tasks related to finances (bill pay and balancing a check book).   Time 8   Period Weeks   Status Deferred  Pt's brother is handling finances, deferred to [place] #3   Title Pt will demonstrate improved coordination on bilaterally  as seen by improved 9 Hold Peg score by 10 seconds in prep for functional use.   Baseline 07/25/16 = Left 49.03 seconds affected side (R = 108.84 seconds)    Time 8   Period Weeks   Status Achieved  09/26/16:  R-57.93sec, L-38.97sec     OT LONG TERM GOAL #4   Title Pt will be Mod  I simple meal prep (fry and egg, make coffee) on stovetop while maintaining safety    Time 8   Period Weeks   Status Not Met  supervison recommended     OT LONG TERM GOAL #5   Title Pt will demonstrate ability to retrieve a lightweight object at 85* shoulder flexion with -10 elbow extension with his RUE.   Time 8   Period Weeks   Status Achieved  09/26/16:  shoulder flex 105*, elbow ext -30* (improved elbow ext at lower ROM)     OT LONG TERM GOAL #6   Title Pt will demonstrate ability to perform a physical and cognitive task simultaneously with 85% or better accuracy.   Time 8   Period Weeks   Status On-going  not met, pt is unable to multi task consistently               Plan - 09/28/16 1300    Clinical Impression Statement Pt is progressing towards goals for environmental scanning. Pt continues to demonstrate difficulty with tabletop scanning activities.   Rehab Potential Fair   Clinical Impairments Affecting Rehab Potential has hired caregiver 10 hrs per week   OT Frequency 2x / week   OT Duration 8 weeks   Plan renew next visit   OT Home Exercise Plan PWR! moves in supine, shoulder flex with ball in sitting 08/28/16   Consulted and Agree with Plan of Care Patient      Patient will benefit from skilled therapeutic intervention in order to improve the following deficits and impairments:  Decreased endurance, Impaired vision/preception, Decreased knowledge of precautions, Impaired perceived functional ability, Decreased activity tolerance, Decreased knowledge of use of DME, Decreased strength, Impaired sensation, Decreased balance, Decreased cognition, Decreased range of motion, Decreased coordination, Decreased safety awareness, Impaired UE functional use  Visit Diagnosis: Hemiplegia and hemiparesis following cerebral infarction affecting left non-dominant side (HCC)  Visuospatial deficit  Muscle weakness (generalized)  Other lack of coordination    Problem  List Patient Active Problem List   Diagnosis Date Noted  . 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 Gait disturbance, post-stroke 08/04/2016  . Hypertensive crisis   . PAF (paroxysmal atrial fibrillation) ([place])   .  Right middle cerebral artery stroke ([place]) 07/14/2016  . 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 Paroxysmal atrial fibrillation (HCC)   . 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 Acute ischemic stroke ([place])   . CVA (cerebral vascular accident) ([place]) 07/10/2016  . Nonallopathic lesion of sacroiliac region 01/09/2013  . Greater trochanteric bursitis of left hip 07/09/2012  . Nonallopathic lesion of lumbosacral region 05/07/2012  . 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 Left hip pain 01/10/2012  . Rotator cuff syndrome of right shoulder 11/09/2011  . Bicipital tendinitis 11/09/2011  . Parkinson's disease ([place]) 11/09/2011  . Hypertension 11/09/2011    Jersey Espinoza 09/28/2016, 5:10 PM  [facility] [address] [place], [place], [address] Phone: [phone]   Fax:  [phone]  Name: Eric Pope MRN: [id number] Date of Birth: August 23, 1940

## 2016-09-28 NOTE — Therapy (Signed)
[facility] [address] [place], [place], [address] Phone: [phone]   Fax:  [phone]  Physical Therapy Treatment and Discharge Summary  Patient Details  Name: Eric Pope MRN: [id number] Date of Birth: 1940/12/25 Referring Provider: Dr. Alysia Penna  Encounter Date: 09/28/2016      PT End of Session - 09/28/16 1641    Visit Number 56  Lindy   Number of Visits 14   Date for PT Re-Evaluation 09/22/16   Authorization Time Period 08/25/16 to 10/24/16   PT Start Time 1017   PT Stop Time 1102   PT Time Calculation (min) 45 min   Activity Tolerance Treatment limited secondary to medical complications (Comment)  incr HR with activity   Behavior During Therapy Impulsive      Past Medical History:  Diagnosis Date  . Chronic left shoulder pain   . Hypertension   . Kidney stones   . Lead poisoning   . Nephrolithiasis   . 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 Vision abnormalities     Past Surgical History:  Procedure Laterality Date  . CHOLECYSTECTOMY    . LITHOTRIPSY      Vitals:   09/28/16 1023 09/28/16 1035  BP: 128/84 138/88  Pulse: 90 (!) 144        Subjective Assessment - 09/28/16 1018    Subjective One of my medicines is making me sweat but I don't know which one. Reports he was in afib when he had his stroke, went out of afib, and now realizes he is back in afib. Denies chest pain.    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 No/denies   Pain Onset More than a month ago            [facility] PT Assessment - 09/28/16 0001      Functional Gait  Assessment   Gait assessed  Yes   Gait Level Surface Walks 20 ft in less than 5.5 sec, no assistive devices, good speed, no evidence for imbalance,  normal gait pattern, deviates no more than 6 in outside of the 12 in walkway width.  5.15   Change in Gait Speed Able to smoothly change walking speed without loss of balance or gait deviation. Deviate no more than 6 in outside of the 12 in walkway width.   Gait with Horizontal Head Turns Performs head turns smoothly with no change in gait. Deviates no more than 6 in outside 12 in walkway width   Gait with Vertical Head Turns Performs head turns with no change in gait. Deviates no more than 6 in outside 12 in walkway width.   Gait and Pivot Turn Pivot turns safely within 3 sec and stops quickly with no loss of balance.   Step Over Obstacle Is able to step over 2 stacked shoe boxes taped together (9 in total height) without changing gait speed. No evidence of imbalance.   Gait with Narrow Base of Support Ambulates less than 4 steps heel to toe or cannot perform without assistance.   Gait with Eyes Closed Walks 20 ft, slow speed, abnormal gait pattern, evidence for imbalance, deviates 10-15 in outside 12 in walkway width. Requires more than 9 sec to ambulate 20 ft.   Ambulating Backwards Walks 20  ft, slow speed, abnormal gait pattern, evidence for imbalance, deviates 10-15 in outside 12 in walkway width.   Steps Alternating feet, no rail.   Total Score 23                     [facility] Adult PT Treatment/Exercise - 09/28/16 1630      Transfers   Sit to Stand 6: Modified independent (Device/Increase time);Without upper extremity assist;From chair/3-in-1   Stand to Sit 6: Modified independent (Device/Increase time);Without upper extremity assist     Ambulation/Gait   Ambulation/Gait Assistance 7: Independent;5: Supervision   Ambulation/Gait Assistance Details on treadmill, pt with scuffing bil feet (poor foot clearance) and supervision; over ground scuffing not heard; obstacle course with stepping over and around objects, multiple turns through chairs and did not run into any object or lose  his balance   Ambulation Distance (Feet) 700 Feet   Assistive device None   Gait Pattern Step-through pattern;Decreased arm swing - right;Decreased arm swing - left;Decreased step length - right;Decreased step length - left;Poor foot clearance - left;Poor foot clearance - right   Ambulation Surface Level;Indoor     Exercises   Exercises Lumbar     Lumbar Exercises: Aerobic   Tread Mill initially 1.0 mph x 30 sec; incr to 1.8 mph with pt poorly remaining upright and advancing his feet; after 2 minutes, HR 144 and pt cooled down over 1 minute 1.8 mph down to 0.                 PT Education - 09/28/16 1636    Education provided Yes   Education Details Risk of CVA with return of afib and elevated HR to 140's with slow treadmill walk. Pt asymptomatic. Discussed need to get clearance from his cardiologist to return to working out in the gym. He states he has an appt later this month. Discussed ?stress test for closer monitoring of HR and BP. ?appropriate for referral to cardiac rehab for monitored exercise.   Person(s) Educated Patient   Methods Explanation   Comprehension Verbalized understanding;Need further instruction  despite 2 conversations re: plan to relay information to his MD, and for pt to see his MD, he still was unclear he was being discharged from PT and required a third conversation          PT Short Term Goals - 07/25/16 2228      PT SHORT TERM GOAL #1   Title see LTGs           PT Long Term Goals - 09/28/16 1646      PT LONG TERM GOAL #1   Title Demonstrate independence in HEP. (TARGET 09/08/16) New target for all LTG's 09/22/16; 09/28/16 ongoing   Time 4   Period Weeks   Status Partially Met     PT LONG TERM GOAL #2   Title Demonstrate decreased fall risk and improved gait/balance with increase FGA score to >=22/30;    4/20 21/30   09/28/16  23/30   Time 4   Period Weeks   Status Achieved     PT LONG TERM GOAL #3   Title Patient able to walk  independently in moderately distracting environment while attending to Left visual field to avoid running into/tripping over obstacles.   Baseline MET 09/19/2016    Time 4   Period Weeks   Status Achieved     PT LONG TERM GOAL #4   Title Patient to demonstrate 10% improvement in distance in  6 MWT with vital signs stable. (09/19/2016: 1681 feet (>10% increase from baseline), but vitals unstable BP: 164/94; HR: 58bpm; O2 sat: 97%)   Time 4   Period Weeks   Status Partially Met               Plan - 10/16/2016 1642    Clinical Impression Statement Patient's final visit with FGA repeated and pt has met 2 of 4 goals with other goals partially met.. During remainder of session, completed walking and intiated walking on treadmill as pt is anxious to return to working out at the gym. Patient's HR was unstable (90 up to 144 bpm) with slow velocity). Got patient off treadmill with frank discussion re: his risks and encouraged to follow-up with his MD re: ?need for stress test.    Rehab Potential Good   Clinical Impairments Affecting Rehab Potential decr cognition and awareness of deficits   PT Frequency 2x / week   PT Duration 4 weeks   PT Treatment/Interventions ADLs/Self Care Home Management;Gait training;Stair training;Functional mobility training;Therapeutic activities;Therapeutic exercise;Balance training;Neuromuscular re-education;Cognitive remediation;Patient/family education;Visual/perceptual remediation/compensation   PT Next Visit Plan check LTG's - renew vs. D/C   Consulted and Agree with Plan of Care Patient;Family member/caregiver      Patient will benefit from skilled therapeutic intervention in order to improve the following deficits and impairments:  Abnormal gait, Decreased balance, Decreased cognition, Decreased safety awareness, Decreased strength, Impaired vision/preception, Postural dysfunction  Visit Diagnosis: Hemiplegia and hemiparesis following cerebral infarction  affecting left non-dominant side (HCC)  Other abnormalities of gait and mobility       G-Codes - 10/16/2016 1649    Functional Assessment Tool Used (Outpatient Only) gait velocity 4.13 ft/sec   Functional Limitation Mobility: Walking and moving around   Mobility: Walking and Moving Around Goal Status [phone]) At least 1 percent but less than 20 percent impaired, limited or restricted   Mobility: Walking and Moving Around Discharge Status [phone]) At least 1 percent but less than 20 percent impaired, limited or restricted      Problem List Patient Active Problem List   Diagnosis Date Noted  . 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 Gait disturbance, post-stroke 08/04/2016  . Hypertensive crisis   . PAF (paroxysmal atrial fibrillation) ([place])   . Right middle cerebral artery stroke ([place]) 07/14/2016  . 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 Paroxysmal atrial fibrillation (HCC)   . 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 Acute ischemic stroke ([place])   . CVA (cerebral vascular accident) ([place]) 07/10/2016  . Nonallopathic lesion of sacroiliac region 01/09/2013  . Greater trochanteric bursitis of left hip 07/09/2012  . Nonallopathic lesion of lumbosacral region 05/07/2012  . 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 Left hip pain 01/10/2012  . Rotator cuff syndrome of right shoulder 11/09/2011  . Bicipital tendinitis 11/09/2011  . Parkinson's disease ([place]) 11/09/2011  . Hypertension 11/09/2011   PHYSICAL THERAPY DISCHARGE SUMMARY  Visits from Start of Care: 14  Current functional level related to goals / functional outcomes: Modified independent with walking (continued decr cognition impacting safety)   Remaining deficits: decr balance and cognitive deficits   Education / Equipment: HEP; need to follow-up with MD re: cardiac status relative to increased exercise  Plan: Patient agrees to discharge.  Patient goals were partially met. Patient is being  discharged due to a change in medical status.  ?????       Rexanne Mano, PT 16-Oct-2016, 5:04 PM  [facility] [address] [place], [place], [address] Phone: [phone]   Fax:  [phone]  Name: KIERRE HINTZ MRN: [id number] Date of Birth: 1940/10/03

## 2016-09-28 NOTE — Patient Instructions (Signed)
You may be eligible for cardiac rehabilitation at [facility] if you've recently experienced: Cardiomyopathy  Chest pain (stable angina)  Congestive heart failure  Coronary angioplasty  Coronary artery bypass surgery (CABG)  Heart attack  Heart transplant  Heart valve surgery  If you have Congestive Heart Failure, you may be eligible for one of these 3 rehab programs: ? Phase II Cardiac Rehab - Ejection Fraction (EF) is < 30%,  - 3 times/week  ? Pulmonary Rehab:             - EF is >/= 30%             - 2 times/week  ? Maintenance Program - Self-pay program [phone] per month) - Ideal for a patient who is able to safely get onto/off of exercise machines safely and independently  You will need a physician's referral to participate in cardiac or pulmonary rehab. For more information, speak with your doctor or contact the cardiac rehab location closest to you. [facility] ([place]. [facility])  [phone]   . [facility])  [phone]   . Linna Hoff [facility])  [phone]

## 2016-09-29 NOTE — Telephone Encounter (Signed)
Follow Up ° ° °Pt calling back °

## 2016-09-29 NOTE — Telephone Encounter (Signed)
Follow up ° ° ° ° ° °Returning a call to the nurse °

## 2016-09-29 NOTE — Telephone Encounter (Signed)
I spoke with the patient. He is aware I have spoken with rehab and they are not requesting a clearance from [place]. Per the patient, they discontinued his PT due to a-fib and elevated heart rates- PT wanted him to have a stress test per his report.  He thought his appointment on Monday was for a stress test with the a-fib clinic. I advised him that the appointment is for on office visit only with Roderic Palau, NP. He will keep this appointment.   I also advised him that the 1st urine jug he filled up was not a [facility] jug.  He states he went to [facility] for this- I advised that I unclear on what transpired the 1st time, but his 2nd jug has been sent of testing.  He is agreeable.

## 2016-09-29 NOTE — Telephone Encounter (Signed)
Late entry- I spoke with the patient last Thursday 09/21/16. He stated at the time, that he had performed his 24 hour urine test only to turn this in and be told he had collected this in the wrong jug (no preservatives).  He states he picked this up and returned it to [facility]. I had previously advised him that our lab tech, whom is associated with [facility], had printed his orders and taken a jug downstairs to [facility], so I was unsure how he had obtained the wrong jug to collect the urine.  He states that " a blonde headed girl" gave him the jug and said it was the correct one. I advised him at the time that I would need to investigate what had transpired and call him back. He also stated that he need Dr. Caryl Comes to contact rehab and give him clearance to participate in this. I advised I would have to call rehab and follow up on this. He stated he sees Robin [phone].  I attempted to call the patient back today- I left him a message to call. After speaking with our Manti, she reports she took the jug and orders down to [facility] and they have been there for the patient. When he returned the jug, this was returned in a jug that is not one that [facility] even has, so it is unclear where he actually obtained the jug.  Per Katrina, there are also no "blonde headed" girls that work downstairs at [employer].  I also spoke with neuro rehab to see if we needed to clear the patient to participate in therapy. Per staff, the patient had PT this week and was released from PT. They were able to perform all of her normal exercises with him. He is to continue with speech and OT, but they do not require clearance from [place].

## 2016-10-02 ENCOUNTER — Ambulatory Visit (HOSPITAL_COMMUNITY)
Admission: RE | Admit: 2016-10-02 | Discharge: 2016-10-02 | Disposition: A | Discharge status: Home / Self Care | Payer: Medicare Other | Source: Ambulatory Visit | Attending: Nurse Practitioner | Admitting: Nurse Practitioner

## 2016-10-02 ENCOUNTER — Encounter (HOSPITAL_COMMUNITY): Payer: Self-pay | Admitting: Nurse Practitioner

## 2016-10-02 VITALS — BP 142/72 | HR 67 | Ht 68.0 in | Wt 184.2 lb

## 2016-10-02 DIAGNOSIS — I480 Paroxysmal atrial fibrillation: Secondary | ICD-10-CM

## 2016-10-02 DIAGNOSIS — Z888 Allergy status to other drugs, medicaments and biological substances status: Secondary | ICD-10-CM | POA: Insufficient documentation

## 2016-10-02 DIAGNOSIS — Z9889 Other specified postprocedural states: Secondary | ICD-10-CM | POA: Insufficient documentation

## 2016-10-02 DIAGNOSIS — I10 Essential (primary) hypertension: Secondary | ICD-10-CM | POA: Diagnosis not present

## 2016-10-02 DIAGNOSIS — Z7901 Long term (current) use of anticoagulants: Secondary | ICD-10-CM | POA: Insufficient documentation

## 2016-10-02 DIAGNOSIS — Z79899 Other long term (current) drug therapy: Secondary | ICD-10-CM | POA: Diagnosis not present

## 2016-10-02 DIAGNOSIS — Z9049 Acquired absence of other specified parts of digestive tract: Secondary | ICD-10-CM | POA: Insufficient documentation

## 2016-10-02 DIAGNOSIS — I4892 Unspecified atrial flutter: Secondary | ICD-10-CM | POA: Diagnosis present

## 2016-10-02 DIAGNOSIS — Z833 Family history of diabetes mellitus: Secondary | ICD-10-CM | POA: Diagnosis not present

## 2016-10-02 DIAGNOSIS — Z8249 Family history of ischemic heart disease and other diseases of the circulatory system: Secondary | ICD-10-CM | POA: Diagnosis not present

## 2016-10-02 DIAGNOSIS — Z87442 Personal history of urinary calculi: Secondary | ICD-10-CM | POA: Diagnosis not present

## 2016-10-02 NOTE — Progress Notes (Signed)
Primary Care Physician: Vladimir Creeks, MD Referring Physician: Dr. Caryl Comes Neurology:Dr. Milledge Gerding is a 76 y.o. male with a h/o afib and prior stroke that asked to be seen in the afib clinic after V/S showed an increase and irregularity of heart rate on the 18 th of April. EKG shows aflutter with variable AV block.Per pt he is not symptomatic. He has refused anticoagulation in the past for desire to use a "natural" blood thinner. His heart rate is not that high in the 90's but could benefit from rate control as he is  currently not on any rate control drugs. Pt denies any alcohol, tobacco or excessive caffeine.  F/u 5/1. Ekg,despite heavy artifact from tremors, shows identifiable P waves. Pt states that he thinks he went back to Normal rhythm several days ago. His heart rate and BP att home have been normal. He was started on metoprolol at last visit as well as eliquis and is tolerating both.  F/u in afib clinic 5/14. His heart rate was found to be in the 140's while trying to exercise on the treadmill in rehab, but with his tremors, it is hard to sometimes difficult to get a good baseline because of  Artifact. It was suggested that he have a ETT to help guide with his rehab. In the clinic today, he is in SR at 66 bpm. He has not noted any sustained afib since I last saw him last.   Today, he denies symptoms of palpitations, chest pain, shortness of breath, orthopnea, PND, lower extremity edema, dizziness, presyncope, syncope, or neurologic sequela. The patient is tolerating medications without difficulties and is otherwise without complaint today.   Past Medical History:  Diagnosis Date  . Chronic left shoulder pain   . Hypertension   . Kidney stones   . Lead poisoning   . Nephrolithiasis   . 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 Vision abnormalities    Past Surgical History:  Procedure Laterality Date  . CHOLECYSTECTOMY    . LITHOTRIPSY      Current Outpatient  Prescriptions  Medication Sig Dispense Refill  . apixaban (ELIQUIS) 5 MG TABS tablet Take 1 tablet (5 mg total) by mouth 2 (two) times daily. 60 tablet 0  . Carbidopa-Levodopa (RYTARY PO) Take by mouth 3 (three) times daily.    . folic acid (FOLVITE) 1 MG tablet Take 1 tablet (1 mg total) by mouth daily. 30 tablet 0  . hydrochlorothiazide (MICROZIDE) 12.5 MG capsule Take 1 capsule (12.5 mg total) by mouth daily as needed (for blood pressure). 30 capsule 1  . Melatonin 3 MG TABS Take 2 tablets (6 mg total) by mouth at bedtime. 30 tablet 0  . metoprolol succinate (TOPROL XL) 25 MG 24 hr tablet Take 0.5 tablets (12.5 mg total) by mouth daily. 30 tablet 1  . NON FORMULARY Vitamin C powder mixed with sodium bicarb: Drink daily as directed    . OVER THE COUNTER MEDICATION Hemp oil: One squirt under the tongue twice a day to treat tremors    . spironolactone (ALDACTONE) 25 MG tablet Take 12.5 mg by mouth daily.    Marland Kitchen telmisartan (MICARDIS) 80 MG tablet Take 80 mg by mouth daily.     No current facility-administered medications for this encounter.     Allergies  Allergen Reactions  . Fish-Derived Products Other (See Comments)    Mercury content poisoned his body  . Kale Other (See Comments)    Caused toxicity  in the body  . Milk-Related Compounds Other (See Comments)    Increases phlegm  . Ciprofloxacin Other (See Comments)    Causes headaches  . Latex Itching    Social History   Social History  . Marital status: Divorced    Spouse name: N/A  . Number of children: N/A  . Years of education: N/A   Occupational History  . Not on file.   Social History Main Topics  . Smoking status: Never Smoker  . Smokeless tobacco: Never Used  . Alcohol use Yes     Comment: wine occasionally  . Drug use: No  . Sexual activity: Not on file   Other Topics Concern  . Not on file   Social History Narrative  . No narrative on file    Family History  Problem Relation Age of Onset  . Heart  disease Brother   . Diabetes Brother   . Diabetes Maternal Grandmother   . Heart attack Maternal Grandfather   . Heart attack Paternal Grandfather     ROS- All systems are reviewed and negative except as per the HPI above  Physical Exam: Vitals:   10/02/16 0930  BP: (!) 142/72  Pulse: 67  Weight: 184 lb 3.2 oz (83.6 kg)  Height: 5\' 8"  (1.727 m)   Wt Readings from Last 3 Encounters:  10/02/16 184 lb 3.2 oz (83.6 kg)  09/19/16 182 lb 9.6 oz (82.8 kg)  09/14/16 181 lb (82.1 kg)    Labs: Lab Results  Component Value Date   NA 143 07/17/2016   K 4.0 07/17/2016   CL 110 07/17/2016   CO2 28 07/17/2016   GLUCOSE 99 07/17/2016   BUN 16 07/17/2016   CREATININE 0.99 07/17/2016   CALCIUM 9.3 07/17/2016   Lab Results  Component Value Date   INR 1.04 07/10/2016   Lab Results  Component Value Date   CHOL 197 07/11/2016   HDL 66 07/11/2016   LDLCALC 118 (H) 07/11/2016   TRIG 65 07/11/2016     GEN- The patient is well appearing, alert and oriented x 3 today.  Head- normocephalic, atraumatic Eyes-  Sclera clear, conjunctiva pink Ears- hearing intact Oropharynx- clear Neck- supple, no JVP Lymph- no cervical lymphadenopathy Lungs- Clear to ausculation bilaterally, normal work of breathing Heart-  regular rate and rhythm, no murmurs, rubs or gallops, PMI not laterally displaced GI- soft, NT, ND, + BS Extremities- no clubbing, cyanosis, or edema, tremors of upper extremities MS- no significant deformity or atrophy Skin- no rash or lesion Psych- euthymic mood, full affect Neuro- strength and sensation are intact  EKG- heavy artifact from tremors but identifiable P waves   with SR at 66 bpm Epic records reviewed    Assessment and Plan: 1. H/o afib/flutter In SR today Continue eliquis 5 mg for chadsvasc score of at least 4 He has stopped his "natural" blood thinner Continue metoprolol ER 25 mg 1/2 tab a day for rate control He states that he has a 24 hour urine for  catecholamines, for excessive sweating, pending with Dr. Caryl Comes  His rehab and the pt would would like a ETT for baseline evaluation and will need f/u with Dr. Caryl Comes following this Considered a monitor for afib burden but am afraid the presence of artifact from tremors would negate the  findings of the monitor  Butch Penny C. Arlenis Blaydes, [facility] [address] [place], [place] [address] [phone]

## 2016-10-03 ENCOUNTER — Ambulatory Visit: Payer: Medicare Other | Admitting: Physical Therapy

## 2016-10-03 ENCOUNTER — Ambulatory Visit (INDEPENDENT_AMBULATORY_CARE_PROVIDER_SITE_OTHER): Payer: Medicare Other | Admitting: Internal Medicine

## 2016-10-03 ENCOUNTER — Ambulatory Visit: Payer: Medicare Other | Admitting: Occupational Therapy

## 2016-10-03 ENCOUNTER — Ambulatory Visit: Payer: Medicare Other | Admitting: Speech Pathology

## 2016-10-03 DIAGNOSIS — M6281 Muscle weakness (generalized): Secondary | ICD-10-CM

## 2016-10-03 DIAGNOSIS — I480 Paroxysmal atrial fibrillation: Secondary | ICD-10-CM

## 2016-10-03 DIAGNOSIS — R41841 Cognitive communication deficit: Secondary | ICD-10-CM | POA: Diagnosis not present

## 2016-10-03 DIAGNOSIS — I69354 Hemiplegia and hemiparesis following cerebral infarction affecting left non-dominant side: Secondary | ICD-10-CM

## 2016-10-03 DIAGNOSIS — R41844 Frontal lobe and executive function deficit: Secondary | ICD-10-CM

## 2016-10-03 DIAGNOSIS — R29818 Other symptoms and signs involving the nervous system: Secondary | ICD-10-CM

## 2016-10-03 DIAGNOSIS — R278 Other lack of coordination: Secondary | ICD-10-CM

## 2016-10-03 DIAGNOSIS — R29898 Other symptoms and signs involving the musculoskeletal system: Secondary | ICD-10-CM

## 2016-10-03 DIAGNOSIS — R41842 Visuospatial deficit: Secondary | ICD-10-CM

## 2016-10-03 LAB — METANEPHRINES, URINE, 24 HOUR
Metaneph Total, Ur: 38 ug/L
Metanephrines, 24H Ur: 99 ug/24 hr (ref 45–290)
Normetanephrine, 24H Ur: 481 ug/24 hr (ref 82–500)
Normetanephrine, Ur: 185 ug/L

## 2016-10-03 LAB — CATECHOLAMINES, FRACTIONATED, URINE, 24 HOUR
Dopamine , 24H Ur: 6978 ug/24 hr — ABNORMAL HIGH (ref 0–510)
Dopamine, Rand Ur: 2684 ug/L
EPINEPHRINE RAND UR: 3 ug/L
Epinephrine, 24H Ur: 8 ug/24 hr (ref 0–20)
NOREPINEPH RAND UR: 37 ug/L
NOREPINEPHRINE 24H UR: 96 ug/(24.h) (ref 0–135)

## 2016-10-03 MED ORDER — DABIGATRAN ETEXILATE MESYLATE 150 MG PO CAPS: 150.0000 mg | ORAL_CAPSULE | Freq: Two times a day (BID) | ORAL | 6 refills | Status: DC

## 2016-10-03 MED ORDER — FLECAINIDE ACETATE 100 MG PO TABS: 100.0000 mg | ORAL_TABLET | Freq: Two times a day (BID) | ORAL | 6 refills | Status: DC

## 2016-10-03 MED ORDER — DILTIAZEM HCL ER COATED BEADS 120 MG PO CP24: 120.0000 mg | ORAL_CAPSULE | Freq: Every day | ORAL | 6 refills | Status: DC

## 2016-10-03 NOTE — Patient Instructions (Addendum)
Medication Instructions: - Your physician has recommended you make the following change in your medication:  1) Stop eliquis 2) Start pradaxa 150 mg- take one tablet by mouth twice daily 3) Start flecainide 100 mg- take one tablet by mouth twice daily 4) Start diltiazem 120 mg- take one tablet by mouth once daily  Labwork: - none ordered  Procedures/Testing: - none ordered  Follow-Up: - Your physician recommends that you schedule a follow-up appointment in: 1 month with Roderic Palau, NP in the a-fib clinic.   Any Additional Special Instructions Will Be Listed Below (If Applicable).     If you need a refill on your cardiac medications before your next appointment, please call your pharmacy.

## 2016-10-03 NOTE — Patient Instructions (Addendum)
  Card sort with varying rules - plus one minus one; alternate colors in ascending orders; match color or suit;  Solitaire  Multiply 3 digits by 2 digits - make up problems  Follow up with [facility] - what is the next step? Come in and tell me next session  Volunteer at church - for interaction

## 2016-10-03 NOTE — Therapy (Addendum)
[facility] [address] [place], [place], [address] Phone: [phone]   Fax:  [phone]  Occupational Therapy Treatment  Patient Details  Name: Eric Pope MRN: [id number] Date of Birth: 09/14/1940 Referring Provider: Dr Andrew Kirsteins  Encounter Date: 10/03/2016      OT End of Session - 10/03/16 1138    Visit Number 15   Number of Visits 31   Date for OT Re-Evaluation 12/01/16   Authorization Type BC/BS Medicare G Code    Authorization Time Period 10/03/16-12/01/16   Authorization - Visit Number 15   Authorization - Number of Visits 20   OT Start Time 1107   OT Stop Time 1145   OT Time Calculation (min) 38 min   Activity Tolerance Patient tolerated treatment well   Behavior During Therapy Impulsive      Past Medical History:  Diagnosis Date  . Chronic left shoulder pain   . Hypertension   . Kidney stones   . Lead poisoning   . Nephrolithiasis   . 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 Vision abnormalities     Past Surgical History:  Procedure Laterality Date  . CHOLECYSTECTOMY    . LITHOTRIPSY      There were no vitals filed for this visit.      Subjective Assessment - 10/03/16 1234    Subjective  Pt reports working puzzles at home   Patient Stated Goals Pt goals = Improved cognition, visual perception.   Currently in Pain? Yes   Pain Score 5    Pain Location Shoulder   Pain Orientation Right   Pain Descriptors / Indicators Aching   Pain Type Chronic pain   Pain Onset More than a month ago   Pain Frequency Several days a week   Aggravating Factors  shoulder movement   Pain Relieving Factors rest   Multiple Pain Sites No               Treatment: Pt reports he is seeing eye doctor soon and he wants to know when he can drive. Therapist reinforced that pt is not ready to drive due to cognitive and visual perceptual deficits. Therapist discussed progress and new updated  goals. Copying numbers from 1 column to the next, all legible except for 1 number that was questionable. Completing a 24 piece puzzle, with mod v.c and difficulty, increased time required. Organizing a grocery list into categories with good accuracy.                 OT Short Term Goals - 10/03/16 1119      OT SHORT TERM GOAL #1   Title Pt will be mod I basic simulated ADL (dressing) all aspects w/o vc's -- check updated goals 11/02/16   Time 4   Period Weeks   Status Achieved     OT SHORT TERM GOAL #2   Title Pt will be Mod I memory strategies and implement during OT sessions   Time 4   Period Weeks   Status On-going  Pt verbalizes understanding of strategies, does not utilize independently     OT SHORT TERM GOAL #3   Title Pt will be Mod I simple snack/meal prep adhering to safety and attending to left side with min v.c.- revised goal, Pt will perform basic home management tasks such as laundry, light cleaning or microwave meal prep at a mod I level, demonstrating good safety awareness and attention to left side.     Time 4   Period Weeks   Status Revised  Pt is using microwave and rice cooker at home- partially met     OT SHORT TERM GOAL #4   Title Pt will perform mod complex tabletop scanning activities with  95% or better accuracy.- Revised Pt will perform mod complex tabletop scanning activities with 90% accuracy.   Time 4   Period Weeks   Status On-going  not consistent, grossly 75-90%     OT SHORT TERM GOAL #5   Title Pt will verbalize understanding of compensatory strategies for visual impairment.   Time 4   Period Weeks   Status Achieved     OT SHORT TERM GOAL #6   Title i with initial HEP.   Time 4   Status Achieved     OT SHORT TERM GOAL #7   Title Pt will be modified independent with updated HEP.   Baseline needs coordination HEP   Time 4   Period Weeks   Status New     OT SHORT TERM GOAL #8   Title Pt will demonstrate ability to write a short  3 sentence paragraph with 100% legibility and no significant decrease in letter size.   Time 4   Period Weeks   Status New           OT Long Term Goals - 10/03/16 1223      OT LONG TERM GOAL #1   Title Pt will navigate a busy environment and perform environmental scanning with 90% or better accuracy.   Time 8   Period Weeks   Status Partially Met  met for basic scanning without significant distractions- partially met     OT LONG TERM GOAL #2   Title Pt will be Mod I higher level cognitive tasks related to finances (bill pay and balancing a check book).   Time 8   Period Weeks   Status Deferred  Pt's brother is handling finances, deferred to ST     OT LONG TERM GOAL #3   Title Pt will demonstrate improved coordination on bilaterally  as seen by improved 9 Hold Peg score by 10 seconds in prep for functional use.   Baseline 07/25/16 = Left 49.03 seconds affected side (R = 108.84 seconds)    Time 8   Period Weeks   Status Achieved  09/26/16:  R-57.93sec, L-38.97sec     OT LONG TERM GOAL #4   Title Pt will be Mod I simple meal prep (fry and egg, make coffee) on stovetop while maintaining safety    Time 8   Period Weeks   Status Not Met  supervison recommended     OT LONG TERM GOAL #5   Title Pt will demonstrate ability to retrieve a lightweight object at 85* shoulder flexion with -10 elbow extension with his RUE.   Time 8   Period Weeks   Status Achieved  09/26/16:  shoulder flex 105*, elbow ext -30* (improved elbow ext at lower ROM)     OT LONG TERM GOAL #6   Title Pt will demonstrate ability to perform a physical and cognitive task simultaneously with 85% or better accuracy.   Time 8   Period Weeks   Status On-going  not met, pt is unable to multi task consistently     OT LONG TERM GOAL #7   Title Pt will perform mod complex environmental scanning in a busy environment with 90% or better accuracy.   Time 8   Period Weeks     Status New     OT LONG TERM GOAL #8    Title Pt will demonstrate improved fine motor coordination for ADLs as evidenced by decreasing bilateral 9 hole peg test score by 3 secs.   Baseline RUE 42.84 secs, LUE 34.53 secs   Time 8   Period Weeks   Status New               Plan - 10/03/16 1214    Clinical Impression Statement Pt demonstrates progress towards goals with beginning insight into deficits and improving scanning ability with basic tasks. Pt continues to demonstrate decreased safety awareness and impaired insight into how his defictis would affect pt's ability to drive or return to work. Pt is scheduled for neuropsych testing later this month.   Rehab Potential Fair   Clinical Impairments Affecting Rehab Potential has hired caregiver 10 hrs per week   OT Frequency 2x / week   OT Duration 8 weeks   OT Treatment/Interventions Self-care/ADL training;Therapeutic exercise;Patient/family education;Neuromuscular education;Balance training;Therapeutic exercises;DME and/or AE instruction;Therapeutic activities;Cognitive remediation/compensation;Visual/perceptual remediation/compensation   Plan address handwriting and issue coordination HEP   OT Home Exercise Plan PWR! moves in supine, shoulder flex with ball in sitting 08/28/16      Patient will benefit from skilled therapeutic intervention in order to improve the following deficits and impairments:  Decreased endurance, Impaired vision/preception, Decreased knowledge of precautions, Impaired perceived functional ability, Decreased activity tolerance, Decreased knowledge of use of DME, Decreased strength, Impaired sensation, Decreased balance, Decreased cognition, Decreased range of motion, Decreased coordination, Decreased safety awareness, Impaired UE functional use  Visit Diagnosis: Hemiplegia and hemiparesis following cerebral infarction affecting left non-dominant side ([place]) - Plan: Ot plan of care cert/re-cert  Visuospatial deficit - Plan: Ot plan of care  cert/re-cert  Muscle weakness (generalized) - Plan: Ot plan of care cert/re-cert  Other lack of coordination - Plan: Ot plan of care cert/re-cert  Frontal lobe and executive function deficit - Plan: Ot plan of care cert/re-cert  Other symptoms and signs involving the musculoskeletal system - Plan: Ot plan of care cert/re-cert  Other symptoms and signs involving the nervous system - Plan: Ot plan of care cert/re-cert    Problem List Patient Active Problem List   Diagnosis Date Noted  . 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 Gait disturbance, post-stroke 08/04/2016  . Hypertensive crisis   . PAF (paroxysmal atrial fibrillation) ([place])   . 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 Paroxysmal atrial fibrillation ([facility])   . 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 Acute ischemic stroke ([place])   . CVA (cerebral vascular accident) ([place]) 07/10/2016  . Nonallopathic lesion of sacroiliac region 01/09/2013  . Greater trochanteric bursitis of left hip 07/09/2012  . Nonallopathic lesion of lumbosacral region 05/07/2012  . 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 Left hip pain 01/10/2012  . Rotator cuff syndrome of right shoulder 11/09/2011  . Bicipital tendinitis 11/09/2011  . Parkinson's disease ([place]) 11/09/2011  . Hypertension 11/09/2011    RINE,KATHRYN 10/04/2016, 3:59 PM [name], OTR/L [phone] Phone: [phone] 3:59 PM 10/04/16 [facility] [address] [place], [place], [address] Phone: [phone]   Fax:  [phone]  Name: Eric Pope MRN: [id number] Date of Birth: 1940-09-08

## 2016-10-03 NOTE — Progress Notes (Unsigned)
Patient Care Team: Vladimir Creeks, MD as PCP - General (Vascular Surgery)   HPI  Eric Pope is a 76 y.o. male Seen having been sent from AFib clinic for treadmill He had been seen hosp 2/18 presenting with stroke, and found to have atrial fib  2/18  LVH EF 55% mild aortic stenosis ( mean gradient 12)  Denies palpitations   He has refused anticoagulation in the past, but following visit with [facility] he agreed and ahs been on apixoban   No bleeding issues  Over the same period he has noted significant diaphoresis.    He is having frequent atrial paroxysms of atfib with HR 130-150   Sinus rates are in 60-70  He had requested a 24 hr urine collection to exclude pheo>> abnormal Dopamine 7000K ( but takes DA precursor for parkinson)    Records and Results Reviewed --Afib clinic notes  Past Medical History:  Diagnosis Date  . Chronic left shoulder pain   . Hypertension   . Kidney stones   . Lead poisoning   . Nephrolithiasis   . 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 Vision abnormalities     Past Surgical History:  Procedure Laterality Date  . CHOLECYSTECTOMY    . LITHOTRIPSY      Current Outpatient Prescriptions  Medication Sig Dispense Refill  . Carbidopa-Levodopa (RYTARY PO) Take by mouth 3 (three) times daily.    . dabigatran (PRADAXA) 150 MG CAPS capsule Take 1 capsule (150 mg total) by mouth 2 (two) times daily. 60 capsule 6  . diltiazem (CARDIZEM CD) 120 MG 24 hr capsule Take 1 capsule (120 mg total) by mouth daily. 30 capsule 6  . flecainide (TAMBOCOR) 100 MG tablet Take 1 tablet (100 mg total) by mouth 2 (two) times daily. 60 tablet 6  . folic acid (FOLVITE) 1 MG tablet Take 1 tablet (1 mg total) by mouth daily. 30 tablet 0  . hydrochlorothiazide (MICROZIDE) 12.5 MG capsule Take 1 capsule (12.5 mg total) by mouth daily as needed (for blood pressure). 30 capsule 1  . Melatonin 3 MG TABS Take 2 tablets (6 mg total) by mouth at bedtime. 30 tablet 0  .  metoprolol succinate (TOPROL XL) 25 MG 24 hr tablet Take 0.5 tablets (12.5 mg total) by mouth daily. 30 tablet 1  . NON FORMULARY Vitamin C powder mixed with sodium bicarb: Drink daily as directed    . OVER THE COUNTER MEDICATION Hemp oil: One squirt under the tongue twice a day to treat tremors    . spironolactone (ALDACTONE) 25 MG tablet Take 12.5 mg by mouth daily.    Marland Kitchen telmisartan (MICARDIS) 80 MG tablet Take 80 mg by mouth daily.     No current facility-administered medications for this visit.     Allergies  Allergen Reactions  . Fish-Derived Products Other (See Comments)    Mercury content poisoned his body  . Kale Other (See Comments)    Caused toxicity in the body  . Milk-Related Compounds Other (See Comments)    Increases phlegm  . Ciprofloxacin Other (See Comments)    Causes headaches  . Latex Itching      Review of Systems negative except from HPI and PMH  Physical Exam HR 141` BP 128/73  Well developed and well nourished in no acute distress HENT normal E scleral and icterus clear Neck Supple JVP flat; carotids brisk and full Clear to ausculation Irregular rate and rhythm, no murmurs gallops or  rub Soft with active bowel sounds No clubbing cyanosis  Edema Alert and oriented, grossly normal motor and sensory function Skin Warm and Dry  ECG Personally reviewed   =afib 140s  Assessment and  Plan  Atrial fib- paroxysmal  Diaphoresis  parkinsons  Abnormal urine test for pheo  Frequent recurrences of afib with Rapid rate Will begin flecainide and dilt for AV nodal control  Concerning that diaphoresis is caused by apixoban seen in 0.4 %of folks  Will stop and try dabigitran-- he had previously not tolerated Rivaroxaban   I suspect his urine test is abnormal 2/2 antiparkinson meds  He is to followup with his parkinson MD  We spent more than 50% of our >25 min visit in face to face counseling regarding the above    Current medicines are reviewed at  length with the patient today .  The patient does  have concerns regarding medicines.As above

## 2016-10-03 NOTE — Therapy (Signed)
[facility] [address] [place], [place], [address] Phone: [phone]   Fax:  [phone]  Speech Language Pathology Treatment  Patient Details  Name: Eric Pope MRN: [id number] Date of Birth: 1940/12/13 Referring Provider: Dr. Alysia Penna  Encounter Date: 10/03/2016      End of Session - 10/03/16 1143    Visit Number 15   Number of Visits 17   Date for SLP Re-Evaluation 10/06/16   SLP Start Time 0931   SLP Stop Time  1016   SLP Time Calculation (min) 45 min   Activity Tolerance Patient tolerated treatment well      Past Medical History:  Diagnosis Date  . Chronic left shoulder pain   . Hypertension   . Kidney stones   . Lead poisoning   . Nephrolithiasis   . 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 Vision abnormalities     Past Surgical History:  Procedure Laterality Date  . CHOLECYSTECTOMY    . LITHOTRIPSY      There were no vitals filed for this visit.      Subjective Assessment - 10/03/16 0949    Subjective "He told me to correct this, but I didn't- can you help me?" re: ST homework   Currently in Pain? No/denies               ADULT SLP TREATMENT - 10/03/16 0953      General Information   Behavior/Cognition Cooperative     Treatment Provided   Treatment provided Cognitive-Linquistic     Pain Assessment   Pain Assessment No/denies pain     Cognitive-Linquistic Treatment   Treatment focused on Cognition;Patient/family/caregiver education   Skilled Treatment Pt required usual mod A to correct deduction. Functional logic reasoning with consistent mod to max A. Pt internally distracted., perseverating on loosing money. Instructed pt that he is internally distracted - he verbalizes awareness by requires mod A to reduce this.      Assessment / Recommendations / Plan   Plan Continue with current plan of care     Progression Toward Goals   Progression toward goals Not  progressing toward goals (comment)  internal distractions, memory          SLP Education - 10/03/16 1138    Education provided Yes   Education Details Cognitive activities to do at home, areas of impairment, compensations for attention/memory   Person(s) Educated Patient   Methods Explanation   Comprehension Verbalized understanding;Verbal cues required;Need further instruction          SLP Short Term Goals - 10/03/16 1142      SLP SHORT TERM GOAL #1   Title Pt will verbalize 3 cognitive impairment with occasional min A over 3 sessions.    Status Not Met     SLP SHORT TERM GOAL #2   Title Pt will solve simple functional reasoning, organization and attention to detail problems with 85% accuracy and occasional min A   Status Not Met     SLP SHORT TERM GOAL #3   Title Pt will demo selective attention for mod complex task for 5 minutes in quiet environment with rare min A back to task   Status Not Met          SLP Long Term Goals - 10/03/16 1142      SLP LONG TERM GOAL #1   Title Pt will ID errors on cognitive linguistic tasks with occasional min A   Time  1   Period Weeks   Status On-going     SLP LONG TERM GOAL #2   Title Pt will solve simple math, reasoning and organization tasks with 80% accuracy and occasional min A   Time 2   Period Weeks   Status Revised     SLP LONG TERM GOAL #3   Title Pt will attend to 8 minute simple tasks with occasional min A, resulting in at least 90% success   Time 2   Period Weeks   Status Revised     SLP LONG TERM GOAL #4   Title Pt will verbalize 3 cognitive impairment with occasional min A over 3 sessions.    Baseline 09/08/16; 09/26/16, 10/03/16   Time 2   Period Weeks   Status Achieved     SLP LONG TERM GOAL #5   Title Pt will solve simple functional reasoning, organization and attention to detail problems with 85% accuracy and occasional min A   Time 2   Period Weeks   Status On-going     SLP LONG TERM GOAL #6   Title  Pt will demo selective attention for mod complex task for 5 minutes in quiet environment with rare min A back to task   Time 2   Period Weeks   Status Achieved     SLP LONG TERM GOAL #7   Title Pt will demo selective attention for mod complex task for 5 minutes in quiet environment with rare min A back to task   Time 2   Period Weeks   Status On-going          Plan - 10/03/16 1139    Clinical Impression Statement Pt verbalizing intellectual awareness of cognitive deficits, however max A for emergent awaress and error awareness. Some reduced impulsivity in this structured environment. Pt continues to be significantly internall distracted about "loosing money" and h/o PD. I encouraged him to follow up with [facility], as this would be ideal living situation as pt lives alone.  Continue skilled ST to maximize cognition for safety, decision making and QOL.    Treatment/Interventions Compensatory strategies;Functional tasks;Cognitive reorganization;Internal/external aids;SLP instruction and feedback;Patient/family education   Potential to Achieve Goals Fair   Potential Considerations Ability to learn/carryover information;Family/community support;Previous level of function;Cooperation/participation level;Severity of impairments      Patient will benefit from skilled therapeutic intervention in order to improve the following deficits and impairments:   Cognitive communication deficit    Problem List Patient Active Problem List   Diagnosis Date Noted  . 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 Gait disturbance, post-stroke 08/04/2016  . Hypertensive crisis   . PAF (paroxysmal atrial fibrillation) ([place])   . 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 Paroxysmal atrial fibrillation ([facility])   . 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 Acute ischemic stroke ([place])   . CVA (cerebral vascular accident) ([place])  07/10/2016  . Nonallopathic lesion of sacroiliac region 01/09/2013  . Greater trochanteric bursitis of left hip 07/09/2012  . Nonallopathic lesion of lumbosacral region 05/07/2012  . 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 Left hip pain 01/10/2012  . Rotator cuff syndrome of right shoulder 11/09/2011  . Bicipital tendinitis 11/09/2011  . Parkinson's disease ([place]) 11/09/2011  . Hypertension 11/09/2011    Lovvorn, Annye Rusk MS, CCC-SLP 10/03/2016, 11:44 AM  [facility] [address] [place], [place], [address] Phone: [phone]  Fax:  [phone]   Name: Eric Pope MRN: [id number] Date of Birth: [birth year]

## 2016-10-05 ENCOUNTER — Ambulatory Visit: Payer: Medicare Other | Admitting: Physical Therapy

## 2016-10-05 ENCOUNTER — Ambulatory Visit: Payer: Medicare Other | Admitting: Speech Pathology

## 2016-10-05 ENCOUNTER — Ambulatory Visit: Payer: Medicare Other | Admitting: Occupational Therapy

## 2016-10-05 DIAGNOSIS — I69318 Other symptoms and signs involving cognitive functions following cerebral infarction: Secondary | ICD-10-CM

## 2016-10-05 DIAGNOSIS — R41841 Cognitive communication deficit: Secondary | ICD-10-CM

## 2016-10-05 DIAGNOSIS — R41842 Visuospatial deficit: Secondary | ICD-10-CM

## 2016-10-05 DIAGNOSIS — I69354 Hemiplegia and hemiparesis following cerebral infarction affecting left non-dominant side: Secondary | ICD-10-CM

## 2016-10-05 DIAGNOSIS — M6281 Muscle weakness (generalized): Secondary | ICD-10-CM

## 2016-10-05 NOTE — Patient Instructions (Signed)
Coordination Exercises  Perform the following exercises for 20 minutes 1 times per day. Perform with both hand(s). Perform using big movements. Perform while seated!   Flipping Cards: Place deck of cards on the table. Flip cards over by opening your hand big to grasp and then turn your palm up big.  Deal cards: Hold 1/2 or whole deck in your hand. Use thumb to push card off top of deck with one big push.  Rotate ball with fingertips: Pick up with fingers/thumb and move as much as you can with each turn/movement (clockwise and counter-clockwise).  Toss ball from one hand to the other: Toss big/high.  Pick up coins and place in coin bank or container: Pick up with big, intentional movements. Do not drag coin to the edge.  Pick up coins and stack one at a time: Pick up with big, intentional movements. Do not drag coin to the edge. (5-10 in a stack)  Pick up 5-10 coins one at a time and hold in palm. Then, move coins from palm to fingertips one at time and place in coin bank/container.  Practice writing: Slow down, write big, and focus on forming each letter.  Perform "Flicks"/hand stretches (PWR! Hands): Close hands then flick out your fingers with focus on opening hands, pulling wrists back, and extending elbows like you are pushing.

## 2016-10-05 NOTE — Therapy (Signed)
[facility] [address] [place], [place], [address] Phone: [phone]   Fax:  [phone]  Occupational Therapy Treatment  Patient Details  Name: Eric Pope MRN: [id number] Date of Birth: Feb 06, 1941 Referring Provider: Dr Alysia Penna  Encounter Date: 10/05/2016      OT End of Session - 10/05/16 1042    Visit Number 16   Number of Visits 31   Date for OT Re-Evaluation 12/01/16   Authorization Type BC/BS Medicare G Code    Authorization Time Period 10/03/16-12/01/16   Authorization - Visit Number 27   Authorization - Number of Visits 20   OT Start Time 1025   OT Stop Time 1105   OT Time Calculation (min) 40 min   Activity Tolerance Patient tolerated treatment well   Behavior During Therapy Impulsive      Past Medical History:  Diagnosis Date  . Chronic left shoulder pain   . Hypertension   . Kidney stones   . Lead poisoning   . Nephrolithiasis   . 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 Vision abnormalities     Past Surgical History:  Procedure Laterality Date  . CHOLECYSTECTOMY    . LITHOTRIPSY      There were no vitals filed for this visit.      Subjective Assessment - 10/05/16 1037    Subjective  Pt reports seeing the cardiologist    Patient Stated Goals Pt goals = Improved cognition, visual perception.                              OT Education - 10/05/16 1140    Education provided Yes   Education Details coordination HEP, Handwriting strategies   Person(s) Educated Patient   Methods Explanation;Demonstration;Verbal cues;Handout   Comprehension Verbalized understanding;Returned demonstration;Verbal cues required          OT Short Term Goals - 10/03/16 1119      OT SHORT TERM GOAL #1   Title Pt will be mod I basic simulated ADL (dressing) all aspects w/o vc's -- check updated goals 11/02/16   Time 4   Period Weeks   Status Achieved     OT SHORT TERM  GOAL #2   Title Pt will be Mod I memory strategies and implement during OT sessions   Time 4   Period Weeks   Status On-going  Pt verbalizes understanding of strategies, does not utilize independently     OT SHORT TERM GOAL #3   Title Pt will be Mod I simple snack/meal prep adhering to safety and attending to left side with min v.c.- revised goal, Pt will perform basic home management tasks such as laundry, light cleaning or microwave meal prep at a mod I level, demonstrating good safety awareness and attention to left side.   Time 4   Period Weeks   Status Revised  Pt is using microwave and rice cooker at home- partially met     OT SHORT TERM GOAL #4   Title Pt will perform mod complex tabletop scanning activities with  95% or better accuracy.- Revised Pt will perform mod complex tabletop scanning activities with 90% accuracy.   Time 4   Period Weeks   Status On-going  not consistent, grossly 75-90%     OT SHORT TERM GOAL #5   Title Pt will verbalize understanding of compensatory strategies for visual impairment.   Time 4   Period  Weeks   Status Achieved     OT SHORT TERM GOAL #6   Title i with initial HEP.   Time 4   Status Achieved     OT SHORT TERM GOAL #7   Title Pt will be modified independent with updated HEP.   Baseline needs coordination HEP   Time 4   Period Weeks   Status New     OT SHORT TERM GOAL #8   Title Pt will demonstrate ability to write a short 3 sentence paragraph with 100% legibility and no significant decrease in letter size.   Time 4   Period Weeks   Status New           OT Long Term Goals - 10/03/16 1223      OT LONG TERM GOAL #1   Title Pt will navigate a busy environment and perform environmental scanning with 90% or better accuracy.   Time 8   Period Weeks   Status Partially Met  met for basic scanning without significant distractions- partially met     OT LONG TERM GOAL #2   Title Pt will be Mod I higher level cognitive tasks  related to finances (bill pay and balancing a check book).   Time 8   Period Weeks   Status Deferred  Pt's brother is handling finances, deferred to [place] #3   Title Pt will demonstrate improved coordination on bilaterally  as seen by improved 9 Hold Peg score by 10 seconds in prep for functional use.   Baseline 07/25/16 = Left 49.03 seconds affected side (R = 108.84 seconds)    Time 8   Period Weeks   Status Achieved  09/26/16:  R-57.93sec, L-38.97sec     OT LONG TERM GOAL #4   Title Pt will be Mod I simple meal prep (fry and egg, make coffee) on stovetop while maintaining safety    Time 8   Period Weeks   Status Not Met  supervison recommended     OT LONG TERM GOAL #5   Title Pt will demonstrate ability to retrieve a lightweight object at 85* shoulder flexion with -10 elbow extension with his RUE.   Time 8   Period Weeks   Status Achieved  09/26/16:  shoulder flex 105*, elbow ext -30* (improved elbow ext at lower ROM)     OT LONG TERM GOAL #6   Title Pt will demonstrate ability to perform a physical and cognitive task simultaneously with 85% or better accuracy.   Time 8   Period Weeks   Status On-going  not met, pt is unable to multi task consistently     OT LONG TERM GOAL #7   Title Pt will perform mod complex environmental scanning in a busy environment with 90% or better accuracy.   Time 8   Period Weeks   Status New     OT LONG TERM GOAL #8   Title Pt will demonstrate improved fine motor coordination for ADLs as evidenced by decreasing bilateral 9 hole peg test score by 3 secs.   Baseline RUE 42.84 secs, LUE 34.53 secs   Time 8   Period Weeks   Status New               Plan - 10/05/16 1045    Clinical Impression Statement Pt is progressing towards goals for coordination HEP. Pt requires verbal cues for large amplitude movements and to stay focused on task.  Rehab Potential Fair   OT Frequency 2x / week   OT Duration 8 weeks   OT  Treatment/Interventions Self-care/ADL training;Therapeutic exercise;Patient/family education;Neuromuscular education;Balance training;Therapeutic exercises;DME and/or AE instruction;Therapeutic activities;Cognitive remediation/compensation;Visual/perceptual remediation/compensation   Plan reinforce coordination HEP PRN   OT Home Exercise Plan PWR! moves in supine, shoulder flex with ball in sitting 08/28/16,   Consulted and Agree with Plan of Care Patient      Patient will benefit from skilled therapeutic intervention in order to improve the following deficits and impairments:  Decreased endurance, Impaired vision/preception, Decreased knowledge of precautions, Impaired perceived functional ability, Decreased activity tolerance, Decreased knowledge of use of DME, Decreased strength, Impaired sensation, Decreased balance, Decreased cognition, Decreased range of motion, Decreased coordination, Decreased safety awareness, Impaired UE functional use  Visit Diagnosis: Hemiplegia and hemiparesis following cerebral infarction affecting left non-dominant side (HCC)  Other symptoms and signs involving cognitive functions following cerebral infarction  Visuospatial deficit  Muscle weakness (generalized)    Problem List Patient Active Problem List   Diagnosis Date Noted  . 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 Gait disturbance, post-stroke 08/04/2016  . Hypertensive crisis   . PAF (paroxysmal atrial fibrillation) ([place])   . 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 Paroxysmal atrial fibrillation (HCC)   . 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 Acute ischemic stroke ([place])   . CVA (cerebral vascular accident) ([place]) 07/10/2016  . Nonallopathic lesion of sacroiliac region 01/09/2013  . Greater trochanteric bursitis of left hip 07/09/2012  . Nonallopathic lesion of lumbosacral region 05/07/2012  . Cervical disc  herniation 04/17/2012  . Left hip pain 01/10/2012  . Rotator cuff syndrome of right shoulder 11/09/2011  . Bicipital tendinitis 11/09/2011  . Parkinson's disease ([place]) 11/09/2011  . Hypertension 11/09/2011    Paylin Hailu 10/05/2016, 11:41 AM  [facility] [address] [place], [place], [address] Phone: [phone]   Fax:  [phone]  Name: Eric Pope MRN: [id number] Date of Birth: 1941/01/26

## 2016-10-05 NOTE — Therapy (Signed)
[facility] [address] [place], [place], [address] Phone: [phone]   Fax:  [phone]  Speech Language Pathology Treatment  Patient Details  Name: Eric Pope MRN: [id number] Date of Birth: 07-02-1940 Referring Provider: Dr. Alysia Penna  Encounter Date: 10/05/2016      End of Session - 10/05/16 1024    Visit Number 16   Number of Visits 34   Date for SLP Re-Evaluation 12/01/16   SLP Start Time 0933   SLP Stop Time  1016   SLP Time Calculation (min) 43 min      Past Medical History:  Diagnosis Date  . Chronic left shoulder pain   . Hypertension   . Kidney stones   . Lead poisoning   . Nephrolithiasis   . 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 Vision abnormalities     Past Surgical History:  Procedure Laterality Date  . CHOLECYSTECTOMY    . LITHOTRIPSY      There were no vitals filed for this visit.      Subjective Assessment - 10/05/16 0943    Subjective "I forgot my folder and homework - Melburn Popper came and I just forgot"               ADULT SLP TREATMENT - 10/05/16 0944      General Information   Behavior/Cognition Cooperative     Treatment Provided   Treatment provided Cognitive-Linquistic     Pain Assessment   Pain Assessment No/denies pain     Cognitive-Linquistic Treatment   Treatment focused on Cognition;Patient/family/caregiver education   Skilled Treatment Pt reports he is not forgetting to get meds refilled, has help setting up pill box 1x a week for the week.   aAtention to detail directions on simple organizing task ([employer]) with occasional min to mod A for error awareness/correction and to support working memory. Requried cues to check his work, as he left several slots open and did not attend to the last 2 directions. Working memory is affecting success/progress in therapy. Significant attention, memory and reasoning deficits persist - will renew pt to continue  skilled ST. Attempted to train to put appointments in phone - difficulty due to tremors - consistent max A to ID errors in entering dates/times into phone.      Assessment / Recommendations / Plan   Plan Goals updated  re-certification today     Progression Toward Goals   Progression toward goals Goals downgraded  re-cert today          SLP Education - 10/05/16 1023    Education provided Yes   Education Details Renewed goals for ST, recertification to continue [place] - 10/05/16 0952      SLP SHORT TERM GOAL #1   Title Pt will verbalize 3 cognitive impairment with occasional min A over 3 sessions.    Status Not Met     SLP SHORT TERM GOAL #2   Title Pt will solve simple functional reasoning, organization and attention to detail problems with 85% accuracy and occasional min A   Status Not Met     SLP SHORT TERM GOAL #3   Title Pt will demo selective attention for mod complex task for 5 minutes in quiet environment with rare min A back to task   Status Not Met          SLP Long Term Goals -  10-25-16 1029      SLP LONG TERM GOAL #1   Title Pt will ID errors on cognitive linguistic tasks with occasional min A   Baseline renewed 10-25-16   Time 8   Period Weeks   Status On-going     SLP LONG TERM GOAL #2   Title Pt will solve simple math, reasoning and organization tasks with 80% accuracy and occasional min A   Baseline renewed 2016/10/25   Time 8   Period Weeks   Status On-going     SLP LONG TERM GOAL #3   Title Pt will attend to 8 minute simple tasks with occasional min A, resulting in at least 90% success   Baseline re-newed Oct 25, 2016 - pt attending to simple tasks with 75% to 80% success   Time 8   Period Weeks   Status On-going     SLP LONG TERM GOAL #4   Title Pt will verbalize 3 cognitive impairment with occasional min A over 3 sessions.    Baseline 09/08/16; 09/26/16, 10/03/16   Time 2   Period Weeks   Status Achieved     SLP  LONG TERM GOAL #5   Title Pt will solve simple functional reasoning, organization and attention to detail problems with 85% accuracy and occasional min A   Baseline renewed 10-25-16   Time 8   Period Weeks   Status On-going     SLP LONG TERM GOAL #6   Title Pt will demo selective attention for mod complex task for 5 minutes in quiet environment with rare min A back to task   Time 2   Period Weeks   Status Achieved     SLP LONG TERM GOAL #7   Title Pt will utilize notebook or phone to manage follow ups, phone calls, physician appointments with occasional min A over 2 sessions   Baseline recert 09/08/35   Time 8   Period Weeks   Status New          Plan - 2016-10-25 1020    Clinical Impression Statement Pt has shown improvement with reduced impulsivity, improve intellectual and some emergent awareness (with mod A) and sustained attention. He continues to demonstrate significant deficts in  working memory, reasoning, awareness, alternating attention , organizing and problem solving. We continue to encourage pt to consider retirement community where he can get support, or live with family. I recommend pt continue skilled ST to maximize cognition for safety, medical management, and household management.    Speech Therapy Frequency 2x / week   Duration --  8 weeks, pending progress   Treatment/Interventions Compensatory strategies;Functional tasks;Cognitive reorganization;Internal/external aids;SLP instruction and feedback;Patient/family education   Potential to Achieve Goals Fair   Potential Considerations Ability to learn/carryover information;Family/community support;Previous level of function;Cooperation/participation level;Severity of impairments   Consulted and Agree with Plan of Care Patient      Patient will benefit from skilled therapeutic intervention in order to improve the following deficits and impairments:   Cognitive communication deficit      G-Codes - Oct 25, 2016 1025     Functional Limitations Attention   Attention Current Status (T0240) At least 40 percent but less than 60 percent impaired, limited or restricted   Attention Goal Status (X7353) At least 40 percent but less than 60 percent impaired, limited or restricted      Problem List Patient Active Problem List   Diagnosis Date Noted  . 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 Gait disturbance, post-stroke 08/04/2016  .  Hypertensive crisis   . PAF (paroxysmal atrial fibrillation) ([place])   . 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 Paroxysmal atrial fibrillation (HCC)   . 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 Acute ischemic stroke ([place])   . CVA (cerebral vascular accident) ([place]) 07/10/2016  . Nonallopathic lesion of sacroiliac region 01/09/2013  . Greater trochanteric bursitis of left hip 07/09/2012  . Nonallopathic lesion of lumbosacral region 05/07/2012  . 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 Left hip pain 01/10/2012  . Rotator cuff syndrome of right shoulder 11/09/2011  . Bicipital tendinitis 11/09/2011  . Parkinson's disease ([place]) 11/09/2011  . Hypertension 11/09/2011    Lovvorn, Annye Rusk  MS, CCC-SLP 10/05/2016, 10:31 AM  [facility] [address] [place], [place], [address] Phone: [phone]   Fax:  [phone]   Name: Eric Pope MRN: [id number] Date of Birth: 03-26-1941

## 2016-10-06 ENCOUNTER — Telehealth: Payer: Self-pay | Admitting: Internal Medicine

## 2016-10-06 ENCOUNTER — Telehealth (HOSPITAL_COMMUNITY): Payer: Self-pay | Admitting: *Deleted

## 2016-10-06 NOTE — Telephone Encounter (Signed)
Pt called to clinic wanting "permission to return to outpt physical therapy" since he has returned to normal rhythm after starting flecainide. Roderic Palau NP stated he could return to exercising with physical therapy from her standpoint since heart rate is controlled in the 70-80s. Pt asked that I send this to outpt therapy - I reached out to [facility] and the receptionist stated that the patient was actually discharged from physical therapy on 5/10. He would need a new order to resume. Called pt to inform he would need to be in touch with Dr. Dianna Limbo office to have further orders for physical therapy but we would be happy to send permission to resume from cardiac standpoint if needed. Pt verbalized understanding and would be in touch with Dr. Dianna Limbo office.

## 2016-10-06 NOTE — Telephone Encounter (Signed)
Error

## 2016-10-09 ENCOUNTER — Telehealth (HOSPITAL_COMMUNITY): Payer: Self-pay | Admitting: *Deleted

## 2016-10-09 ENCOUNTER — Encounter (HOSPITAL_COMMUNITY): Payer: Self-pay | Admitting: *Deleted

## 2016-10-09 NOTE — Telephone Encounter (Signed)
Pt needs last ov note with cardiac clearance to return to PT sent to attn Robin at [phone]

## 2016-10-10 ENCOUNTER — Ambulatory Visit: Payer: Medicare Other

## 2016-10-10 ENCOUNTER — Ambulatory Visit: Payer: Medicare Other | Admitting: Occupational Therapy

## 2016-10-10 ENCOUNTER — Ambulatory Visit: Payer: Medicare Other | Admitting: Physical Therapy

## 2016-10-10 DIAGNOSIS — I69318 Other symptoms and signs involving cognitive functions following cerebral infarction: Secondary | ICD-10-CM

## 2016-10-10 DIAGNOSIS — R41841 Cognitive communication deficit: Secondary | ICD-10-CM | POA: Diagnosis not present

## 2016-10-10 DIAGNOSIS — I69354 Hemiplegia and hemiparesis following cerebral infarction affecting left non-dominant side: Secondary | ICD-10-CM

## 2016-10-10 DIAGNOSIS — R41842 Visuospatial deficit: Secondary | ICD-10-CM

## 2016-10-10 NOTE — Patient Instructions (Signed)
When donning underwear or pants,  sit down,  Find the tag to determine the back of the pants and orient pants accordingly Dress right leg first, then left leg in seated,  Make sure pants are not under feet, then stand up to pull up pants  Double check yourself in the mirror

## 2016-10-10 NOTE — Therapy (Signed)
[facility] [address] [place], [place], [address] Phone: [phone]   Fax:  [phone]  Occupational Therapy Treatment  Patient Details  Name: Eric Pope MRN: [id number] Date of Birth: 12/17/1940 Referring Provider: Dr Alysia Penna  Encounter Date: 10/10/2016      OT End of Session - 10/10/16 1511    Visit Number 17   Number of Visits 31   Date for OT Re-Evaluation 12/01/16   Authorization Type BC/BS Medicare G Code    Authorization Time Period 10/03/16-12/01/16   Authorization - Visit Number 69   Authorization - Number of Visits 20   OT Start Time 2355   OT Stop Time 1530   OT Time Calculation (min) 43 min   Activity Tolerance Patient tolerated treatment well   Behavior During Therapy [facility] for tasks assessed/performed      Past Medical History:  Diagnosis Date  . Chronic left shoulder pain   . Hypertension   . Kidney stones   . Lead poisoning   . Nephrolithiasis   . 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 Vision abnormalities     Past Surgical History:  Procedure Laterality Date  . CHOLECYSTECTOMY    . LITHOTRIPSY      There were no vitals filed for this visit.      Subjective Assessment - 10/10/16 1451    Subjective  Pt reports difficulty with telling time   Patient is accompained by: Family member   Patient Stated Goals Pt goals = Improved cognition, visual perception.   Currently in Pain? Yes   Pain Score 5    Pain Location Shoulder   Pain Orientation Right   Pain Descriptors / Indicators Aching   Pain Type Chronic pain   Pain Onset More than a month ago   Pain Frequency Several days a week   Aggravating Factors  abduction   Pain Relieving Factors rest   Effect of Pain on Daily Activities limits functional use   Multiple Pain Sites No           Treatment:  Tabletop scanning task to locate and match clock faces with the correct time, increased time and min-mod v.c for  organized scan pattern. Pt practiced donning/ doffing shorts in seated using adapted strategies such as dressing left leg first and locating back tag.                      OT Short Term Goals - 10/03/16 1119      OT SHORT TERM GOAL #1   Title Pt will be mod I basic simulated ADL (dressing) all aspects w/o vc's -- check updated goals 11/02/16   Time 4   Period Weeks   Status Achieved     OT SHORT TERM GOAL #2   Title Pt will be Mod I memory strategies and implement during OT sessions   Time 4   Period Weeks   Status On-going  Pt verbalizes understanding of strategies, does not utilize independently     OT SHORT TERM GOAL #3   Title Pt will be Mod I simple snack/meal prep adhering to safety and attending to left side with min v.c.- revised goal, Pt will perform basic home management tasks such as laundry, light cleaning or microwave meal prep at a mod I level, demonstrating good safety awareness and attention to left side.   Time 4   Period Weeks   Status Revised  Pt is using  microwave and rice cooker at home- partially met     OT SHORT TERM GOAL #4   Title Pt will perform mod complex tabletop scanning activities with  95% or better accuracy.- Revised Pt will perform mod complex tabletop scanning activities with 90% accuracy.   Time 4   Period Weeks   Status On-going  not consistent, grossly 75-90%     OT SHORT TERM GOAL #5   Title Pt will verbalize understanding of compensatory strategies for visual impairment.   Time 4   Period Weeks   Status Achieved     OT SHORT TERM GOAL #6   Title i with initial HEP.   Time 4   Status Achieved     OT SHORT TERM GOAL #7   Title Pt will be modified independent with updated HEP.   Baseline needs coordination HEP   Time 4   Period Weeks   Status New     OT SHORT TERM GOAL #8   Title Pt will demonstrate ability to write a short 3 sentence paragraph with 100% legibility and no significant decrease in letter size.    Time 4   Period Weeks   Status New           OT Long Term Goals - 10/03/16 1223      OT LONG TERM GOAL #1   Title Pt will navigate a busy environment and perform environmental scanning with 90% or better accuracy.   Time 8   Period Weeks   Status Partially Met  met for basic scanning without significant distractions- partially met     OT LONG TERM GOAL #2   Title Pt will be Mod I higher level cognitive tasks related to finances ([place]ope pay and balancing a check book).   Time 8   Period Weeks   Status Deferred  Pt's brother is handling finances, deferred to [place] #3   Title Pt will demonstrate improved coordination on bilaterally  as seen by improved 9 Hold Peg score by 10 seconds in prep for functional use.   Baseline 07/25/16 = Left 49.03 seconds affected side (R = 108.84 seconds)    Time 8   Period Weeks   Status Achieved  09/26/16:  R-57.93sec, L-38.97sec     OT LONG TERM GOAL #4   Title Pt will be Mod I simple meal prep (fry and egg, make coffee) on stovetop while maintaining safety    Time 8   Period Weeks   Status Not Met  supervison recommended     OT LONG TERM GOAL #5   Title Pt will demonstrate ability to retrieve a lightweight object at 85* shoulder flexion with -10 elbow extension with his RUE.   Time 8   Period Weeks   Status Achieved  09/26/16:  shoulder flex 105*, elbow ext -30* (improved elbow ext at lower ROM)     OT LONG TERM GOAL #6   Title Pt will demonstrate ability to perform a physical and cognitive task simultaneously with 85% or better accuracy.   Time 8   Period Weeks   Status On-going  not met, pt is unable to multi task consistently     OT LONG TERM GOAL #7   Title Pt will perform mod complex environmental scanning in a busy environment with 90% or better accuracy.   Time 8   Period Weeks   Status New     OT LONG TERM GOAL #8  Title Pt will demonstrate improved fine motor coordination for ADLs as evidenced by  decreasing bilateral 9 hole peg test score by 3 secs.   Baseline RUE 42.84 secs, LUE 34.53 secs   Time 8   Period Weeks   Status New               Plan - 10/10/16 1513    Clinical Impression Statement Pt is progressing towards goals. He demonstrates improving tabletop scanning with increased time, min- mod  prompts for organized scan pattern.   Rehab Potential Fair   OT Frequency 2x / week   OT Duration 8 weeks   OT Treatment/Interventions Self-care/ADL training;Therapeutic exercise;Patient/family education;Neuromuscular education;Balance training;Therapeutic exercises;DME and/or AE instruction;Therapeutic activities;Cognitive remediation/compensation;Visual/perceptual remediation/compensation   Plan reinforce coordination HEP   OT Home Exercise Plan PWR! moves in supine, shoulder flex with ball in sitting 08/28/16,   Consulted and Agree with Plan of Care Patient      Patient will benefit from skilled therapeutic intervention in order to improve the following deficits and impairments:  Decreased endurance, Impaired vision/preception, Decreased knowledge of precautions, Impaired perceived functional ability, Decreased activity tolerance, Decreased knowledge of use of DME, Decreased strength, Impaired sensation, Decreased balance, Decreased cognition, Decreased range of motion, Decreased coordination, Decreased safety awareness, Impaired UE functional use  Visit Diagnosis: Hemiplegia and hemiparesis following cerebral infarction affecting left non-dominant side (HCC)  Other symptoms and signs involving cognitive functions following cerebral infarction  Visuospatial deficit    Problem List Patient Active Problem List   Diagnosis Date Noted  . 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 Gait disturbance, post-stroke 08/04/2016  . Hypertensive crisis   . PAF (paroxysmal atrial fibrillation) ([place])   . 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 Left-sided  neglect 07/14/2016  . Paroxysmal atrial fibrillation (HCC)   . 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 Acute ischemic stroke ([place])   . CVA (cerebral vascular accident) ([place]) 07/10/2016  . Nonallopathic lesion of sacroiliac region 01/09/2013  . Greater trochanteric bursitis of left hip 07/09/2012  . Nonallopathic lesion of lumbosacral region 05/07/2012  . 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 Left hip pain 01/10/2012  . Rotator cuff syndrome of right shoulder 11/09/2011  . Bicipital tendinitis 11/09/2011  . Parkinson's disease ([place]) 11/09/2011  . Hypertension 11/09/2011    Azaria Stegman 10/10/2016, 3:19 PM  [facility] [address] [place], [place], [address] Phone: [phone]   Fax:  [phone]  Name: Eric Pope MRN: [id number] Date of Birth: 05-Jul-1940

## 2016-10-11 NOTE — Therapy (Signed)
[facility] [address] [place], [place], [address] Phone: [phone]   Fax:  [phone]  Speech Language Pathology Treatment  Patient Details  Name: Eric Pope MRN: [id number] Date of Birth: [birth year] Referring Provider: Dr. Claudette Laws  Encounter Date: 10/10/2016      End of Session - 10/10/16 1652    Visit Number 17   Number of Visits 34   Date for SLP Re-Evaluation 12/01/16   SLP Start Time 1402   SLP Stop Time  1445   SLP Time Calculation (min) 43 min   Activity Tolerance Patient tolerated treatment well      Past Medical History:  Diagnosis Date  . Chronic left shoulder pain   . Hypertension   . Kidney stones   . Lead poisoning   . Nephrolithiasis   . 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 Vision abnormalities     Past Surgical History:  Procedure Laterality Date  . CHOLECYSTECTOMY    . LITHOTRIPSY      There were no vitals filed for this visit.      Subjective Assessment - 10/10/16 1418    Subjective Pt arrived with folder but with homework incomplete/not started.   Currently in Pain? No/denies               ADULT SLP TREATMENT - 10/10/16 1702      General Information   Behavior/Cognition Alert;Cooperative;Pleasant mood;Distractible     Treatment Provided   Treatment provided Cognitive-Linquistic     Cognitive-Linquistic Treatment   Treatment focused on Cognition;Patient/family/caregiver education   Skilled Treatment SLP and pt worked on [employer] auditory attention/working memory today with verbal tasks. Pt with good success with calling out change amount when provided with coin combinations </= $1.00. However when task switched to time-based auditory task, pt's success decr'd to 2/6, and this incr'd to 50% with self correction. Mod cues were needed for further success. SLP educated pt on home tasks to do for concentration and working memory. SLP also explained pt's goal  status to him. Pt was pleased he made progress with awareness but voiced realization he needs more success with emergent awareness when told by SLP.     Assessment / Recommendations / Plan   Plan Continue with current plan of care     Progression Toward Goals   Progression toward goals Progressing toward goals            SLP Short Term Goals - 10/11/16 1055      SLP SHORT TERM GOAL #1   Title Pt will verbalize 3 cognitive impairment with occasional min A over 3 sessions.    Status Not Met     SLP SHORT TERM GOAL #2   Title Pt will solve simple functional reasoning, organization and attention to detail problems with 85% accuracy and occasional min A   Status Not Met     SLP SHORT TERM GOAL #3   Title Pt will demo selective attention for mod complex task for 5 minutes in quiet environment with rare min A back to task   Status Not Met          SLP Long Term Goals - 10/11/16 1055      SLP LONG TERM GOAL #1   Title Pt will ID errors on cognitive linguistic tasks with occasional min A   Baseline renewed 10/05/16   Time 7   Period Weeks   Status On-going     SLP  LONG TERM GOAL #2   Title Pt will solve simple math, reasoning and organization tasks with 80% accuracy and occasional min A   Baseline renewed 10/05/16   Time 7   Period Weeks   Status On-going     SLP LONG TERM GOAL #3   Title Pt will attend to 8 minute simple tasks with occasional min A, resulting in at least 90% success   Baseline re-newed 10/05/16 - pt attending to simple tasks with 75% to 80% success   Time 7   Period Weeks   Status On-going     SLP LONG TERM GOAL #4   Title Pt will verbalize 3 cognitive impairment with occasional min A over 3 sessions.    Status Achieved     SLP LONG TERM GOAL #5   Title Pt will solve simple functional reasoning, organization and attention to detail problems with 85% accuracy and occasional min A   Baseline renewed 10/05/16   Time 7   Period Weeks   Status On-going      SLP LONG TERM GOAL #6   Title Pt will demo selective attention for mod complex task for 5 minutes in quiet environment with rare min A back to task   Status Achieved     SLP LONG TERM GOAL #7   Title Pt will utilize notebook or phone to manage follow ups, phone calls, physician appointments with occasional min A over 2 sessions   Baseline recert 0/09/38   Time 7   Period Weeks   Status New          Plan - 10/11/16 1052    Clinical Impression Statement Pt verbalizing intellectual awareness of cognitive deficits. Emergent awaress and error awareness are improved, but still lacking, since this SLP last saw pt. Although pt exhibited less hyperverbosity than last session with this SLP, some verbal impulsivity in this structured environment was still noted. Pt continues to be internally distracted and perseverated today about "losing money" and "poor decisions". Continue skilled ST to maximize cognition for safety, decision making and QOL.    Speech Therapy Frequency 2x / week   Duration --  8 weeks, pending progress   Treatment/Interventions Compensatory strategies;Functional tasks;Cognitive reorganization;Internal/external aids;SLP instruction and feedback;Patient/family education   Potential to Achieve Goals Fair   Potential Considerations Ability to learn/carryover information;Family/community support;Previous level of function;Cooperation/participation level;Severity of impairments      Patient will benefit from skilled therapeutic intervention in order to improve the following deficits and impairments:   Cognitive communication deficit    Problem List Patient Active Problem List   Diagnosis Date Noted  . 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 Gait disturbance, post-stroke 08/04/2016  . Hypertensive crisis   . PAF (paroxysmal atrial fibrillation) ([place])   . 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 Paroxysmal  atrial fibrillation (HCC)   . 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 Acute ischemic stroke ([place])   . CVA (cerebral vascular accident) ([place]) 07/10/2016  . Nonallopathic lesion of sacroiliac region 01/09/2013  . Greater trochanteric bursitis of left hip 07/09/2012  . Nonallopathic lesion of lumbosacral region 05/07/2012  . 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 Left hip pain 01/10/2012  . Rotator cuff syndrome of right shoulder 11/09/2011  . Bicipital tendinitis 11/09/2011  . Parkinson's disease ([place]) 11/09/2011  . Hypertension 11/09/2011    [place] ,[place], [place]  10/11/2016, 10:56 AM  [facility]  [address] [place], [place], [address] Phone: [phone]   Fax:  [phone]   Name: Eric Pope MRN: [id number] Date of Birth: [birth year]

## 2016-10-12 ENCOUNTER — Ambulatory Visit: Payer: Medicare Other | Admitting: Physical Therapy

## 2016-10-12 ENCOUNTER — Ambulatory Visit: Payer: Medicare Other | Admitting: Speech Pathology

## 2016-10-12 DIAGNOSIS — R41841 Cognitive communication deficit: Secondary | ICD-10-CM | POA: Diagnosis not present

## 2016-10-12 NOTE — Therapy (Signed)
[facility] [address] [place], [place], [address] Phone: [phone]   Fax:  [phone]  Speech Language Pathology Treatment  Patient Details  Name: Eric Pope MRN: [id number] Date of Birth: 1940/07/25 Referring Provider: Dr. Alysia Penna  Encounter Date: 10/12/2016      End of Session - 10/12/16 1209    Visit Number 18   Number of Visits 34   Date for SLP Re-Evaluation 12/01/16   SLP Start Time 1018   SLP Stop Time  1100   SLP Time Calculation (min) 42 min   Activity Tolerance Patient tolerated treatment well      Past Medical History:  Diagnosis Date  . Chronic left shoulder pain   . Hypertension   . Kidney stones   . Lead poisoning   . Nephrolithiasis   . 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 Vision abnormalities     Past Surgical History:  Procedure Laterality Date  . CHOLECYSTECTOMY    . LITHOTRIPSY      There were no vitals filed for this visit.      Subjective Assessment - 10/12/16 1022    Subjective "My blood sugar is variable and it affects my attention"               ADULT SLP TREATMENT - 10/12/16 1022      General Information   Behavior/Cognition Alert;Cooperative;Pleasant mood;Distractible     Treatment Provided   Treatment provided Cognitive-Linquistic     Cognitive-Linquistic Treatment   Treatment focused on Cognition;Patient/family/caregiver education   Skilled Treatment Pt reports flucuations in blood sugar as eating regularly is difficulty as pt lives alone.  Alternating attention between simple written/naming task and auditory/verbal task naming bills and coins that add up to given amount - pt reqiured repetition of 50% of the money amounts, showing some improvement in working memory and he ID'd error putting word in wrong category box 1/4 times, required cues other 3 errors. Pt required 2 cues to return to written task  after completing auditory/verbal  task. As simple conversation was added in, pt did not attend to conversation despite repetition and      Assessment / Recommendations / [place] with current plan of care     Progression Toward Goals   Progression toward goals Progressing toward goals          SLP Education - 10/12/16 1059    Education provided Yes   Education Details intellectual vs emergent awareness   Person(s) Educated Patient   Methods Explanation;Demonstration   Comprehension Verbalized understanding;Need further instruction          SLP Short Term Goals - 10/12/16 1209      SLP SHORT TERM GOAL #1   Title Pt will verbalize 3 cognitive impairment with occasional min A over 3 sessions.    Status Not Met     SLP SHORT TERM GOAL #2   Title Pt will solve simple functional reasoning, organization and attention to detail problems with 85% accuracy and occasional min A   Status Not Met     SLP SHORT TERM GOAL #3   Title Pt will demo selective attention for mod complex task for 5 minutes in quiet environment with rare min A back to task   Status Not Met          SLP Long Term Goals - 10/12/16 1209      SLP LONG TERM GOAL #1  Title Pt will ID errors on cognitive linguistic tasks with occasional min A   Baseline renewed 10/05/16   Time 7   Period Weeks   Status On-going     SLP LONG TERM GOAL #2   Title Pt will solve simple math, reasoning and organization tasks with 80% accuracy and occasional min A   Baseline renewed 10/05/16   Time 7   Period Weeks   Status On-going     SLP LONG TERM GOAL #3   Title Pt will attend to 8 minute simple tasks with occasional min A, resulting in at least 90% success   Baseline re-newed 10/05/16 - pt attending to simple tasks with 75% to 80% success   Time 7   Period Weeks   Status On-going     SLP LONG TERM GOAL #4   Title Pt will verbalize 3 cognitive impairment with occasional min A over 3 sessions.    Status Achieved     SLP LONG TERM GOAL #5    Title Pt will solve simple functional reasoning, organization and attention to detail problems with 85% accuracy and occasional min A   Baseline renewed 10/05/16   Time 7   Period Weeks   Status On-going     SLP LONG TERM GOAL #6   Title Pt will demo selective attention for mod complex task for 5 minutes in quiet environment with rare min A back to task   Status Achieved     SLP LONG TERM GOAL #7   Title Pt will utilize notebook or phone to manage follow ups, phone calls, physician appointments with occasional min A over 2 sessions   Baseline recert 5/46/27   Time 7   Period Weeks   Status New          Plan - 10/12/16 1209    Clinical Impression Statement Pt verbalizing intellectual awareness of cognitive deficits. Emergent awaress and error awareness are improved since this SLP last saw pt. Although pt exhibited less hyperverbosity than last session with this SLP, some verbal impulsivity in this structured environment was still noted. Pt continues to be internally distracted and perseverated today about "losing money" and "poor decisions". Continue skilled ST to maximize cognition for safety, decision making and QOL.    Speech Therapy Frequency 2x / week   Treatment/Interventions Compensatory strategies;Functional tasks;Cognitive reorganization;Internal/external aids;SLP instruction and feedback;Patient/family education   Potential to Achieve Goals Fair   Potential Considerations Ability to learn/carryover information;Family/community support;Previous level of function;Cooperation/participation level;Severity of impairments      Patient will benefit from skilled therapeutic intervention in order to improve the following deficits and impairments:   Cognitive communication deficit    Problem List Patient Active Problem List   Diagnosis Date Noted  . 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 Gait disturbance, post-stroke 08/04/2016  . Hypertensive crisis   . PAF  (paroxysmal atrial fibrillation) ([place])   . 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 Paroxysmal atrial fibrillation (HCC)   . 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 Acute ischemic stroke ([place])   . CVA (cerebral vascular accident) ([place]) 07/10/2016  . Nonallopathic lesion of sacroiliac region 01/09/2013  . Greater trochanteric bursitis of left hip 07/09/2012  . Nonallopathic lesion of lumbosacral region 05/07/2012  . 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 Left hip pain 01/10/2012  . Rotator cuff syndrome of right shoulder 11/09/2011  . Bicipital tendinitis 11/09/2011  . Parkinson's disease (  [place]) 11/09/2011  . Hypertension 11/09/2011    Davinia Riccardi, Annye Rusk MS, CCC-SLP 10/12/2016, 12:10 PM  [facility] [address] [place], [place], [address] Phone: [phone]   Fax:  [phone]   Name: Eric Pope MRN: [id number] Date of Birth: August 29, 1940

## 2016-10-13 ENCOUNTER — Encounter: Payer: Medicare Other | Admitting: Psychology

## 2016-10-14 ENCOUNTER — Emergency Department (HOSPITAL_BASED_OUTPATIENT_CLINIC_OR_DEPARTMENT_OTHER)
Admission: EM | Admit: 2016-10-14 | Discharge: 2016-10-14 | Disposition: A | Discharge status: Home / Self Care | Payer: Medicare Other | Attending: Emergency Medicine | Admitting: Emergency Medicine

## 2016-10-14 ENCOUNTER — Encounter (HOSPITAL_BASED_OUTPATIENT_CLINIC_OR_DEPARTMENT_OTHER): Payer: Self-pay | Admitting: Emergency Medicine

## 2016-10-14 DIAGNOSIS — Z79899 Other long term (current) drug therapy: Secondary | ICD-10-CM | POA: Diagnosis not present

## 2016-10-14 DIAGNOSIS — G20 Parkinson's disease: Secondary | ICD-10-CM | POA: Insufficient documentation

## 2016-10-14 DIAGNOSIS — Z7901 Long term (current) use of anticoagulants: Secondary | ICD-10-CM | POA: Diagnosis not present

## 2016-10-14 DIAGNOSIS — I10 Essential (primary) hypertension: Secondary | ICD-10-CM | POA: Diagnosis not present

## 2016-10-14 DIAGNOSIS — R040 Epistaxis: Secondary | ICD-10-CM | POA: Diagnosis present

## 2016-10-14 MED ORDER — OXYMETAZOLINE HCL 0.05 % NA SOLN
1.0000 | Freq: Once | NASAL | Status: AC
  Administered 2016-10-14: 1 via NASAL
  Filled 2016-10-14: qty 15

## 2016-10-14 NOTE — Discharge Instructions (Signed)
Use the afrin 2 times a day for the next 3 days.  Use vasoline to keep the nose moist.

## 2016-10-14 NOTE — ED Triage Notes (Signed)
Patient states that he is on pradaxia   - started to have a nosebleed about an hour ago

## 2016-10-14 NOTE — ED Provider Notes (Signed)
[facility] DEPT [facility] Provider Note   CSN: [id number] Arrival date & time: 10/14/16  1759 By signing my name below, I, Dyke Brackett, attest that this documentation has been prepared under the direction and in the presence of Blanchie Dessert, MD . Electronically Signed: Dyke Brackett, Scribe. 10/14/2016. 6:29 PM.   History   Chief Complaint Chief Complaint  Patient presents with  . Epistaxis  HPI Comments:  Palmer Fahrner is a 76 y.o. male with a history of A-fib on Pradaxa, HTN, CVA and Parkinson's disease who presents to the Emergency Department complaining of sudden onset, progressively improving epistaxis from left lateral nare onset 1.5 hours ago. Bleeding controlled upon arrival. No OTC treatments tried for these symptoms PTA. Pt reports that he has mild allergies and has been congested recently. Pt denies any recent postnasal drainage or recent URI and has no other acute complaints at this time.   The history is provided by the patient. No language interpreter was used.   Past Medical History:  Diagnosis Date  . Chronic left shoulder pain   . Hypertension   . Kidney stones   . Lead poisoning   . Nephrolithiasis   . 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 Vision abnormalities     Patient Active Problem List   Diagnosis Date Noted  . 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 Gait disturbance, post-stroke 08/04/2016  . Hypertensive crisis   . PAF (paroxysmal atrial fibrillation) ([place])   . 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 Paroxysmal atrial fibrillation (HCC)   . 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 Acute ischemic stroke ([place])   . CVA (cerebral vascular accident) ([place]) 07/10/2016  . Nonallopathic lesion of sacroiliac region 01/09/2013  . Greater trochanteric bursitis of left hip 07/09/2012  . Nonallopathic lesion of lumbosacral region 05/07/2012  . Cervical disc herniation  04/17/2012  . Left hip pain 01/10/2012  . Rotator cuff syndrome of right shoulder 11/09/2011  . Bicipital tendinitis 11/09/2011  . Parkinson's disease ([place]) 11/09/2011  . Hypertension 11/09/2011    Past Surgical History:  Procedure Laterality Date  . CHOLECYSTECTOMY    . LITHOTRIPSY         Home Medications    Prior to Admission medications   Medication Sig Start Date End Date Taking? Authorizing Provider  Carbidopa-Levodopa (RYTARY PO) Take by mouth 3 (three) times daily.    [provider]  dabigatran (PRADAXA) 150 MG CAPS capsule Take 1 capsule (150 mg total) by mouth 2 (two) times daily. 10/03/16   Deboraha Sprang, MD  diltiazem (CARDIZEM CD) 120 MG 24 hr capsule Take 1 capsule (120 mg total) by mouth daily. 10/03/16 01/01/17  Deboraha Sprang, MD  flecainide (TAMBOCOR) 100 MG tablet Take 1 tablet (100 mg total) by mouth 2 (two) times daily. 10/03/16   Deboraha Sprang, MD  folic acid (FOLVITE) 1 MG tablet Take 1 tablet (1 mg total) by mouth daily. 07/21/16   Angiulli, Lavon Paganini, PA-C  hydrochlorothiazide (MICROZIDE) 12.5 MG capsule Take 1 capsule (12.5 mg total) by mouth daily as needed (for blood pressure). 07/21/16   Angiulli, Lavon Paganini, PA-C  Melatonin 3 MG TABS Take 2 tablets (6 mg total) by mouth at bedtime. 07/21/16   Angiulli, Lavon Paganini, PA-C  metoprolol succinate (TOPROL XL) 25 MG 24 hr tablet Take 0.5 tablets (12.5 mg total) by mouth daily. 09/14/16   Sherran Needs, NP  NON FORMULARY Vitamin C powder mixed with sodium bicarb: Drink daily as directed    [provider]  OVER THE COUNTER MEDICATION Hemp oil: One squirt under the tongue twice a day to treat tremors    [provider]  spironolactone (ALDACTONE) 25 MG tablet Take 12.5 mg by mouth daily.    [provider]  telmisartan (MICARDIS) 80 MG tablet Take 80 mg by mouth daily.    [provider]    Family History Family History  Problem Relation Age of Onset  . Heart disease  Brother   . Diabetes Brother   . Diabetes Maternal Grandmother   . Heart attack Maternal Grandfather   . Heart attack Paternal Grandfather     Social History Social History  Substance Use Topics  . Smoking status: Never Smoker  . Smokeless tobacco: Never Used  . Alcohol use Yes     Comment: wine occasionally     Allergies   Fish-derived products; Kale; Milk-related compounds; Ciprofloxacin; and Latex  Review of Systems Review of Systems  All systems reviewed and are negative for acute change except as noted in the HPI.  Physical Exam Updated Vital Signs BP (!) 141/105 (BP Location: Left Arm) Comment: has tremors  Pulse 73   Temp 98.3 F (36.8 C) (Oral)   Resp 19   Ht 5\' 8"  (1.727 m)   Wt 184 lb (83.5 kg)   SpO2 98%   BMI 27.98 kg/m   Physical Exam  Constitutional: He is oriented to person, place, and time. He appears well-developed and well-nourished. No distress.  HENT:  Head: Normocephalic and atraumatic.  Fresh clot in left nare without active bleeding. Minimal blood in the posterior oropharynx  Eyes: Conjunctivae are normal.  Cardiovascular: Normal rate.   Pulmonary/Chest: Effort normal.  Abdominal: He exhibits no distension.  Neurological: He is alert and oriented to person, place, and time.  Resting tremor to bilateral upper extremities   Skin: Skin is warm and dry.  Psychiatric: He has a normal mood and affect.  Nursing note and vitals reviewed.  ED Treatments / Results  DIAGNOSTIC STUDIES:  Oxygen Saturation is 98% on RA, normal by my interpretation.    COORDINATION OF CARE:  6:22 PM Discussed treatment plan which includes Afrin with pt at bedside and pt agreed to plan.  Labs (all labs ordered are listed, but only abnormal results are displayed) Labs Reviewed - No data to display  EKG  EKG Interpretation None       Radiology No results found.  Procedures Procedures (including critical care time)  Medications Ordered in  ED Medications - No data to display   Initial Impression / Assessment and Plan / ED Course  I have reviewed the triage vital signs and the nursing notes.  Pertinent labs & imaging results that were available during my care of the patient were reviewed by me and considered in my medical decision making (see chart for details).     Patient is an elderly gentleman presenting today with acute onset of left-sided epistaxis. Patient is on anticoagulation due to atrial fibrillation and prior stroke. Upon arrival here her bleeding has subsided. He has a fresh clot present in the left snare but no active bleeding and no active bleeding in the posterior pharynx. Patient was monitored for over an hour without recurrent bleeding. Afrin applied. At this time patient does not appear to require packing. He will continue to use the Afrin and Vaseline. He was given strict return  precautions and follow-up as needed.  Patient expressed the desire to stop his anticoagulation however due to his high risk from atrial fibrillation and prior stroke discouraged him from stopping anticoagulation unless it was cleared by his PCP  Final Clinical Impressions(s) / ED Diagnoses   Final diagnoses:  Left-sided epistaxis    New Prescriptions New Prescriptions   No medications on file   I personally performed the services described in this documentation, which was scribed in my presence.  The recorded information has been reviewed and considered.     Blanchie Dessert, MD 10/14/16 [phone]

## 2016-10-17 ENCOUNTER — Ambulatory Visit (HOSPITAL_COMMUNITY): Payer: Medicare Other | Admitting: Nurse Practitioner

## 2016-10-19 ENCOUNTER — Ambulatory Visit: Payer: Medicare Other | Admitting: Occupational Therapy

## 2016-10-19 ENCOUNTER — Telehealth: Payer: Self-pay | Admitting: Neurology

## 2016-10-19 ENCOUNTER — Ambulatory Visit: Payer: Medicare Other | Admitting: Speech Pathology

## 2016-10-19 ENCOUNTER — Ambulatory Visit: Payer: Medicare Other | Admitting: Physical Therapy

## 2016-10-19 DIAGNOSIS — I69354 Hemiplegia and hemiparesis following cerebral infarction affecting left non-dominant side: Secondary | ICD-10-CM

## 2016-10-19 DIAGNOSIS — I69318 Other symptoms and signs involving cognitive functions following cerebral infarction: Secondary | ICD-10-CM

## 2016-10-19 DIAGNOSIS — R41844 Frontal lobe and executive function deficit: Secondary | ICD-10-CM

## 2016-10-19 DIAGNOSIS — R41842 Visuospatial deficit: Secondary | ICD-10-CM

## 2016-10-19 DIAGNOSIS — R278 Other lack of coordination: Secondary | ICD-10-CM

## 2016-10-19 DIAGNOSIS — R41841 Cognitive communication deficit: Secondary | ICD-10-CM | POA: Diagnosis not present

## 2016-10-19 NOTE — Therapy (Signed)
[facility] [address] [place], [place], [address] Phone: [phone]   Fax:  [phone]  Occupational Therapy Treatment  Patient Details  Name: Eric Pope MRN: [id number] Date of Birth: 09/22/1940 Referring Provider: Dr Alysia Penna  Encounter Date: 10/19/2016      OT End of Session - 10/19/16 1632    Visit Number 18   Number of Visits 31   Date for OT Re-Evaluation 12/01/16   Authorization Type BC/BS Medicare G Code    Authorization Time Period 10/03/16-12/01/16   Authorization - Visit Number 29   Authorization - Number of Visits 20   OT Start Time 7017   OT Stop Time 1445   OT Time Calculation (min) 40 min   Activity Tolerance Patient tolerated treatment well   Behavior During Therapy [facility] for tasks assessed/performed      Past Medical History:  Diagnosis Date  . Chronic left shoulder pain   . Hypertension   . Kidney stones   . Lead poisoning   . Nephrolithiasis   . 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 Vision abnormalities     Past Surgical History:  Procedure Laterality Date  . CHOLECYSTECTOMY    . LITHOTRIPSY      There were no vitals filed for this visit.      Subjective Assessment - 10/19/16 1417    Subjective  Pt requests info regarding driving eval   Patient is accompained by: Family member   Patient Stated Goals Pt goals = Improved cognition, visual perception.   Currently in Pain? Yes   Pain Score 5    Pain Location Shoulder   Pain Orientation Right   Pain Descriptors / Indicators Aching   Pain Type Chronic pain   Pain Onset More than a month ago   Pain Frequency Several days a week   Aggravating Factors  abduction   Pain Relieving Factors rest   Effect of Pain on Daily Activities limits functional use   Multiple Pain Sites No            Handwriting activity, with pt demonstrating grossly 80-90% legibility to print 3 sentences with foam grip. Functional problem  solving task to plan a breakfast within a budget, despite max assist pt was unable to complete correctly. Pt demonstrated difficulty organizing task and was unable to problem solve to complete correctly.                   OT Education - 10/19/16 1630    Education Details educated pt regarding driving evals and recommended pt waits until cognition improves to complete, discussed cognitive deficits organization, and problems solving, handwriting strategies and practice   Person(s) Educated Patient   Methods Explanation;Handout;Verbal cues;Demonstration   Comprehension Verbalized understanding;Returned demonstration;Verbal cues required          OT Short Term Goals - 10/03/16 1119      OT SHORT TERM GOAL #1   Title Pt will be mod I basic simulated ADL (dressing) all aspects w/o vc's -- check updated goals 11/02/16   Time 4   Period Weeks   Status Achieved     OT SHORT TERM GOAL #2   Title Pt will be Mod I memory strategies and implement during OT sessions   Time 4   Period Weeks   Status On-going  Pt verbalizes understanding of strategies, does not utilize independently     OT SHORT TERM GOAL #3   Title Pt will be  Mod I simple snack/meal prep adhering to safety and attending to left side with min v.c.- revised goal, Pt will perform basic home management tasks such as laundry, light cleaning or microwave meal prep at a mod I level, demonstrating good safety awareness and attention to left side.   Time 4   Period Weeks   Status Revised  Pt is using microwave and rice cooker at home- partially met     OT SHORT TERM GOAL #4   Title Pt will perform mod complex tabletop scanning activities with  95% or better accuracy.- Revised Pt will perform mod complex tabletop scanning activities with 90% accuracy.   Time 4   Period Weeks   Status On-going  not consistent, grossly 75-90%     OT SHORT TERM GOAL #5   Title Pt will verbalize understanding of compensatory strategies  for visual impairment.   Time 4   Period Weeks   Status Achieved     OT SHORT TERM GOAL #6   Title i with initial HEP.   Time 4   Status Achieved     OT SHORT TERM GOAL #7   Title Pt will be modified independent with updated HEP.   Baseline needs coordination HEP   Time 4   Period Weeks   Status New     OT SHORT TERM GOAL #8   Title Pt will demonstrate ability to write a short 3 sentence paragraph with 100% legibility and no significant decrease in letter size.   Time 4   Period Weeks   Status New           OT Long Term Goals - 10/03/16 1223      OT LONG TERM GOAL #1   Title Pt will navigate a busy environment and perform environmental scanning with 90% or better accuracy.   Time 8   Period Weeks   Status Partially Met  met for basic scanning without significant distractions- partially met     OT LONG TERM GOAL #2   Title Pt will be Mod I higher level cognitive tasks related to finances (bill pay and balancing a check book).   Time 8   Period Weeks   Status Deferred  Pt's brother is handling finances, deferred to [place] #3   Title Pt will demonstrate improved coordination on bilaterally  as seen by improved 9 Hold Peg score by 10 seconds in prep for functional use.   Baseline 07/25/16 = Left 49.03 seconds affected side (R = 108.84 seconds)    Time 8   Period Weeks   Status Achieved  09/26/16:  R-57.93sec, L-38.97sec     OT LONG TERM GOAL #4   Title Pt will be Mod I simple meal prep (fry and egg, make coffee) on stovetop while maintaining safety    Time 8   Period Weeks   Status Not Met  supervison recommended     OT LONG TERM GOAL #5   Title Pt will demonstrate ability to retrieve a lightweight object at 85* shoulder flexion with -10 elbow extension with his RUE.   Time 8   Period Weeks   Status Achieved  09/26/16:  shoulder flex 105*, elbow ext -30* (improved elbow ext at lower ROM)     OT LONG TERM GOAL #6   Title Pt will demonstrate  ability to perform a physical and cognitive task simultaneously with 85% or better accuracy.   Time 8   Period  Weeks   Status On-going  not met, pt is unable to multi task consistently     OT LONG TERM GOAL #7   Title Pt will perform mod complex environmental scanning in a busy environment with 90% or better accuracy.   Time 8   Period Weeks   Status New     OT LONG TERM GOAL #8   Title Pt will demonstrate improved fine motor coordination for ADLs as evidenced by decreasing bilateral 9 hole peg test score by 3 secs.   Baseline RUE 42.84 secs, LUE 34.53 secs   Time 8   Period Weeks   Status New               Plan - 10/19/16 1633    Clinical Impression Statement pt is progressing slowly towards goals. He remains limited by significant cognitive deficits and decreased awareness.   Rehab Potential Fair   Current Impairments/barriers affecting progress: has hired caregiver 10 hrs per week   OT Frequency 2x / week   OT Duration 8 weeks   OT Treatment/Interventions Self-care/ADL training;Therapeutic exercise;Patient/family education;Neuromuscular education;Balance training;Therapeutic exercises;DME and/or AE instruction;Therapeutic activities;Cognitive remediation/compensation;Visual/perceptual remediation/compensation   Plan reinforce coordination HEP   OT Home Exercise Plan PWR! moves in supine, shoulder flex with ball in sitting 08/28/16,   Consulted and Agree with Plan of Care Patient      Patient will benefit from skilled therapeutic intervention in order to improve the following deficits and impairments:  Decreased endurance, Impaired vision/preception, Decreased knowledge of precautions, Impaired perceived functional ability, Decreased activity tolerance, Decreased knowledge of use of DME, Decreased strength, Impaired sensation, Decreased balance, Decreased cognition, Decreased range of motion, Decreased coordination, Decreased safety awareness, Impaired UE functional  use  Visit Diagnosis: Hemiplegia and hemiparesis following cerebral infarction affecting left non-dominant side (HCC)  Other symptoms and signs involving cognitive functions following cerebral infarction  Visuospatial deficit  Frontal lobe and executive function deficit  Other lack of coordination    Problem List Patient Active Problem List   Diagnosis Date Noted  . 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 Gait disturbance, post-stroke 08/04/2016  . Hypertensive crisis   . PAF (paroxysmal atrial fibrillation) ([place])   . 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 Paroxysmal atrial fibrillation (HCC)   . 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 Acute ischemic stroke ([place])   . CVA (cerebral vascular accident) ([place]) 07/10/2016  . Nonallopathic lesion of sacroiliac region 01/09/2013  . Greater trochanteric bursitis of left hip 07/09/2012  . Nonallopathic lesion of lumbosacral region 05/07/2012  . 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 Left hip pain 01/10/2012  . Rotator cuff syndrome of right shoulder 11/09/2011  . Bicipital tendinitis 11/09/2011  . Parkinson's disease ([place]) 11/09/2011  . Hypertension 11/09/2011    RINE,KATHRYN 10/19/2016, 4:35 PM [name], OTR/L [phone] Phone: [phone] 4:37 PM 05/31/18[facility] [address] [place], [place], [address] Phone: [phone]   Fax:  [phone]  Name: Eric Pope MRN: [id number] Date of Birth: [birth year]

## 2016-10-19 NOTE — Patient Instructions (Signed)
Local Driver Evaluation Programs: ° °Comprehensive Evaluation: includes clinical and in vehicle behind the wheel testing by OCCUPATIONAL THERAPIST. Programs have varying levels of adaptive controls available for trial.  ° °[facility] °[address] °[place], [place]  [address] °[phone] or [phone] °http://www.driver-rehab.com °Evaluator:  Cyndee Crompton, OT/CDRS/CDI/SCDCM/Low Vision Certification ° °[facility] °[address] °[place], [place] [address] °[phone] °https://www.[facility].org/home/services/rehabilitation.aspx °Evaluators:  Shannon Sheek, OT and Jill Tucker, OT ° °[facility] [place] [facility] (ONLY SERVES VETERANS!!) °[facility] °[address] °[place], [place]  [address] °[phone] °http://www.[place].va.gov/services/Physical_Medicine_Rehabilitation_Services.asp °Evaluators:  Eric Andrews, KT; Heidi Harris, KT;  Gary Whitaker, KT (KT=kiniesotherapist) ° ° °Clinical evaluations only:  Includes clinical testing, refers to other programs or local certified driving instructor for behind the wheel testing. ° °[facility] (outpatient Rehab) °[facility] °[address] °[place], [place] [address] °[phone] for scheduling °http://[email or web address].edu/Outpatient-Rehabilitation/Neurorehabilitation-Therapy.htm °Evaluators:  Kelly Lambeth, OT; Kate Phillips, OT ° °Other area clinical evaluators available upon request including [facility], [facility] and [facility]. ° ° °    Resource List °What is a Driver Evaluation: °Your Road Ahead - A Guide to Comprehensive Driving Evaluations °http://www.thehartford.com/resources/mature-market-excellence/publications-on-aging ° °Association for [employer] - Disability and Driving Fact Sheets °http://www.aded.net/?page=510 ° °Driving after a Brain  Injury: °[employer] °http://www.biausa.org/tbims-abstracts/if-there-is-an-effective-way-to-determine-if-someone-is-ready-to-drive-after-tbi?A=SearchResult&SearchID=9495675&ObjectID=2758842&ObjectType=35 ° °Driving with Adaptive Equipment: °[facility] °http://www.driver-rehab.com/adaptive-equipment ° °[employer] °http://www.nmeda.com/ ° ° ° ° ° ° °  °

## 2016-10-19 NOTE — Telephone Encounter (Signed)
If patient calls back please ask him why does he need a sooner appt: also give him the following message: thanks   If patient calls back Dr.Sethi can call him back if he is having a non urgent issue or question. If its urgent he needs to seek the nearest ED. Pt has parkinson and sees another neurologist in [facility] for that.If its parkinson related he needs to call the neurologist in [facility] who manages that. Pt was seen 09/16/2016 for a hospital follow up.Pt is in therapy and has another appt 12/18/2016.   Rn left vm for patient to call back.

## 2016-10-20 ENCOUNTER — Ambulatory Visit: Payer: Medicare Other | Admitting: Occupational Therapy

## 2016-10-20 ENCOUNTER — Ambulatory Visit: Payer: Medicare Other | Admitting: Physical Therapy

## 2016-10-24 ENCOUNTER — Ambulatory Visit (INDEPENDENT_AMBULATORY_CARE_PROVIDER_SITE_OTHER): Payer: Medicare Other | Admitting: Internal Medicine

## 2016-10-24 DIAGNOSIS — I4819 Other persistent atrial fibrillation: Secondary | ICD-10-CM

## 2016-10-24 DIAGNOSIS — I481 Persistent atrial fibrillation: Secondary | ICD-10-CM

## 2016-10-24 MED ORDER — PROPAFENONE HCL 225 MG PO TABS: 225.0000 mg | ORAL_TABLET | Freq: Two times a day (BID) | ORAL | 6 refills | Status: DC

## 2016-10-24 MED ORDER — VERAPAMIL HCL ER 120 MG PO TBCR: 120.0000 mg | EXTENDED_RELEASE_TABLET | Freq: Every day | ORAL | 6 refills | Status: DC

## 2016-10-24 NOTE — Patient Instructions (Signed)
Medication Instructions: - Your physician has recommended you make the following change in your medication:  1) Stop diltiazem 2) Stop flecainide 3) Start Verapamil 120 mg - take one tablet by mouth once daily 4) Start Propafenone 225 mg- take one tablet by mouth twice daily  Labwork: - none ordered  Procedures/Testing: - none ordered  Follow-Up: - pending  Any Additional Special Instructions Will Be Listed Below (If Applicable). - Please obtain a pulse oximeter to track your heart rates - call in about 2 weeks to let Dr. Caryl Comes know what your heart rates are running, if they are above 70 beats a minute in 2 weeks, we will have you come for a repeat EKG to confirm your heart rhythm.     If you need a refill on your cardiac medications before your next appointment, please call your pharmacy.

## 2016-10-25 ENCOUNTER — Ambulatory Visit: Payer: Medicare Other | Admitting: Rehabilitative and Restorative Service Providers"

## 2016-10-25 ENCOUNTER — Ambulatory Visit: Payer: Medicare Other | Attending: Physical Medicine & Rehabilitation | Admitting: Occupational Therapy

## 2016-10-25 ENCOUNTER — Ambulatory Visit: Payer: Medicare Other

## 2016-10-25 DIAGNOSIS — I69318 Other symptoms and signs involving cognitive functions following cerebral infarction: Secondary | ICD-10-CM | POA: Diagnosis present

## 2016-10-25 DIAGNOSIS — R278 Other lack of coordination: Secondary | ICD-10-CM | POA: Insufficient documentation

## 2016-10-25 DIAGNOSIS — I69354 Hemiplegia and hemiparesis following cerebral infarction affecting left non-dominant side: Secondary | ICD-10-CM | POA: Insufficient documentation

## 2016-10-25 DIAGNOSIS — R41842 Visuospatial deficit: Secondary | ICD-10-CM | POA: Diagnosis present

## 2016-10-25 DIAGNOSIS — R29818 Other symptoms and signs involving the nervous system: Secondary | ICD-10-CM | POA: Diagnosis present

## 2016-10-25 DIAGNOSIS — R41841 Cognitive communication deficit: Secondary | ICD-10-CM | POA: Diagnosis present

## 2016-10-25 DIAGNOSIS — H53462 Homonymous bilateral field defects, left side: Secondary | ICD-10-CM | POA: Insufficient documentation

## 2016-10-25 DIAGNOSIS — M6281 Muscle weakness (generalized): Secondary | ICD-10-CM | POA: Insufficient documentation

## 2016-10-25 DIAGNOSIS — R41844 Frontal lobe and executive function deficit: Secondary | ICD-10-CM

## 2016-10-25 DIAGNOSIS — R2689 Other abnormalities of gait and mobility: Secondary | ICD-10-CM | POA: Diagnosis present

## 2016-10-25 DIAGNOSIS — R29898 Other symptoms and signs involving the musculoskeletal system: Secondary | ICD-10-CM | POA: Insufficient documentation

## 2016-10-25 NOTE — Therapy (Signed)
[facility] [address] [place], [place], [address] Phone: [phone]   Fax:  [phone]  Occupational Therapy Treatment  Patient Details  Name: Eric Pope MRN: [id number] Date of Birth: 1940/09/17 Referring Provider: Dr Alysia Penna  Encounter Date: 10/25/2016      OT End of Session - 10/25/16 1225    Visit Number 19   Number of Visits 31   Date for OT Re-Evaluation 12/01/16   Authorization Type BC/BS Medicare G Code    Authorization Time Period 10/03/16-12/01/16   Authorization - Visit Number 22   Authorization - Number of Visits 20   OT Start Time 1147   OT Stop Time 1230   OT Time Calculation (min) 43 min   Behavior During Therapy [facility] for tasks assessed/performed      Past Medical History:  Diagnosis Date  . Chronic left shoulder pain   . Hypertension   . Kidney stones   . Lead poisoning   . Nephrolithiasis   . 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 Vision abnormalities     Past Surgical History:  Procedure Laterality Date  . CHOLECYSTECTOMY    . LITHOTRIPSY      There were no vitals filed for this visit.      Subjective Assessment - 10/25/16 1254    Subjective  Pt reports going to the gym   Patient Stated Goals Pt goals = Improved cognition, visual perception.   Currently in Pain? Yes   Pain Score 4    Pain Location Shoulder   Pain Orientation Right   Pain Descriptors / Indicators Aching   Pain Type Chronic pain   Pain Onset More than a month ago   Pain Frequency Several days a week   Aggravating Factors  abduction   Pain Relieving Factors rest   Effect of Pain on Daily Activities limits functional use           Treatment: Discussion with pt regarding the need for caregiver education and recommendation that pt's caregiver comes in on Friday. Therapist has agreed to continue therapy with the understanding that pt will have caregiver involvement for improved  carryover. Reviewed supine PWR! up, rock and twist, min v.c. For proper performance, modified quadraped  PWR! Rock at [employer] v.c/ demonstration 10 reps each Completing a 12 piece puzzle in 7 mins 26 secs with min v.c for organization and visual perceptual skills. Pt was issued word search for home work. Pt was cautioned against using weights at gym, particularly abduction machine for shoulders due to risk for injury.                      OT Short Term Goals - 10/03/16 1119      OT SHORT TERM GOAL #1   Title Pt will be mod I basic simulated ADL (dressing) all aspects w/o vc's -- check updated goals 11/02/16   Time 4   Period Weeks   Status Achieved     OT SHORT TERM GOAL #2   Title Pt will be Mod I memory strategies and implement during OT sessions   Time 4   Period Weeks   Status On-going  Pt verbalizes understanding of strategies, does not utilize independently     OT SHORT TERM GOAL #3   Title Pt will be Mod I simple snack/meal prep adhering to safety and attending to left side with min v.c.- revised goal, Pt will perform basic home management  tasks such as laundry, light cleaning or microwave meal prep at a mod I level, demonstrating good safety awareness and attention to left side.   Time 4   Period Weeks   Status Revised  Pt is using microwave and rice cooker at home- partially met     OT SHORT TERM GOAL #4   Title Pt will perform mod complex tabletop scanning activities with  95% or better accuracy.- Revised Pt will perform mod complex tabletop scanning activities with 90% accuracy.   Time 4   Period Weeks   Status On-going  not consistent, grossly 75-90%     OT SHORT TERM GOAL #5   Title Pt will verbalize understanding of compensatory strategies for visual impairment.   Time 4   Period Weeks   Status Achieved     OT SHORT TERM GOAL #6   Title i with initial HEP.   Time 4   Status Achieved     OT SHORT TERM GOAL #7   Title Pt will be  modified independent with updated HEP.   Baseline needs coordination HEP   Time 4   Period Weeks   Status New     OT SHORT TERM GOAL #8   Title Pt will demonstrate ability to write a short 3 sentence paragraph with 100% legibility and no significant decrease in letter size.   Time 4   Period Weeks   Status New           OT Long Term Goals - 10/03/16 1223      OT LONG TERM GOAL #1   Title Pt will navigate a busy environment and perform environmental scanning with 90% or better accuracy.   Time 8   Period Weeks   Status Partially Met  met for basic scanning without significant distractions- partially met     OT LONG TERM GOAL #2   Title Pt will be Mod I higher level cognitive tasks related to finances (bill pay and balancing a check book).   Time 8   Period Weeks   Status Deferred  Pt's brother is handling finances, deferred to [place] #3   Title Pt will demonstrate improved coordination on bilaterally  as seen by improved 9 Hold Peg score by 10 seconds in prep for functional use.   Baseline 07/25/16 = Left 49.03 seconds affected side (R = 108.84 seconds)    Time 8   Period Weeks   Status Achieved  09/26/16:  R-57.93sec, L-38.97sec     OT LONG TERM GOAL #4   Title Pt will be Mod I simple meal prep (fry and egg, make coffee) on stovetop while maintaining safety    Time 8   Period Weeks   Status Not Met  supervison recommended     OT LONG TERM GOAL #5   Title Pt will demonstrate ability to retrieve a lightweight object at 85* shoulder flexion with -10 elbow extension with his RUE.   Time 8   Period Weeks   Status Achieved  09/26/16:  shoulder flex 105*, elbow ext -30* (improved elbow ext at lower ROM)     OT LONG TERM GOAL #6   Title Pt will demonstrate ability to perform a physical and cognitive task simultaneously with 85% or better accuracy.   Time 8   Period Weeks   Status On-going  not met, pt is unable to multi task consistently     OT LONG  TERM GOAL #7  Title Pt will perform mod complex environmental scanning in a busy environment with 90% or better accuracy.   Time 8   Period Weeks   Status New     OT LONG TERM GOAL #8   Title Pt will demonstrate improved fine motor coordination for ADLs as evidenced by decreasing bilateral 9 hole peg test score by 3 secs.   Baseline RUE 42.84 secs, LUE 34.53 secs   Time 8   Period Weeks   Status New               Plan - 10/25/16 1256    Clinical Impression Statement Pt is progressing slowly towards goals. He demonstrates improving visual perceptual skills for completing a basic puzzle.Therapist has recommended that pt has his hired caregiver come in 1x week   Rehab Potential Fair   Current Impairments/barriers affecting progress: has hired caregiver 10 hrs per week   OT Frequency 2x / week   OT Duration 8 weeks   OT Treatment/Interventions Self-care/ADL training;Therapeutic exercise;Patient/family education;Neuromuscular education;Balance training;Therapeutic exercises;DME and/or AE instruction;Therapeutic activities;Cognitive remediation/compensation;Visual/perceptual remediation/compensation   Plan caregiver education,      Patient will benefit from skilled therapeutic intervention in order to improve the following deficits and impairments:  Decreased endurance, Impaired vision/preception, Decreased knowledge of precautions, Impaired perceived functional ability, Decreased activity tolerance, Decreased knowledge of use of DME, Decreased strength, Impaired sensation, Decreased balance, Decreased cognition, Decreased range of motion, Decreased coordination, Decreased safety awareness, Impaired UE functional use  Visit Diagnosis: Hemiplegia and hemiparesis following cerebral infarction affecting left non-dominant side (HCC)  Other symptoms and signs involving cognitive functions following cerebral infarction  Visuospatial deficit  Frontal lobe and executive function  deficit  Other lack of coordination  Muscle weakness (generalized)    Problem List Patient Active Problem List   Diagnosis Date Noted  . 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 Gait disturbance, post-stroke 08/04/2016  . Hypertensive crisis   . PAF (paroxysmal atrial fibrillation) ([place])   . 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 Paroxysmal atrial fibrillation (HCC)   . 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 Acute ischemic stroke ([place])   . CVA (cerebral vascular accident) ([place]) 07/10/2016  . Nonallopathic lesion of sacroiliac region 01/09/2013  . Greater trochanteric bursitis of left hip 07/09/2012  . Nonallopathic lesion of lumbosacral region 05/07/2012  . 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 Left hip pain 01/10/2012  . Rotator cuff syndrome of right shoulder 11/09/2011  . Bicipital tendinitis 11/09/2011  . Parkinson's disease ([place]) 11/09/2011  . Hypertension 11/09/2011    Eric Pope 10/25/2016, 1:00 PM  [facility] [address] [place], [place], [address] Phone: [phone]   Fax:  [phone]  Name: Eric Pope MRN: [id number] Date of Birth: 05-Feb-1941

## 2016-10-26 NOTE — Patient Instructions (Signed)
  Please complete the assigned speech therapy homework prior to your next session and return it to the speech therapist at your next visit.  

## 2016-10-26 NOTE — Therapy (Signed)
[facility] [address] [place], [place], [address] Phone: [phone]   Fax:  [phone]  Speech Language Pathology Treatment  Patient Details  Name: Eric Pope MRN: [id number] Date of Birth: [birth year] Referring Provider: Dr. Alysia Penna  Encounter Date: 10/25/2016      End of Session - 10/26/16 1652    Visit Number 19   Number of Visits 34   Date for SLP Re-Evaluation 12/01/16   SLP Start Time 1319   SLP Stop Time  1400   SLP Time Calculation (min) 41 min      Past Medical History:  Diagnosis Date  . Chronic left shoulder pain   . Hypertension   . Kidney stones   . Lead poisoning   . Nephrolithiasis   . 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 Vision abnormalities     Past Surgical History:  Procedure Laterality Date  . CHOLECYSTECTOMY    . LITHOTRIPSY      There were no vitals filed for this visit.      Subjective Assessment - 10/25/16 1245    Subjective "I got sidetracked by my deficiencies with meaningful relationships."               ADULT SLP TREATMENT - 10/26/16 0001      General Information   Behavior/Cognition Alert;Cooperative;Pleasant mood;Distractible     Treatment Provided   Treatment provided Cognitive-Linquistic     Cognitive-Linquistic Treatment   Treatment focused on Cognition   Skilled Treatment Attention skills targeted today by SLP by having pt work with written tasks requiring simple alternating attention, and to work with emergent awareness. Pt able to sustain attention for 5 minutes with simple cognitive task, with 50% emergent awareness (2/4 errors). In card flipping task requiring pt to provide verbal answers, targeting awareness and alternating attention, pt demo'd emergent awareness 80% of the time, with min A from SLP needed to incr success to 100%.      Assessment / Recommendations / Plan   Plan Continue with current plan of care     Progression  Toward Goals   Progression toward goals Progressing toward goals            SLP Short Term Goals - 10/12/16 1209      SLP SHORT TERM GOAL #1   Title Pt will verbalize 3 cognitive impairment with occasional min A over 3 sessions.    Status Not Met     SLP SHORT TERM GOAL #2   Title Pt will solve simple functional reasoning, organization and attention to detail problems with 85% accuracy and occasional min A   Status Not Met     SLP SHORT TERM GOAL #3   Title Pt will demo selective attention for mod complex task for 5 minutes in quiet environment with rare min A back to task   Status Not Met          SLP Long Term Goals - 10/26/16 1658      SLP LONG TERM GOAL #1   Title Pt will ID errors on cognitive linguistic tasks with occasional min A   Baseline renewed 10/05/16   Time 6   Period Weeks   Status On-going     SLP LONG TERM GOAL #2   Title Pt will solve simple math, reasoning and organization tasks with 80% accuracy and occasional min A   Baseline renewed 10/05/16   Time 6   Period Weeks   Status On-going  SLP LONG TERM GOAL #3   Title Pt will attend to 8 minute simple tasks with occasional min A, resulting in at least 90% success   Baseline re-newed 10/05/16 - pt attending to simple tasks with 75% to 80% success   Time 6   Period Weeks   Status On-going     SLP LONG TERM GOAL #4   Title Pt will verbalize 3 cognitive impairment with occasional min A over 3 sessions.    Status Achieved     SLP LONG TERM GOAL #5   Title Pt will solve simple functional reasoning, organization and attention to detail problems with 85% accuracy and occasional min A   Baseline renewed 10/05/16   Time 6   Period Weeks   Status On-going     SLP LONG TERM GOAL #6   Title Pt will demo selective attention for mod complex task for 5 minutes in quiet environment with rare min A back to task   Status Achieved     SLP LONG TERM GOAL #7   Title Pt will utilize notebook or phone to  manage follow ups, phone calls, physician appointments with occasional min A over 2 sessions   Baseline recert 6/75/44   Time 6   Period Weeks   Status On-going          Plan - 10/26/16 1656    Clinical Impression Statement Emergent awaress and error awareness appear further improved since this SLP last saw pt. Some verbal impulsivity and perseveration remains ("bad decisions" in "s"). Pt continues to be mildly internally distracted and this negatively affected his emergent awareness and attention. Continue skilled ST to maximize cognition for safety, decision making and QOL.    Speech Therapy Frequency 2x / week   Duration --  8 weeks, pending progress   Treatment/Interventions Compensatory strategies;Functional tasks;Cognitive reorganization;Internal/external aids;SLP instruction and feedback;Patient/family education   Potential to Achieve Goals Fair   Potential Considerations Ability to learn/carryover information;Family/community support;Previous level of function;Cooperation/participation level;Severity of impairments      Patient will benefit from skilled therapeutic intervention in order to improve the following deficits and impairments:   Cognitive communication deficit    Problem List Patient Active Problem List   Diagnosis Date Noted  . 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 Gait disturbance, post-stroke 08/04/2016  . Hypertensive crisis   . PAF (paroxysmal atrial fibrillation) ([place])   . 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 Paroxysmal atrial fibrillation (HCC)   . 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 Acute ischemic stroke ([place])   . CVA (cerebral vascular accident) ([place]) 07/10/2016  . Nonallopathic lesion of sacroiliac region 01/09/2013  . Greater trochanteric bursitis of left hip 07/09/2012  . Nonallopathic lesion of lumbosacral region 05/07/2012  . Cervical disc herniation  04/17/2012  . Left hip pain 01/10/2012  . Rotator cuff syndrome of right shoulder 11/09/2011  . Bicipital tendinitis 11/09/2011  . Parkinson's disease ([place]) 11/09/2011  . Hypertension 11/09/2011    [facility] ,[place], [place]  10/26/2016, 4:59 PM  [facility] [address] [place] [employer] [place], [place], [address] Phone: [phone]   Fax:  [phone]   Name: Eric Pope MRN: [id number] Date of Birth: 07-Feb-1941

## 2016-10-27 ENCOUNTER — Ambulatory Visit: Payer: Medicare Other | Admitting: Physical Therapy

## 2016-10-27 ENCOUNTER — Ambulatory Visit: Payer: Medicare Other | Admitting: Occupational Therapy

## 2016-10-27 ENCOUNTER — Ambulatory Visit: Payer: Medicare Other

## 2016-10-27 DIAGNOSIS — R41844 Frontal lobe and executive function deficit: Secondary | ICD-10-CM

## 2016-10-27 DIAGNOSIS — I69354 Hemiplegia and hemiparesis following cerebral infarction affecting left non-dominant side: Secondary | ICD-10-CM

## 2016-10-27 DIAGNOSIS — M6281 Muscle weakness (generalized): Secondary | ICD-10-CM

## 2016-10-27 DIAGNOSIS — R41841 Cognitive communication deficit: Secondary | ICD-10-CM

## 2016-10-27 DIAGNOSIS — R41842 Visuospatial deficit: Secondary | ICD-10-CM

## 2016-10-27 DIAGNOSIS — R29818 Other symptoms and signs involving the nervous system: Secondary | ICD-10-CM

## 2016-10-27 DIAGNOSIS — R278 Other lack of coordination: Secondary | ICD-10-CM

## 2016-10-27 DIAGNOSIS — I69318 Other symptoms and signs involving cognitive functions following cerebral infarction: Secondary | ICD-10-CM

## 2016-10-27 DIAGNOSIS — R29898 Other symptoms and signs involving the musculoskeletal system: Secondary | ICD-10-CM

## 2016-10-27 NOTE — Therapy (Addendum)
[facility] [address] [place], [place], [address] Phone: [phone]   Fax:  [phone]  Occupational Therapy Treatment  Patient Details  Name: Eric Pope MRN: [id number] Date of Birth: 08-Jan-1941 Referring Provider: Dr Alysia Penna  Encounter Date: 10/27/2016      OT End of Session - 10/27/16 1619    Visit Number 20   Number of Visits 31   Date for OT Re-Evaluation 12/01/16   Authorization Type BC/BS Medicare G Code    Authorization Time Period 10/03/16-12/01/16   Authorization - Visit Number 72   Authorization - Number of Visits 30   OT Start Time 1020   OT Stop Time 1100   OT Time Calculation (min) 40 min      Past Medical History:  Diagnosis Date  . Chronic left shoulder pain   . Hypertension   . Kidney stones   . Lead poisoning   . Nephrolithiasis   . 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 Vision abnormalities     Past Surgical History:  Procedure Laterality Date  . CHOLECYSTECTOMY    . LITHOTRIPSY      There were no vitals filed for this visit.      Subjective Assessment - 10/27/16 1056    Subjective  therapist asked pt to have his caregiver come in today, caregiver was unable to attend today   Patient Stated Goals Pt goals = Improved cognition, visual perception.   Currently in Pain? Yes   Pain Score 3    Pain Location Shoulder   Pain Orientation Right   Pain Descriptors / Indicators Aching   Pain Type Chronic pain   Pain Onset More than a month ago   Pain Frequency Several days a week   Aggravating Factors  abduction   Pain Relieving Factors rest   Multiple Pain Sites No               Treatment: discussed with pt that his brother has info re: SCAT and meals on wheels, pt to discuss with his brother. Tabletop scanning to complete simple peg design, mod v.c to complete 1 section of task before moving on to the next section. Reinforce importance of double checking his  work                OT Education - 10/27/16 1618    Education provided Yes   Education Details Reviewed ball ex in supine for shoulder flexion and chest press, PWR! rock in modified quadraped at table, requested pt has caregiver attend 1 visit next week and pt may consider cancelling second visit due to finances.   Person(s) Educated Patient   Methods Explanation;Demonstration;Verbal cues;Handout   Comprehension Verbalized understanding;Returned demonstration;Verbal cues required          OT Short Term Goals - 10/27/16 1057      OT SHORT TERM GOAL #2   Title Pt will be Mod I memory strategies and implement during OT sessions   Time 4   Period Weeks   Status On-going     OT SHORT TERM GOAL #3   Title Pt will be Mod I simple snack/meal prep adhering to safety and attending to left side with min v.c.- revised goal, Pt will perform basic home management tasks such as laundry, light cleaning or microwave meal prep at a mod I level, demonstrating good safety awareness and attention to left side.   Status On-going  partially met- pt reports performing at home,  therapist to further assess in clinic     OT [place] #4   Title Pt will perform mod complex tabletop scanning activities with  95% or better accuracy.- Revised Pt will perform mod complex tabletop scanning activities with 90% accuracy.   Time 4   Period Weeks   Status On-going  grossly 75-80% today     OT SHORT TERM GOAL #5   Title Pt will verbalize understanding of compensatory strategies for visual impairment.   Status Achieved     OT SHORT TERM GOAL #6   Status Achieved     OT SHORT TERM GOAL #7   Title Pt will be modified independent with updated HEP.   Time 4   Period Weeks   Status On-going     OT SHORT TERM GOAL #8   Title Pt will demonstrate ability to write a short 3 sentence paragraph with 100% legibility and no significant decrease in letter size.   Time 4   Period Weeks   Status  On-going           OT Long Term Goals - 10/03/16 1223      OT LONG TERM GOAL #1   Title Pt will navigate a busy environment and perform environmental scanning with 90% or better accuracy.   Time 8   Period Weeks   Status Partially Met  met for basic scanning without significant distractions- partially met     OT LONG TERM GOAL #2   Title Pt will be Mod I higher level cognitive tasks related to finances (bill pay and balancing a check book).   Time 8   Period Weeks   Status Deferred  Pt's brother is handling finances, deferred to [place] #3   Title Pt will demonstrate improved coordination on bilaterally  as seen by improved 9 Hold Peg score by 10 seconds in prep for functional use.   Baseline 07/25/16 = Left 49.03 seconds affected side (R = 108.84 seconds)    Time 8   Period Weeks   Status Achieved  09/26/16:  R-57.93sec, L-38.97sec     OT LONG TERM GOAL #4   Title Pt will be Mod I simple meal prep (fry and egg, make coffee) on stovetop while maintaining safety    Time 8   Period Weeks   Status Not Met  supervison recommended     OT LONG TERM GOAL #5   Title Pt will demonstrate ability to retrieve a lightweight object at 85* shoulder flexion with -10 elbow extension with his RUE.   Time 8   Period Weeks   Status Achieved  09/26/16:  shoulder flex 105*, elbow ext -30* (improved elbow ext at lower ROM)     OT LONG TERM GOAL #6   Title Pt will demonstrate ability to perform a physical and cognitive task simultaneously with 85% or better accuracy.   Time 8   Period Weeks   Status On-going  not met, pt is unable to multi task consistently     OT LONG TERM GOAL #7   Title Pt will perform mod complex environmental scanning in a busy environment with 90% or better accuracy.   Time 8   Period Weeks   Status New     OT LONG TERM GOAL #8   Title Pt will demonstrate improved fine motor coordination for ADLs as evidenced by decreasing bilateral 9 hole peg test  score by 3 secs.   Baseline  RUE 42.84 secs, LUE 34.53 secs   Time 8   Period Weeks   Status New               Plan - 11-09-16 1100    Clinical Impression Statement Pt is progressing towards goals. He demonstrated improved tabletop scanning yet pt still remains limited by cognitive deficits including decreased awareness.   Rehab Potential Fair   Current Impairments/barriers affecting progress: has hired caregiver 10 hrs per week   OT Frequency 2x / week   OT Duration 8 weeks   OT Treatment/Interventions Self-care/ADL training;Therapeutic exercise;Patient/family education;Neuromuscular education;Balance training;Therapeutic exercises;DME and/or AE instruction;Therapeutic activities;Cognitive remediation/compensation;Visual/perceptual remediation/compensation   Plan educate cargiver in ball ex/ PWR! supine, and PWR! rock modified quadraped at table- If no caregiver environmental scanning, functional use of LUE      Patient will benefit from skilled therapeutic intervention in order to improve the following deficits and impairments:  Decreased endurance, Impaired vision/preception, Decreased knowledge of precautions, Impaired perceived functional ability, Decreased activity tolerance, Decreased knowledge of use of DME, Decreased strength, Impaired sensation, Decreased balance, Decreased cognition, Decreased range of motion, Decreased coordination, Decreased safety awareness, Impaired UE functional use  Visit Diagnosis: Hemiplegia and hemiparesis following cerebral infarction affecting left non-dominant side (HCC)  Other symptoms and signs involving cognitive functions following cerebral infarction  Visuospatial deficit  Frontal lobe and executive function deficit  Other lack of coordination  Muscle weakness (generalized)  Other symptoms and signs involving the musculoskeletal system  Other symptoms and signs involving the nervous system      G-Codes - 09-Nov-2016 1626     Functional Assessment Tool Used (Outpatient only) Clinical judgement;,Pt is using microwave and performing laundry at home modified indepednently tabletop scanning grossly 75-80%, pt writes 3 sentences with 80-90% legibility    Functional Limitation Self care   Self Care Current Status (O1751) At least 20 percent but less than 40 percent impaired, limited or restricted   Self Care Goal Status (W2585) At least 1 percent but less than 20 percent impaired, limited or restricted    Occupational Therapy Progress Note  Dates of Reporting Period: 09/15/16 to 2016-11-09  Objective Reports of Subjective Statement: Pt is performing tabletop scanning with 75-80% accuracy (peg design)  Objective Measurements: see above  Goal Update: Pt is progressing towards remaining goals.  Plan: Continue OT to address unmet goals  Reason Skilled Services are Required: Pt can benefit from skilled occupational therapy to address: coordination, visual perceptual deficits, rigidity, bradykinesia, cognitive deficits in order to maximize safety and independence with ADLs/ IADLs. Problem List Patient Active Problem List   Diagnosis Date Noted  . 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 Gait disturbance, post-stroke 08/04/2016  . Hypertensive crisis   . PAF (paroxysmal atrial fibrillation) ([place])   . 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 Paroxysmal atrial fibrillation (HCC)   . 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 Acute ischemic stroke ([place])   . CVA (cerebral vascular accident) ([facility]) 07/10/2016  . Nonallopathic lesion of sacroiliac region 01/09/2013  . Greater trochanteric bursitis of left hip 07/09/2012  . Nonallopathic lesion of lumbosacral region 05/07/2012  . 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 Left hip pain 01/10/2012  . Rotator cuff syndrome of right shoulder 11/09/2011  . Bicipital tendinitis 11/09/2011  . Parkinson's  disease ([facility]) 11/09/2011  . Hypertension 11/09/2011    Kaena Santori 2016-11-09, 4:30 PM [name], OTR/L [phone] Phone: [phone]  4:30 PM 10/27/16 [facility] [address] [place], [place], [address] Phone: [phone]   Fax:  [phone]  Name: Eric Pope MRN: [id number] Date of Birth: 03-Jun-1940

## 2016-10-27 NOTE — Patient Instructions (Signed)
   Show Eric Pope your therapy schedule and you both can decide on the earliest date that he can attend with you.  Ask Eric Pope if there is another "Eric Pope" that he knows of, who might take over after he leaves for [place].

## 2016-10-27 NOTE — Therapy (Signed)
[facility] [address] [place], [place], [address] Phone: [phone]   Fax:  [phone]  Speech Language Pathology Treatment  Patient Details  Name: Eric Pope MRN: [id number] Date of Birth: June 06, 1940 Referring Provider: Dr. Alysia Penna  Encounter Date: 10/27/2016      End of Session - 10/27/16 1140    Visit Number 20   Number of Visits 34   Date for SLP Re-Evaluation 12/01/16   SLP Start Time 0945  pt 15 minutes late   SLP Stop Time  1017   SLP Time Calculation (min) 32 min   Activity Tolerance Patient tolerated treatment well      Past Medical History:  Diagnosis Date  . Chronic left shoulder pain   . Hypertension   . Kidney stones   . Lead poisoning   . Nephrolithiasis   . 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 Vision abnormalities     Past Surgical History:  Procedure Laterality Date  . CHOLECYSTECTOMY    . LITHOTRIPSY      There were no vitals filed for this visit.      Subjective Assessment - 10/27/16 0949    Subjective "I found out I can't get wifi outside of my apartment, for the [place]." (pt arrived 15 mintues late)               ADULT SLP TREATMENT - 10/27/16 1132      General Information   Behavior/Cognition Alert;Cooperative;Pleasant mood;Distractible     Treatment Provided   Treatment provided Cognitive-Linquistic     Cognitive-Linquistic Treatment   Treatment focused on Cognition   Skilled Treatment Discussion with pt re: skilled nursing facility vs. moving to live closer to family, due to challenges with living independently (i.e., late to therapy due to unable to get Melburn Popper in time for arrival at start time, not eating breakfast, etc. Discussed x1/week due to pt concern re: $ for [employer] (20 miles), and co-pay. Emergent awareness, sustained and selective attention targeted today with written simple deductive reasoning tasks. Pt req'd SLP cues on each task for emergent  awareness 60% of the time. On second task, sustained attention/selective attention deficits appeared more significant as pt stopped working with one blank remaining on the task and SLP had to cue pt back to work after 45 seconds of pt sitting idle.     Assessment / Recommendations / Plan   Plan Continue with current plan of care     Progression Toward Goals   Progression toward goals Progressing toward goals          SLP Education - 10/27/16 1138    Education provided Yes   Education Details think/talk about whether a nursing home or change of residence would be in patient's best interest given his difficulty with transportation and challenges with finances and with independent living   Person(s) Educated Patient   Methods Explanation   Comprehension Verbalized understanding;Need further instruction          SLP Short Term Goals - 10/12/16 1209      SLP SHORT TERM GOAL #1   Title Pt will verbalize 3 cognitive impairment with occasional min A over 3 sessions.    Status Not Met     SLP SHORT TERM GOAL #2   Title Pt will solve simple functional reasoning, organization and attention to detail problems with 85% accuracy and occasional min A   Status Not Met     SLP SHORT TERM GOAL #  3   Title Pt will demo selective attention for mod complex task for 5 minutes in quiet environment with rare min A back to task   Status Not Met          SLP Long Term Goals - 10/27/16 1142      SLP LONG TERM GOAL #1   Title Pt will ID errors on cognitive linguistic tasks with occasional min A   Baseline renewed 10/05/16   Time 6   Period Weeks   Status On-going     SLP LONG TERM GOAL #2   Title Pt will solve simple math, reasoning and organization tasks with 80% accuracy and occasional min A   Baseline renewed 10/05/16   Time 6   Period Weeks   Status On-going     SLP LONG TERM GOAL #3   Title Pt will attend to 8 minute simple tasks with occasional min A, resulting in at least 90%  success   Baseline re-newed 10/05/16 - pt attending to simple tasks with 75% to 80% success   Time 6   Period Weeks   Status On-going     SLP LONG TERM GOAL #4   Title Pt will verbalize 3 cognitive impairment with occasional min A over 3 sessions.    Status Achieved     SLP LONG TERM GOAL #5   Title Pt will solve simple functional reasoning, organization and attention to detail problems with 85% accuracy and occasional min A   Baseline renewed 10/05/16   Time 6   Period Weeks   Status On-going     SLP LONG TERM GOAL #6   Title Pt will demo selective attention for mod complex task for 5 minutes in quiet environment with rare min A back to task   Status Achieved     SLP LONG TERM GOAL #7   Title Pt will utilize notebook or phone to manage follow ups, phone calls, physician appointments with occasional min A over 2 sessions   Baseline recert 08/22/45   Time 6   Period Weeks   Status On-going          Plan - 10/27/16 1140    Clinical Impression Statement Emergent awaress/error awareness req'd SLP assistance today, and sustained/selective attention wer seen as problematic after 10 minutes of simple focused cognitive tasks. Internal distraction cont to negatively affect pt overall performance. Continue skilled ST to maximize cognition for safety, decision making for indpendent living, and QOL.    Speech Therapy Frequency 2x / week   Duration --  8 weeks, pending progress   Treatment/Interventions Compensatory strategies;Functional tasks;Cognitive reorganization;Internal/external aids;SLP instruction and feedback;Patient/family education   Potential to Achieve Goals Fair   Potential Considerations Ability to learn/carryover information;Family/community support;Previous level of function;Cooperation/participation level;Severity of impairments      Patient will benefit from skilled therapeutic intervention in order to improve the following deficits and impairments:   Cognitive  communication deficit    Problem List Patient Active Problem List   Diagnosis Date Noted  . 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 Gait disturbance, post-stroke 08/04/2016  . Hypertensive crisis   . PAF (paroxysmal atrial fibrillation) ([place])   . Right middle cerebral artery stroke ([facility]) 07/14/2016  . 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 Paroxysmal atrial fibrillation (HCC)   . 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 Acute ischemic stroke ([facility])   . CVA (cerebral vascular accident) ([place] [facility]) 07/10/2016  . Nonallopathic lesion of sacroiliac region 01/09/2013  .  Greater trochanteric bursitis of left hip 07/09/2012  . Nonallopathic lesion of lumbosacral region 05/07/2012  . 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 Left hip pain 01/10/2012  . Rotator cuff syndrome of right shoulder 11/09/2011  . Bicipital tendinitis 11/09/2011  . Parkinson's disease ([place]) 11/09/2011  . Hypertension 11/09/2011    [facility] ,[place], [place]  10/27/2016, 3:14 PM  [facility] [address] [place], [place], [address] Phone: [phone]   Fax:  [phone]   Name: Javonta Gronau MRN: [id number] Date of Birth: 1940/10/21

## 2016-10-30 ENCOUNTER — Ambulatory Visit: Payer: Medicare Other

## 2016-10-30 ENCOUNTER — Ambulatory Visit: Payer: Medicare Other | Admitting: Occupational Therapy

## 2016-10-30 ENCOUNTER — Ambulatory Visit: Payer: Medicare Other | Admitting: Physical Therapy

## 2016-10-30 DIAGNOSIS — R278 Other lack of coordination: Secondary | ICD-10-CM

## 2016-10-30 DIAGNOSIS — R2689 Other abnormalities of gait and mobility: Secondary | ICD-10-CM

## 2016-10-30 DIAGNOSIS — I69354 Hemiplegia and hemiparesis following cerebral infarction affecting left non-dominant side: Secondary | ICD-10-CM | POA: Diagnosis not present

## 2016-10-30 DIAGNOSIS — I69318 Other symptoms and signs involving cognitive functions following cerebral infarction: Secondary | ICD-10-CM

## 2016-10-30 DIAGNOSIS — R41841 Cognitive communication deficit: Secondary | ICD-10-CM

## 2016-10-30 DIAGNOSIS — R29818 Other symptoms and signs involving the nervous system: Secondary | ICD-10-CM

## 2016-10-30 DIAGNOSIS — H53462 Homonymous bilateral field defects, left side: Secondary | ICD-10-CM

## 2016-10-30 DIAGNOSIS — M6281 Muscle weakness (generalized): Secondary | ICD-10-CM

## 2016-10-30 DIAGNOSIS — R41842 Visuospatial deficit: Secondary | ICD-10-CM

## 2016-10-30 DIAGNOSIS — R41844 Frontal lobe and executive function deficit: Secondary | ICD-10-CM

## 2016-10-30 DIAGNOSIS — R29898 Other symptoms and signs involving the musculoskeletal system: Secondary | ICD-10-CM

## 2016-10-30 NOTE — Therapy (Signed)
[facility] [address] [place], [place], [address] Phone: [phone]   Fax:  [phone]  Occupational Therapy Treatment  Patient Details  Name: Eric Pope MRN: [id number] Date of Birth: [birth year] Referring Provider: Dr Alysia Penna  Encounter Date: 10/30/2016      OT End of Session - 10/30/16 1705    Visit Number 21   Number of Visits 31   Date for OT Re-Evaluation 12/01/16   Authorization Type [employer] Medicare G Code    Authorization Time Period 10/03/16-12/01/16   Authorization - Visit Number 21   Authorization - Number of Visits 30   OT Start Time 6433   OT Stop Time 1445   OT Time Calculation (min) 40 min   Activity Tolerance Patient tolerated treatment well   Behavior During Therapy [facility] for tasks assessed/performed      Past Medical History:  Diagnosis Date  . Chronic left shoulder pain   . Hypertension   . Kidney stones   . Lead poisoning   . Nephrolithiasis   . 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 Vision abnormalities     Past Surgical History:  Procedure Laterality Date  . CHOLECYSTECTOMY    . LITHOTRIPSY      There were no vitals filed for this visit.      Subjective Assessment - 10/30/16 1703    Subjective  Pt reports that he forgets his medication at times   Patient is accompained by: --  hired caregiver   Patient Stated Goals Pt goals = Improved cognition, visual perception.   Currently in Pain? Yes   Pain Score 6    Pain Location Shoulder   Pain Orientation Right   Pain Descriptors / Indicators Aching   Pain Type Chronic pain   Pain Onset More than a month ago   Pain Frequency Constant   Aggravating Factors  high range   Pain Relieving Factors rest       Reviewed ball HEP in sitting and supine and PWR! Moves (up, rock, twist) in supine with pt/caregiver.  Pt needed min-mod cueing at times for impulsivity and technique which caregiver was able to provide.  Reviewed  environmental/visual scanning activities with pt/caregiver (looking for items in grocery store, puzzles, word searches, placing cards around home, etc.).  Also recommended card/board games for visual scanning/cognition.  Pt/caregiver verbalized understanding.  Reviewed recommendation for avoiding lifting weights with UEs due to R shoulder pain, poor technique/risk for injury and to avoid vigorous exercise due to a-fib (HR to stay under 70).  Pt/caregiver verbalized understanding.  Also discussed cognitive compensation strategies including using microwave/rice cooker vs. Stovetop, using daily/weekly schedule, phone alarms to take medications (or use medication organizer/watch with alarm).  Pt/caregiver verbalized understanding.                         OT Short Term Goals - 10/27/16 1057      OT SHORT TERM GOAL #2   Title Pt will be Mod I memory strategies and implement during OT sessions   Time 4   Period Weeks   Status On-going     OT SHORT TERM GOAL #3   Title Pt will be Mod I simple snack/meal prep adhering to safety and attending to left side with min v.c.- revised goal, Pt will perform basic home management tasks such as laundry, light cleaning or microwave meal prep at a mod I level, demonstrating good safety awareness and  attention to left side.   Status On-going  partially met- pt reports performing at home, therapist to further assess in clinic     OT [place] #4   Title Pt will perform mod complex tabletop scanning activities with  95% or better accuracy.- Revised Pt will perform mod complex tabletop scanning activities with 90% accuracy.   Time 4   Period Weeks   Status On-going  grossly 75-80% today     OT SHORT TERM GOAL #5   Title Pt will verbalize understanding of compensatory strategies for visual impairment.   Status Achieved     OT SHORT TERM GOAL #6   Status Achieved     OT SHORT TERM GOAL #7   Title Pt will be modified independent with  updated HEP.   Time 4   Period Weeks   Status On-going     OT SHORT TERM GOAL #8   Title Pt will demonstrate ability to write a short 3 sentence paragraph with 100% legibility and no significant decrease in letter size.   Time 4   Period Weeks   Status On-going           OT Long Term Goals - 10/03/16 1223      OT LONG TERM GOAL #1   Title Pt will navigate a busy environment and perform environmental scanning with 90% or better accuracy.   Time 8   Period Weeks   Status Partially Met  met for basic scanning without significant distractions- partially met     OT LONG TERM GOAL #2   Title Pt will be Mod I higher level cognitive tasks related to finances (bill pay and balancing a check book).   Time 8   Period Weeks   Status Deferred  Pt's brother is handling finances, deferred to [place] #3   Title Pt will demonstrate improved coordination on bilaterally  as seen by improved 9 Hold Peg score by 10 seconds in prep for functional use.   Baseline 07/25/16 = Left 49.03 seconds affected side (R = 108.84 seconds)    Time 8   Period Weeks   Status Achieved  09/26/16:  R-57.93sec, L-38.97sec     OT LONG TERM GOAL #4   Title Pt will be Mod I simple meal prep (fry and egg, make coffee) on stovetop while maintaining safety    Time 8   Period Weeks   Status Not Met  supervison recommended     OT LONG TERM GOAL #5   Title Pt will demonstrate ability to retrieve a lightweight object at 85* shoulder flexion with -10 elbow extension with his RUE.   Time 8   Period Weeks   Status Achieved  09/26/16:  shoulder flex 105*, elbow ext -30* (improved elbow ext at lower ROM)     OT LONG TERM GOAL #6   Title Pt will demonstrate ability to perform a physical and cognitive task simultaneously with 85% or better accuracy.   Time 8   Period Weeks   Status On-going  not met, pt is unable to multi task consistently     OT LONG TERM GOAL #7   Title Pt will perform mod complex  environmental scanning in a busy environment with 90% or better accuracy.   Time 8   Period Weeks   Status New     OT LONG TERM GOAL #8   Title Pt will demonstrate improved fine motor coordination for ADLs  as evidenced by decreasing bilateral 9 hole peg test score by 3 secs.   Baseline RUE 42.84 secs, LUE 34.53 secs   Time 8   Period Weeks   Status New               Plan - 10/30/16 1706    Clinical Impression Statement Pt is progressing towards goals but cognitive deficits and decr awareness remain barriers.  Caregiver verbalized understanding of education provided.   Rehab Potential Fair   Current Impairments/barriers affecting progress: has hired caregiver 10 hrs per week   OT Frequency 2x / week   OT Duration 8 weeks   OT Treatment/Interventions Self-care/ADL training;Therapeutic exercise;Patient/family education;Neuromuscular education;Balance training;Therapeutic exercises;DME and/or AE instruction;Therapeutic activities;Cognitive remediation/compensation;Visual/perceptual remediation/compensation   Plan continue with caregiver education in caregiver comes in, continue to address remaining goals   OT [place]! moves in supine, shoulder flex with ball in sitting & supine   Consulted and Agree with Plan of Care Patient      Patient will benefit from skilled therapeutic intervention in order to improve the following deficits and impairments:  Decreased endurance, Impaired vision/preception, Decreased knowledge of precautions, Impaired perceived functional ability, Decreased activity tolerance, Decreased knowledge of use of DME, Decreased strength, Impaired sensation, Decreased balance, Decreased cognition, Decreased range of motion, Decreased coordination, Decreased safety awareness, Impaired UE functional use  Visit Diagnosis: Hemiplegia and hemiparesis following cerebral infarction affecting left non-dominant side (HCC)  Other symptoms and signs involving  cognitive functions following cerebral infarction  Visuospatial deficit  Frontal lobe and executive function deficit  Other lack of coordination  Muscle weakness (generalized)  Other symptoms and signs involving the musculoskeletal system  Other symptoms and signs involving the nervous system  Other abnormalities of gait and mobility  Homonymous bilateral field defects, left side    Problem List Patient Active Problem List   Diagnosis Date Noted  . 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 Gait disturbance, post-stroke 08/04/2016  . Hypertensive crisis   . PAF (paroxysmal atrial fibrillation) ([place])   . 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 Paroxysmal atrial fibrillation (HCC)   . 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 Acute ischemic stroke ([place])   . CVA (cerebral vascular accident) ([place]) 07/10/2016  . Nonallopathic lesion of sacroiliac region 01/09/2013  . Greater trochanteric bursitis of left hip 07/09/2012  . Nonallopathic lesion of lumbosacral region 05/07/2012  . 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 Left hip pain 01/10/2012  . Rotator cuff syndrome of right shoulder 11/09/2011  . Bicipital tendinitis 11/09/2011  . Parkinson's disease ([place]) 11/09/2011  . Hypertension 11/09/2011    [facility] 10/30/2016, 5:17 PM  [facility] [address] [place], [place], [address] Phone: [phone]   Fax:  [phone]  Name: Eric Pope MRN: [id number] Date of Birth: [birth year]   Vianne Bulls, OTR/L [facility] [address] [place], [place]  [address] [phone] phone [phone] 10/30/16 5:22 PM

## 2016-10-30 NOTE — Therapy (Signed)
[facility] [address] [place], [place], [address] Phone: [phone]   Fax:  [phone]  Speech Language Pathology Treatment  Patient Details  Name: Eric Pope MRN: [id number] Date of Birth: 10/26/1940 Referring Provider: Dr. Alysia Penna  Encounter Date: 10/30/2016      End of Session - 10/30/16 1736    Visit Number 21   Number of Visits 34   Date for SLP Re-Evaluation 12/01/16   SLP Start Time 1148   SLP Stop Time  1234   SLP Time Calculation (min) 46 min   Activity Tolerance Patient tolerated treatment well      Past Medical History:  Diagnosis Date  . Chronic left shoulder pain   . Hypertension   . Kidney stones   . Lead poisoning   . Nephrolithiasis   . 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 Vision abnormalities     Past Surgical History:  Procedure Laterality Date  . CHOLECYSTECTOMY    . LITHOTRIPSY      There were no vitals filed for this visit.      Subjective Assessment - 10/30/16 1152    Subjective "               ADULT SLP TREATMENT - 10/30/16 1728      General Information   Behavior/Cognition Alert;Cooperative;Pleasant mood;Confused;Impulsive;Distractible     Treatment Provided   Treatment provided Cognitive-Linquistic     Cognitive-Linquistic Treatment   Treatment focused on Patient/family/caregiver education;Cognition   Skilled Treatment Given Garett's presence today SLP focused on pt's awareness of deficits/anticipatory awareness for after Garett leaves 11-19-16. SLP stressed to pt that 24/7 caregiver at this time would very likely be best for pt. SLP learned that Ladonna Snide is texting pt to remind him to eat and take his meds. And that pt/Garett have time for primarily errand-running and very little time for formal ST and OT activities.  SLP repeated explanation that although pt has shown some minimal progress in awareness and attention, these skills are NOT in a  functional nor normal range. Caregiver and SLP learned of opportunity pt shared with them re: living with a family in [place] encouraged pt to talk with brother about this for living situation after 11-19-16.     Assessment / Recommendations / Plan   Plan --  pt cont to think x1/week good due to financial concerns     Progression Toward Goals   Progression toward goals Progressing toward goals          SLP Education - 10/30/16 1734    Education provided Yes   Education Details progress is there but not at functional nor normal level yet, best case 24/7 care due to cont'd cueing for basic non-hygenic needs (meals, meds)   Person(s) Educated Patient;Caregiver(s)   Methods Explanation   Comprehension Verbalized understanding;Need further instruction;Verbal cues required          SLP Short Term Goals - 10/12/16 1209      SLP SHORT TERM GOAL #1   Title Pt will verbalize 3 cognitive impairment with occasional min A over 3 sessions.    Status Not Met     SLP SHORT TERM GOAL #2   Title Pt will solve simple functional reasoning, organization and attention to detail problems with 85% accuracy and occasional min A   Status Not Met     SLP SHORT TERM GOAL #3   Title Pt will demo selective attention for mod complex  task for 5 minutes in quiet environment with rare min A back to task   Status Not Met          SLP Long Term Goals - 10/30/16 1738      SLP LONG TERM GOAL #1   Title Pt will ID errors on cognitive linguistic tasks with occasional min A   Baseline renewed 10/05/16   Time 5   Period Weeks   Status On-going     SLP LONG TERM GOAL #2   Title Pt will solve simple math, reasoning and organization tasks with 80% accuracy and occasional min A   Baseline renewed 10/05/16   Time 5   Period Weeks   Status On-going     SLP LONG TERM GOAL #3   Title Pt will attend to 8 minute simple tasks with occasional min A, resulting in at least 90% success   Baseline re-newed  10/05/16 - pt attending to simple tasks with 75% to 80% success   Time 5   Period Weeks   Status On-going     SLP LONG TERM GOAL #4   Title Pt will verbalize 3 cognitive impairment with occasional min A over 3 sessions.    Status Achieved     SLP LONG TERM GOAL #5   Title Pt will solve simple functional reasoning, organization and attention to detail problems with 85% accuracy and occasional min A   Baseline renewed 10/05/16   Time 5   Period Weeks   Status On-going     SLP LONG TERM GOAL #6   Title Pt will demo selective attention for mod complex task for 5 minutes in quiet environment with rare min A back to task   Status Achieved     SLP LONG TERM GOAL #7   Title Pt will utilize notebook or phone to manage follow ups, phone calls, physician appointments with occasional min A over 2 sessions   Baseline recert 10/05/16   Time 5   Period Weeks   Status On-going          Plan - 10/30/16 1736    Clinical Impression Statement See "skilled intervention" for details of today's session. Pt would still benefit from skilled ST but needs caregiver here for consistency in home environment as pt is unable to do this spontaneously and requires cues for carryover.   Speech Therapy Frequency 1x /week   Duration --  8 weeks, pending progress   Treatment/Interventions Compensatory strategies;Functional tasks;Cognitive reorganization;Internal/external aids;SLP instruction and feedback;Patient/family education   Potential to Achieve Goals Fair   Potential Considerations Ability to learn/carryover information;Family/community support;Previous level of function;Cooperation/participation level;Severity of impairments   Consulted and Agree with Plan of Care Patient      Patient will benefit from skilled therapeutic intervention in order to improve the following deficits and impairments:   Cognitive communication deficit    Problem List Patient Active Problem List   Diagnosis Date Noted  .  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 Gait disturbance, post-stroke 08/04/2016  . Hypertensive crisis   . PAF (paroxysmal atrial fibrillation) (HCC)   . 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 Paroxysmal atrial fibrillation (HCC)   . 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 Acute ischemic stroke (HCC)   . CVA (cerebral vascular accident) (HCC) 07/10/2016  . Nonallopathic lesion of sacroiliac region 01/09/2013  . Greater trochanteric bursitis of left hip 07/09/2012  . Nonallopathic lesion of  lumbosacral region 05/07/2012  . 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 Left hip pain 01/10/2012  . Rotator cuff syndrome of right shoulder 11/09/2011  . Bicipital tendinitis 11/09/2011  . Parkinson's disease ([place]) 11/09/2011  . Hypertension 11/09/2011    [facility] ,[place], [place]  10/30/2016, 5:40 PM  [facility] [address] [place], [place], [address] Phone: [phone]   Fax:  [phone]   Name: Eric Pope MRN: [id number] Date of Birth: 1940/08/07

## 2016-10-30 NOTE — Patient Instructions (Signed)
Call [facility] after therapy and talk re: living with the family in [place], as well as SCAT and meals on wheels.

## 2016-10-31 NOTE — Progress Notes (Signed)
Patient Care Team: Vladimir Creeks, MD as PCP - General (Vascular Surgery)   HPI  Eric Pope is a 76 y.o. male Seen because of persistent atrial fibrillation for which he was started on flecainide with reversion to sinus but now again in AFib He was here for his GXT on 1C therapy, but with his tremor I dont think it is doable now or ever Will also stop the flec and try propafneone Also complaining of fatigue which maybe from diltiazem      Past Medical History:  Diagnosis Date  . Chronic left shoulder pain   . Hypertension   . Kidney stones   . Lead poisoning   . Nephrolithiasis   . 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 Vision abnormalities     Past Surgical History:  Procedure Laterality Date  . CHOLECYSTECTOMY    . LITHOTRIPSY      Current Outpatient Prescriptions  Medication Sig Dispense Refill  . Carbidopa-Levodopa (RYTARY PO) Take by mouth 3 (three) times daily.    . dabigatran (PRADAXA) 150 MG CAPS capsule Take 1 capsule (150 mg total) by mouth 2 (two) times daily. 60 capsule 6  . folic acid (FOLVITE) 1 MG tablet Take 1 tablet (1 mg total) by mouth daily. 30 tablet 0  . hydrochlorothiazide (MICROZIDE) 12.5 MG capsule Take 1 capsule (12.5 mg total) by mouth daily as needed (for blood pressure). 30 capsule 1  . Melatonin 3 MG TABS Take 2 tablets (6 mg total) by mouth at bedtime. 30 tablet 0  . metoprolol succinate (TOPROL XL) 25 MG 24 hr tablet Take 0.5 tablets (12.5 mg total) by mouth daily. 30 tablet 1  . NON FORMULARY Vitamin C powder mixed with sodium bicarb: Drink daily as directed    . OVER THE COUNTER MEDICATION Hemp oil: One squirt under the tongue twice a day to treat tremors    . propafenone (RYTHMOL) 225 MG tablet Take 1 tablet (225 mg total) by mouth 2 (two) times daily. 60 tablet 6  . spironolactone (ALDACTONE) 25 MG tablet Take 12.5 mg by mouth daily.    Marland Kitchen telmisartan (MICARDIS) 80 MG tablet Take 80 mg by mouth daily.    . verapamil  (CALAN-SR) 120 MG CR tablet Take 1 tablet (120 mg total) by mouth daily. 30 tablet 6   No current facility-administered medications for this visit.     Allergies  Allergen Reactions  . Fish-Derived Products Other (See Comments)    Mercury content poisoned his body  . Kale Other (See Comments)    Caused toxicity in the body  . Milk-Related Compounds Other (See Comments)    Increases phlegm  . Ciprofloxacin Other (See Comments)    Causes headaches  . Latex Itching      Review of Systems negative except from HPI and PMH  Physical Exam Recorded on stress report  Well developed and well nourished in no acute distress HENT normal E scleral and icterus clear Neck Supple Clear to ausculation Irregular rate and rhythm, no murmurs gallops or rub Soft with active bowel sounds No clubbing cyanosis {Edema Alert and oriented, grossly normal motor and sensory function significant tremor Skin Warm and Dry  ECG  Atrial fib  Assessment and  Plan Atrial fib with RVR  w  Will begin propafenone 225 BID and stop flecainide And start verapamil and stop dilt       Current medicines are reviewed at length with the patient today .  The patient does   have concerns regarding medicines As above

## 2016-11-01 ENCOUNTER — Ambulatory Visit: Payer: Medicare Other | Admitting: Physical Therapy

## 2016-11-01 ENCOUNTER — Encounter: Payer: Medicare Other | Admitting: Speech Pathology

## 2016-11-01 ENCOUNTER — Encounter: Payer: Medicare Other | Admitting: Occupational Therapy

## 2016-11-02 ENCOUNTER — Other Ambulatory Visit: Payer: Self-pay

## 2016-11-02 ENCOUNTER — Encounter (HOSPITAL_COMMUNITY): Payer: Self-pay | Admitting: Nurse Practitioner

## 2016-11-02 ENCOUNTER — Ambulatory Visit (HOSPITAL_COMMUNITY)
Admission: RE | Admit: 2016-11-02 | Discharge: 2016-11-02 | Disposition: A | Discharge status: Home / Self Care | Payer: Medicare Other | Source: Ambulatory Visit | Attending: Nurse Practitioner | Admitting: Nurse Practitioner

## 2016-11-02 VITALS — BP 134/84 | HR 66 | Ht 68.0 in | Wt 190.6 lb

## 2016-11-02 DIAGNOSIS — Z9889 Other specified postprocedural states: Secondary | ICD-10-CM | POA: Insufficient documentation

## 2016-11-02 DIAGNOSIS — Z7901 Long term (current) use of anticoagulants: Secondary | ICD-10-CM | POA: Insufficient documentation

## 2016-11-02 DIAGNOSIS — I10 Essential (primary) hypertension: Secondary | ICD-10-CM | POA: Insufficient documentation

## 2016-11-02 DIAGNOSIS — I4892 Unspecified atrial flutter: Secondary | ICD-10-CM | POA: Insufficient documentation

## 2016-11-02 DIAGNOSIS — Z8673 Personal history of transient ischemic attack (TIA), and cerebral infarction without residual deficits: Secondary | ICD-10-CM | POA: Diagnosis not present

## 2016-11-02 DIAGNOSIS — Z79899 Other long term (current) drug therapy: Secondary | ICD-10-CM | POA: Diagnosis not present

## 2016-11-02 DIAGNOSIS — I480 Paroxysmal atrial fibrillation: Secondary | ICD-10-CM | POA: Diagnosis not present

## 2016-11-02 DIAGNOSIS — Z833 Family history of diabetes mellitus: Secondary | ICD-10-CM | POA: Insufficient documentation

## 2016-11-02 DIAGNOSIS — Z8249 Family history of ischemic heart disease and other diseases of the circulatory system: Secondary | ICD-10-CM | POA: Diagnosis not present

## 2016-11-02 DIAGNOSIS — Z888 Allergy status to other drugs, medicaments and biological substances status: Secondary | ICD-10-CM | POA: Insufficient documentation

## 2016-11-02 DIAGNOSIS — Z9049 Acquired absence of other specified parts of digestive tract: Secondary | ICD-10-CM | POA: Insufficient documentation

## 2016-11-02 MED ORDER — NEBIVOLOL HCL 5 MG PO TABS: 2.5000 mg | ORAL_TABLET | Freq: Every day | ORAL | Status: DC

## 2016-11-02 NOTE — Progress Notes (Signed)
Primary Care Physician: Vladimir Creeks, MD Referring Physician: Dr. Caryl Comes Neurology:Dr. Lowry Bala is a 76 y.o. male with a h/o afib and prior stroke that asked to be seen in the afib clinic after V/S showed an increase and irregularity of heart rate on the 18 th of April. EKG shows aflutter with variable AV block.Per pt he is not symptomatic. He has refused anticoagulation in the past for desire to use a "natural" blood thinner. His heart rate is not that high in the 90's but could benefit from rate control as he is  currently not on any rate control drugs. Pt denies any alcohol, tobacco or excessive caffeine.  F/u 5/1. Ekg,despite heavy artifact from tremors, shows identifiable P waves. Pt states that he thinks he went back to Normal rhythm several days ago. His heart rate and BP att home have been normal. He was started on metoprolol at last visit as well as eliquis and is tolerating both.  F/u in afib clinic 5/14. His heart rate was found to be in the 140's while trying to exercise on the treadmill in rehab, but with his tremors, it is hard to sometimes difficult to get a good baseline because of  Artifact. It was suggested that he have a ETT to help guide with his rehab. In the clinic today, he is in [place] at 66 bpm. He has not noted any sustained afib since I last saw him last.   F/u in afib clinic 6/14, he is in [place], again with heavy artifact from tremors. He was changed to propafenone from flecainide by Dr. Caryl Comes as he was having breakthrough afib on flecainde. He has not noted any afib by pulse ox as he can not  feel it. He continues on anticoagulation but has noted some bruising on his hands, reassured. 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  Today, he denies symptoms of palpitations, chest pain, shortness of breath, orthopnea, PND, lower extremity edema, dizziness,  presyncope, syncope, or neurologic sequela. The patient is tolerating medications without difficulties and is otherwise without complaint today.   Past Medical History:  Diagnosis Date  . Chronic left shoulder pain   . Hypertension   . Kidney stones   . Lead poisoning   . Nephrolithiasis   . 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 Vision abnormalities    Past Surgical History:  Procedure Laterality Date  . CHOLECYSTECTOMY    . LITHOTRIPSY      Current Outpatient Prescriptions  Medication Sig Dispense Refill  . Carbidopa-Levodopa (RYTARY PO) Take by mouth 3 (three) times daily.    . dabigatran (PRADAXA) 150 MG CAPS capsule Take 1 capsule (150 mg total) by mouth 2 (two) times daily. 60 capsule 6  . folic acid (FOLVITE) 1 MG tablet Take 1 tablet (1 mg total) by mouth daily. 30 tablet 0  . hydrochlorothiazide (MICROZIDE) 12.5 MG capsule Take 1 capsule (12.5 mg total) by mouth daily as needed (for blood pressure). 30 capsule 1  . Melatonin 3 MG TABS Take 2 tablets (6 mg total) by mouth at bedtime. 30 tablet 0  . NON FORMULARY Vitamin C powder mixed with sodium bicarb: Drink daily as directed    . OVER THE COUNTER MEDICATION Hemp oil: One squirt under the tongue twice a day to treat tremors    . propafenone (RYTHMOL) 225 MG tablet Take 1  tablet (225 mg total) by mouth 2 (two) times daily. 60 tablet 6  . spironolactone (ALDACTONE) 25 MG tablet Take 12.5 mg by mouth daily.    Marland Kitchen telmisartan (MICARDIS) 80 MG tablet Take 80 mg by mouth daily.    . verapamil (CALAN-SR) 120 MG CR tablet Take 1 tablet (120 mg total) by mouth daily. 30 tablet 6  . nebivolol (BYSTOLIC) 5 MG tablet Take 0.5 tablets (2.5 mg total) by mouth daily. 30 tablet    No current facility-administered medications for this encounter.     Allergies  Allergen Reactions  . Fish-Derived Products Other (See Comments)    Mercury content poisoned his body  . Kale Other (See Comments)    Caused toxicity in the body  . Milk-Related  Compounds Other (See Comments)    Increases phlegm  . Ciprofloxacin Other (See Comments)    Causes headaches  . Latex Itching    Social History   Social History  . Marital status: Divorced    Spouse name: N/A  . Number of children: N/A  . Years of education: N/A   Occupational History  . Not on file.   Social History Main Topics  . Smoking status: Never Smoker  . Smokeless tobacco: Never Used  . Alcohol use Yes     Comment: wine occasionally  . Drug use: No  . Sexual activity: Not on file   Other Topics Concern  . Not on file   Social History Narrative  . No narrative on file    Family History  Problem Relation Age of Onset  . Heart disease Brother   . Diabetes Brother   . Diabetes Maternal Grandmother   . Heart attack Maternal Grandfather   . Heart attack Paternal Grandfather     ROS- All systems are reviewed and negative except as per the HPI above  Physical Exam: Vitals:   11/02/16 1451  BP: 134/84  Pulse: 66  Weight: 190 lb 9.6 oz (86.5 kg)  Height: 5\' 8"  (1.727 m)   Wt Readings from Last 3 Encounters:  11/02/16 190 lb 9.6 oz (86.5 kg)  10/14/16 184 lb (83.5 kg)  10/02/16 184 lb 3.2 oz (83.6 kg)    Labs: Lab Results  Component Value Date   NA 143 07/17/2016   K 4.0 07/17/2016   CL 110 07/17/2016   CO2 28 07/17/2016   GLUCOSE 99 07/17/2016   BUN 16 07/17/2016   CREATININE 0.99 07/17/2016   CALCIUM 9.3 07/17/2016   Lab Results  Component Value Date   INR 1.04 07/10/2016   Lab Results  Component Value Date   CHOL 197 07/11/2016   HDL 66 07/11/2016   LDLCALC 118 (H) 07/11/2016   TRIG 65 07/11/2016     GEN- The patient is well appearing, alert and oriented x 3 today.  Head- normocephalic, atraumatic Eyes-  Sclera clear, conjunctiva pink Ears- hearing intact Oropharynx- clear Neck- supple, no JVP Lymph- no cervical lymphadenopathy Lungs- Clear to ausculation bilaterally, normal work of breathing Heart-  regular rate and rhythm,  no murmurs, rubs or gallops, PMI not laterally displaced GI- soft, NT, ND, + BS Extremities- no clubbing, cyanosis, or edema, tremors of upper extremities MS- no significant deformity or atrophy Skin- no rash or lesion Psych- euthymic mood, full affect Neuro- strength and sensation are intact  EKG- heavy artifact from tremors but identifiable P waves  with SR at 66 bpm, qtc 314 Epic records reviewed    Assessment and Plan: 1. H/o afib/flutter  In SR today Continue propafenone 225 mg bid Continue  pradaxa for chadsvasc score of at least 4 Pt wants to change from metoprolol to bystolic 2/2 sexual dysfunction, he will use 2.5 mg qd  F/u with Dr. Caryl Comes in August   Butch Penny C. Julian Medina, [facility] [address] [place], [place] [address] [phone]

## 2016-11-02 NOTE — Therapy (Signed)
[facility] [address] [place], [place], [address] Phone: [phone]   Fax:  [phone]  Patient Details  Name: Eric Pope MRN: [id number] Date of Birth: 1940/11/15 Referring Provider:  No ref. provider found  Encounter Date: 11/02/2016  SLP and OT spoke with pt's POA and brother, Eric Pope via phone for 30 minutes, expressing concern for pt's current caregiver leaving out of country 11-19-16 and pt's need for supervision for basic needs.  Brother told SLP/OT that he and sister will be arriving to pack pt's belongings to move him out of his current living situation and likely into a more supportive environment (ALF) in the near future. Therapists expressed concerns regarding pt's ability for such basic needs as medication administration and adequate nutrition. Pt's brother understood and agreed that pt's last therapy visit would be on 11-16-16. Therapists conveyed pt may benefit from therapies in the future if living situation solidifies and caregiver support improves.  [name], OTR-L Price ,MS, CCC-SLP 11/02/2016, 4:11 PM  [facility] [address] [place], [place], [address] Phone: [phone]   Fax:  [phone]

## 2016-11-14 ENCOUNTER — Ambulatory Visit (HOSPITAL_BASED_OUTPATIENT_CLINIC_OR_DEPARTMENT_OTHER): Payer: Medicare Other | Admitting: Physical Medicine & Rehabilitation

## 2016-11-14 ENCOUNTER — Encounter: Payer: Medicare Other | Admitting: Occupational Therapy

## 2016-11-14 ENCOUNTER — Encounter: Payer: Self-pay | Admitting: Physical Medicine & Rehabilitation

## 2016-11-14 ENCOUNTER — Encounter: Payer: Medicare Other | Attending: Psychology

## 2016-11-14 VITALS — BP 119/75 | HR 59 | Resp 14

## 2016-11-14 DIAGNOSIS — I69398 Other sequelae of cerebral infarction: Secondary | ICD-10-CM

## 2016-11-14 DIAGNOSIS — R414 Neurologic neglect syndrome: Secondary | ICD-10-CM

## 2016-11-14 DIAGNOSIS — I69319 Unspecified symptoms and signs involving cognitive functions following cerebral infarction: Secondary | ICD-10-CM

## 2016-11-14 DIAGNOSIS — I63521 Cerebral infarction due to unspecified occlusion or stenosis of right anterior cerebral artery: Secondary | ICD-10-CM | POA: Diagnosis present

## 2016-11-14 DIAGNOSIS — Z5189 Encounter for other specified aftercare: Secondary | ICD-10-CM | POA: Diagnosis not present

## 2016-11-14 DIAGNOSIS — I4891 Unspecified atrial fibrillation: Secondary | ICD-10-CM | POA: Insufficient documentation

## 2016-11-14 DIAGNOSIS — R269 Unspecified abnormalities of gait and mobility: Secondary | ICD-10-CM | POA: Diagnosis not present

## 2016-11-14 DIAGNOSIS — G20 Parkinson's disease: Secondary | ICD-10-CM | POA: Diagnosis not present

## 2016-11-14 NOTE — Patient Instructions (Addendum)
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

## 2016-11-14 NOTE — Progress Notes (Signed)
Subjective:    Patient ID: Eric Pope, male    DOB: 03/14/1941, 76 y.o.   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 He has been started on new medications, Azilect, Psychology referral made on last visit Patient is complaining of some depressive symptoms. He is wondering whether this is normal.  Patient had many questions about his residual deficits. We discussed driving as well as recommendation not to drive until further evaluation.  Patient has had follow-up appointments with cardiology and with his vascular neurologist as well. Pain Inventory Average Pain 8 Pain Right Now 0 My pain is intermittent  In the last 24 hours, has pain interfered with the following? General activity 0 Relation with others 0 Enjoyment of life 0 What TIME of day is your pain at its worst? varies with movement Sleep (in general) Good  Pain is worse with: some activites Pain improves with: rest and therapy/exercise Relief from Meds: 0  Mobility walk without assistance how many minutes can you walk? 30  ability to climb steps?  yes do you drive?  no Do you have any goals in this area?  yes  Function not employed: date last employed . Do you have any goals in this area?  yes  Neuro/Psych weakness numbness tremor dizziness anxiety  Prior Studies Any changes since last visit?  no  Physicians involved in your care Any changes since last visit?  no   Family History  Problem Relation Age of Onset  . Heart disease Brother   . Diabetes Brother   . Diabetes Maternal Grandmother   . Heart attack Maternal Grandfather   . Heart attack Paternal Grandfather    Social History   Social History  . Marital status: Divorced    Spouse name: N/A  . Number of children: N/A  . Years  of education: N/A   Social History Main Topics  . Smoking status: Never Smoker  . Smokeless tobacco: Never Used  . Alcohol use Yes     Comment: wine occasionally  . Drug use: No  . Sexual activity: Not Asked   Other Topics Concern  . None   Social History Narrative  . None   Past Surgical History:  Procedure Laterality Date  . CHOLECYSTECTOMY    . LITHOTRIPSY     Past Medical History:  Diagnosis Date  . Chronic left shoulder pain   . Hypertension   . Kidney stones   . Lead poisoning   . Nephrolithiasis   . 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 Vision abnormalities    BP 119/75   Pulse (!) 59   Resp 14   SpO2 95%   Opioid Risk Score:   Fall Risk Score:  `1  Depression screen PHQ 2/9  Depression screen PHQ 2/9 11/14/2016  Decreased Interest 0  Down, Depressed, Hopeless 0  PHQ - 2 Score 0    Review of Systems  Constitutional: Positive for appetite change and unexpected weight change.  HENT: Negative.   Eyes: Negative.   Respiratory: Negative.   Cardiovascular: Negative.   Gastrointestinal: Negative.   Endocrine: Negative.   Genitourinary: Negative.   Musculoskeletal: Positive for arthralgias.  Skin: Negative.   Allergic/Immunologic: Negative.   Neurological: Positive for dizziness, tremors, weakness and numbness.  Hematological: Negative.   Psychiatric/Behavioral: The patient is nervous/anxious.  All other systems reviewed and are negative.      Objective:   Physical Exam  Constitutional: He is oriented to person, place, and time. He appears well-developed and well-nourished.  HENT:  Head: Normocephalic and atraumatic.  Eyes: Conjunctivae and EOM are normal. Pupils are equal, round, and reactive to light.  Neck: Normal range of motion.  Musculoskeletal:       Right shoulder: He exhibits decreased range of motion.  Positive impingement sign, right shoulder, no evidence of shoulder subluxation Decreased right shoulder internal/external rotation as  well as abduction  Neurological: He is alert and oriented to person, place, and time. He displays tremor. He displays no atrophy. Coordination and gait abnormal.  Bilateral pill-rolling tremor in the upper extremities. Unable to do tandem gait, is able to do standard gait without assistive device, mildly increased base of support. No evidence to drag Motor strength is 5/5 bilateral deltoid, biceps, triceps, grip, hip flexor, knee extensor, ankle dorsiflexor.   Psychiatric: He has a normal mood and affect.  Nursing note and vitals reviewed.         Assessment & Plan:  1. 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 In addition, he has mild residual balance deficits. He has cognitive deficits which can be more specifically identified by additional neuropsychologic testing.  Patient is holding off on additional neuropsych testing due to cost.  Patient has finished outpatient PT, OT and speech therapy. Home exercise program encouraged. Here with a caregiver that also encourages the patient to do his exercises.  Neurology follow-up for Parkinson's Vascular neurology follow-up for CVA Electrophysiology follow-up for atrial fibrillation  No driving without driving evaluation and okay from neurology  No physical medicine and rehabilitation follow-up recommended Over half of the 25 min visit was spent counseling and coordinating care.

## 2016-11-15 ENCOUNTER — Encounter: Payer: Self-pay | Admitting: Occupational Therapy

## 2016-11-15 NOTE — Therapy (Signed)
[facility] [address] [place], [place], [address] Phone: [phone]   Fax:  [phone]  Patient Details  Name: Eric Pope MRN: [id number] Date of Birth: Oct 05, 1940 Referring Provider:  No ref. provider found  Encounter Date: 11/15/2016   Therapist received a phone call from pt's sister [name], with pt's sister requesting to cancel pt's appointment tomorrow due to financial reasons. Therapist contacted the patient and patient gave verbal consent for therapist to speak with his sister regarding private medical information.  Therapist discussed with pt,  his sister's concerns her concerns regarding his financial situation. Pt agreed that he would like to cancel all remaining ST/OT visits and pt agrees with d/c from therapy at this time.  Therapist called pt's sister back. Pt's sister was made aware of pt's request to d/c all remaining visits and to d/c from therapy. Therapist recommend to pt's sister that pt would benefit from an ALF or increased  caregiver support for safety. Pt's sister was made aware that patient may benefit from additional therapy in the future if he has increased caregiver support.  Pt's sister was made aware that pt's progress has plateaued due to lack of awareness and caregiver support to provide carryover. Pt's sister verbalized understanding. RINE,KATHRYN 11/15/2016, 5:35 PM  [facility] [address] [place], [place], [address] Phone: [phone]   Fax:  [phone]

## 2016-11-16 ENCOUNTER — Ambulatory Visit: Payer: Medicare Other | Admitting: Speech Pathology

## 2016-11-16 ENCOUNTER — Ambulatory Visit: Payer: Medicare Other | Admitting: Occupational Therapy

## 2016-11-21 ENCOUNTER — Telehealth: Payer: Self-pay | Admitting: Internal Medicine

## 2016-11-21 ENCOUNTER — Encounter: Payer: Medicare Other | Admitting: Occupational Therapy

## 2016-11-21 NOTE — Telephone Encounter (Signed)
Follow up     Dr Dahlia Client needs to speak to you about his cardiac status for his driver license please call

## 2016-11-21 NOTE — Telephone Encounter (Signed)
I left a message for the patient to call. 

## 2016-11-21 NOTE — Telephone Encounter (Signed)
New message      Dr Dahlia Client request a callback from the nurse this afternoon.  Not sure what he want to discuss

## 2016-11-23 ENCOUNTER — Encounter: Payer: Medicare Other | Admitting: Occupational Therapy

## 2016-11-28 ENCOUNTER — Encounter: Payer: Medicare Other | Admitting: Occupational Therapy

## 2016-11-28 ENCOUNTER — Telehealth: Payer: Self-pay

## 2016-11-28 ENCOUNTER — Encounter: Payer: Self-pay | Admitting: Occupational Therapy

## 2016-11-28 NOTE — Telephone Encounter (Signed)
Received a fax from [facility] for Tier exception on Pradaxa. I have completed the form, Dr Caryl Comes signed the form and I have faxed it back to [facility]. Awaiting response.

## 2016-11-28 NOTE — Therapy (Signed)
[facility] [address] [place], [place], [address] Phone: [phone]   Fax:  [phone]  Patient Details  Name: Eric Pope MRN: [id number] Date of Birth: 1940/09/13 Referring Provider:  No ref. provider found  Encounter Date: 11/28/2016      OT Short Term Goals - 10/27/16 1057      OT SHORT TERM GOAL #2   Title Pt will be Mod I memory strategies and implement during OT sessions   Time 4   Period Weeks   Status Partially met, does not use consistently     OT SHORT TERM GOAL #3   Title Pt will be Mod I simple snack/meal prep adhering to safety and attending to left side with min v.c.- revised goal, Pt will perform basic home management tasks such as laundry, light cleaning or microwave meal prep at a mod I level, demonstrating good safety awareness and attention to left side.   Status  partially met- pt reports performing at home, therapist to further assess in clinic     OT [place] #4   Title Pt will perform mod complex tabletop scanning activities with  95% or better accuracy.- Revised Pt will perform mod complex tabletop scanning activities with 90% accuracy.   Time 4   Period Weeks   Status Not met grossly 75-80% today     OT SHORT TERM GOAL #5   Title Pt will verbalize understanding of compensatory strategies for visual impairment.   Status Achieved     OT SHORT TERM GOAL #6   Status Achieved     OT SHORT TERM GOAL #7   Title Pt will be modified independent with updated HEP.   Time 4   Period Weeks   Status met     OT SHORT TERM GOAL #8   Title Pt will demonstrate ability to write a short 3 sentence paragraph with 100% legibility and no significant decrease in letter size.   Time 4   Period Weeks   Status Not tested         OT Long Term Goals - 10/03/16 1223      OT LONG TERM GOAL #1   Title Pt will navigate a busy environment and perform environmental scanning with 90% or  better accuracy.   Time 8   Period Weeks   Status Partially Met  met for basic scanning without significant distractions- partially met     OT LONG TERM GOAL #2   Title Pt will be Mod I higher level cognitive tasks related to finances (bill pay and balancing a check book).   Time 8   Period Weeks   Status Deferred  Pt's brother is handling finances, deferred to Eric Pope #3   Title Pt will demonstrate improved coordination on bilaterally  as seen by improved 9 Hold Peg score by 10 seconds in prep for functional use.   Baseline 07/25/16 = Left 49.03 seconds affected side (R = 108.84 seconds)    Time 8   Period Weeks   Status Achieved  09/26/16:  R-57.93sec, L-38.97sec     OT LONG TERM GOAL #4   Title Pt will be Mod I simple meal prep (fry and egg, make coffee) on stovetop while maintaining safety    Time 8   Period Weeks   Status Not Met  supervison recommended     OT LONG TERM GOAL #5   Title Pt will demonstrate ability  to retrieve a lightweight object at 85* shoulder flexion with -10 elbow extension with his RUE.   Time 8   Period Weeks   Status Achieved  09/26/16:  shoulder flex 105*, elbow ext -30* (improved elbow ext at lower ROM)     OT LONG TERM GOAL #6   Title Pt will demonstrate ability to perform a physical and cognitive task simultaneously with 85% or better accuracy.   Time 8   Period Weeks   Status   not met, pt is unable to multi task consistently     OT LONG TERM GOAL #7   Title Pt will perform mod complex environmental scanning in a busy environment with 90% or better accuracy.   Time 8   Period Weeks   Status Not reassessed     OT LONG TERM GOAL #8   Title Pt will demonstrate improved fine motor coordination for ADLs as evidenced by decreasing bilateral 9 hole peg test score by 3 secs.   Baseline RUE 42.84 secs, LUE 34.53 secs   Time 8   Period Weeks   Status Not tested    OCCUPATIONAL THERAPY DISCHARGE SUMMARY  Visits from Start of  Care: 21  Current functional level related to goals / functional outcomes: Pt did not fully meet all goals due to the severity of cognitive and visual perceptual deficits, and limited caregiver support which impacted pt carryover. Goals were not fully reassessed as pt/ sister cancelled pt's last visit due to financial concerns.   Remaining deficits: Cognitive deficits, visual perceptual deficits, decreased coordination, rigidity, bradykinesia   Education / Equipment: Pt was educated regarding HEP. Pt verbalized understanding.Therapist spoke with pt's [name] and his sister. Therapist has recommended pt has increased caregiver support. Pt's brother reports he is considering moving pt to ALF. Pt may benefit from additional therapy if he transitions to ALF, however he should not return to therapy until he has increased caregiver support for carryover at home.  Plan: Patient agrees to discharge.  Patient goals were not met. Patient is being discharged due to financial reasons.  ?????     Eric Pope 11/28/2016, 4:56 PM  [name] [address] [place], [place], [address] Phone: [phone]   Fax:  [phone]

## 2016-11-29 NOTE — Telephone Encounter (Signed)
F/u Message  Pt call to f/u on fax. Pt was informed that the fax was received and sent back to [facility] and that the nurse would be returning his call upon response.  Please call back to discuss

## 2016-11-29 NOTE — Telephone Encounter (Signed)
Left detailed message on patient's VM (DPR on file) stating that the forms for Tier exception on Pradaxa have been completed and faxed back to [facility] and that we are waiting for a response from them.

## 2016-11-30 ENCOUNTER — Encounter: Payer: Medicare Other | Admitting: Occupational Therapy

## 2016-11-30 NOTE — Telephone Encounter (Signed)
Follow up ° ° ° °Pt is calling to follow up about this  °

## 2016-11-30 NOTE — Telephone Encounter (Signed)
Eric Pope,  Any update on this?  Thanks!

## 2016-12-01 NOTE — Telephone Encounter (Signed)
I left a detailed message on the pts VM stating that I filled out a form for [employer] for a tier exception for Pradaxa on 7/10 and that I have not heard back as it takes several days sometimes.
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
## 2016-12-14 ENCOUNTER — Ambulatory Visit (INDEPENDENT_AMBULATORY_CARE_PROVIDER_SITE_OTHER): Payer: Medicare Other | Admitting: Physician Assistant

## 2016-12-14 VITALS — BP 126/78 | HR 95 | Ht 67.5 in | Wt 197.0 lb

## 2016-12-14 DIAGNOSIS — I10 Essential (primary) hypertension: Secondary | ICD-10-CM

## 2016-12-14 DIAGNOSIS — I480 Paroxysmal atrial fibrillation: Secondary | ICD-10-CM

## 2016-12-14 NOTE — Patient Instructions (Signed)
Medication Instructions:   Your physician recommends that you continue on your current medications as directed. Please refer to the Current Medication list given to you today.    If you need a refill on your cardiac medications before your next appointment, please call your pharmacy.  Labwork: NONE ORDERED  TODAY    Testing/Procedures: NONE ORDERED  TODAY    Follow-Up: in 2 MONTHS WITH DR Caryl Comes    Any Other Special Instructions Will Be Listed Below (If Applicable).

## 2016-12-18 ENCOUNTER — Encounter: Payer: Self-pay | Admitting: Neurology

## 2016-12-18 ENCOUNTER — Telehealth: Payer: Self-pay

## 2016-12-18 ENCOUNTER — Telehealth: Payer: Self-pay | Admitting: Internal Medicine

## 2016-12-18 ENCOUNTER — Ambulatory Visit (INDEPENDENT_AMBULATORY_CARE_PROVIDER_SITE_OTHER): Payer: Medicare Other | Admitting: Neurology

## 2016-12-18 VITALS — BP 140/91 | HR 54 | Wt 197.4 lb

## 2016-12-18 DIAGNOSIS — I63441 Cerebral infarction due to embolism of right cerebellar artery: Secondary | ICD-10-CM

## 2016-12-18 NOTE — Patient Instructions (Signed)
I had a long discussion the patient with regards to his recent embolic stroke and atrial fibrillation/flutter. I recommend he continue Pradaxa for secondary stroke prevention and maintain strict control of hypertension with blood pressure goal below 130/90 and lipids with LDL cholesterol goal below 70 mg percent. I encouraged him to continue to eat healthy and be active .Continue Azilect for his tremors and follow-up with his neurologist Dr. Sherron Ales for the same. The patient's visual fields and have it improved significantly and he is neurologically cleared to drive again. DMV paperwork for the same was filled out. He'll return for follow-up in a year or call earlier if necessary.

## 2016-12-18 NOTE — Telephone Encounter (Signed)
Walk In Pt Form-DMV paperwork dropped off placed in [place] Doc box.
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
## 2016-12-18 NOTE — Telephone Encounter (Signed)
DMV form done by Dr. Leonie Man. The 50.00 fee was waived this time. IF patient needs any future forms to be done pt will have to pay 93.55 fee per [facility] policy.

## 2016-12-21 NOTE — Telephone Encounter (Signed)
Called and informed patient and wife that we received a letter from [facility] regarding tier change on PRADAXA. This medication was lowered from a tier 4 to a tier 3.

## 2016-12-21 NOTE — Telephone Encounter (Signed)
Cardiovascular portion of the patient's DMV form was completed by Dr. Caryl Comes- this has been mailed back to the patient with a copy of his medication list.

## 2016-12-28 ENCOUNTER — Ambulatory Visit: Payer: Medicare Other | Admitting: Internal Medicine

## 2017-01-31 ENCOUNTER — Ambulatory Visit: Payer: Medicare Other | Admitting: Internal Medicine

## 2017-02-02 ENCOUNTER — Encounter: Payer: Self-pay | Admitting: Internal Medicine

## 2017-02-15 ENCOUNTER — Encounter: Payer: Self-pay | Admitting: Internal Medicine

## 2017-02-15 ENCOUNTER — Ambulatory Visit (INDEPENDENT_AMBULATORY_CARE_PROVIDER_SITE_OTHER): Payer: Medicare Other | Admitting: Internal Medicine

## 2017-02-15 VITALS — BP 160/90 | HR 68 | Ht 67.0 in | Wt 194.0 lb

## 2017-02-15 DIAGNOSIS — I10 Essential (primary) hypertension: Secondary | ICD-10-CM | POA: Diagnosis not present

## 2017-02-15 DIAGNOSIS — Z79899 Other long term (current) drug therapy: Secondary | ICD-10-CM

## 2017-02-15 DIAGNOSIS — I481 Persistent atrial fibrillation: Secondary | ICD-10-CM

## 2017-02-15 DIAGNOSIS — I4819 Other persistent atrial fibrillation: Secondary | ICD-10-CM

## 2017-02-15 MED ORDER — DILTIAZEM HCL ER COATED BEADS 120 MG PO CP24: 120.0000 mg | ORAL_CAPSULE | Freq: Every day | ORAL | 3 refills | Status: DC

## 2017-02-15 NOTE — Patient Instructions (Signed)
Medication Instructions:  Your physician has recommended you make the following change in your medication:  1. STOP Verapamil 2. START Diltiazem 120 mg daily  -- If you need a refill on your cardiac medications before your next appointment, please call your pharmacy. --  Labwork: Today: BMET & CBC w/ diff  Testing/Procedures: None ordered  Follow-Up: Your physician wants you to follow-up in: 6 months with Dr. Caryl Comes.  You will receive a reminder letter in the mail two months in advance. If you don't receive a letter, please call our office to schedule the follow-up appointment.  Thank you for choosing [facility]!!

## 2017-02-15 NOTE — Progress Notes (Signed)
He      Patient Care Team: Vladimir Creeks, MD as PCP - General (Vascular Surgery)   HPI  Eric Pope is a 76 y.o. male Seen because of persistent atrial fibrillation for which he was started on flecainide with reversion to sinus but now again in AFib We will going to undertake GXT for one C therapy, however, his tremor makes this unfeasible.   He thinks that he has been lead poisoned by supplements. He is undergoing chelation therapy and his tremor is abating.  Past Medical History:  Diagnosis Date  . Chronic left shoulder pain   . Hypertension   . Kidney stones   . Lead poisoning   . Nephrolithiasis   . Stroke ([place])   . 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 Vision abnormalities     Past Surgical History:  Procedure Laterality Date  . CHOLECYSTECTOMY    . LITHOTRIPSY      Current Outpatient Prescriptions  Medication Sig Dispense Refill  . Calcium Acetate-Magnesium Carb (MAGNEBIND 200) 450-200 MG TABS Take by mouth.    . Carbidopa-Levodopa (RYTARY PO) Take by mouth 3 (three) times daily.    . Coenzyme Q10 (COQ10) 100 MG CAPS Take by mouth.    . dabigatran (PRADAXA) 150 MG CAPS capsule Take 1 capsule (150 mg total) by mouth 2 (two) times daily. 60 capsule 6  . folic acid (FOLVITE) 1 MG tablet Take 1 tablet (1 mg total) by mouth daily. 30 tablet 0  . losartan (COZAAR) 100 MG tablet Take 100 mg by mouth daily.   1  . MAGNESIUM GLUCONATE PO Take 400 mg by mouth.    . Melatonin 3 MG CAPS Take by mouth.    . NON FORMULARY Vitamin C powder mixed with sodium bicarb: Drink daily as directed    . OVER THE COUNTER MEDICATION Hemp oil: One squirt under the tongue twice a day to treat tremors    . propafenone (RYTHMOL) 225 MG tablet Take 1 tablet (225 mg total) by mouth 2 (two) times daily. 60 tablet 6  . rasagiline (AZILECT) 1 MG TABS tablet Take 1 mg by mouth daily.     Marland Kitchen spironolactone (ALDACTONE) 25 MG tablet Take 12.5 mg by mouth daily.     . verapamil (CALAN-SR) 120 MG  CR tablet Take 1 tablet (120 mg total) by mouth daily. 30 tablet 6   No current facility-administered medications for this visit.     Allergies  Allergen Reactions  . Fish-Derived Products Other (See Comments)    Mercury content poisoned his body  . Kale Other (See Comments)    Caused toxicity in the body  . Milk-Related Compounds Other (See Comments)    Increases phlegm  . Ciprofloxacin Other (See Comments)    Causes headaches  . Latex Itching      Review of Systems negative except from HPI and PMH  Physical Exam BP (!) 160/90   Pulse 68   Ht 5\' 7"  (1.702 m)   Wt 194 lb (88 kg)   SpO2 95%   BMI 30.38 kg/m  Well developed and nourished in no acute distress HENT normal Neck supple with JVP-flat Carotids brisk and full without bruits Clear Regular rate and rhythm, no murmurs or gallops Abd-soft with active BS without hepatomegaly No Clubbing cyanosis edema Skin-warm and dry A & Oriented  Grossly normal sensory and motor function  tremor   ECG  sinus rhythm at 60 Intervals 28/08/40   Assessment and  Plan  Atrial fibrillation-paroxysmal  Hypertension  Question lead poisoning  We will continue him on propafenone.  He is constipated augmented verapamil and we will try diltiazem again although we did not tolerate previously. He will let us know  .On Anticoagulation;  No bleeding issues ]    And start verapamil and stop dilt       Current medicines are reviewed at length with the patient today .  The patient does   have concerns regarding medicines As above

## 2017-02-16 ENCOUNTER — Telehealth: Payer: Self-pay | Admitting: Internal Medicine

## 2017-02-16 LAB — BASIC METABOLIC PANEL
BUN / CREAT RATIO: 19 (ref 10–24)
BUN: 20 mg/dL (ref 8–27)
CHLORIDE: 104 mmol/L (ref 96–106)
CO2: 26 mmol/L (ref 20–29)
Calcium: 9.6 mg/dL (ref 8.6–10.2)
Creatinine, Ser: 1.05 mg/dL (ref 0.76–1.27)
GFR calc non Af Amer: 70 mL/min/{1.73_m2} (ref >59)
GFR, EST AFRICAN AMERICAN: 81 mL/min/{1.73_m2} (ref >59)
GLUCOSE: 98 mg/dL (ref 65–99)
Potassium: 4.6 mmol/L (ref 3.5–5.2)
Sodium: 145 mmol/L — ABNORMAL HIGH (ref 134–144)

## 2017-02-16 LAB — CBC WITH DIFFERENTIAL/PLATELET
BASOS ABS: 0 10*3/uL (ref 0.0–0.2)
Basos: 0 %
EOS (ABSOLUTE): 0.1 10*3/uL (ref 0.0–0.4)
Eos: 2 %
Hematocrit: 41 % (ref 37.5–51.0)
Hemoglobin: 14.6 g/dL (ref 13.0–17.7)
IMMATURE GRANS (ABS): 0 10*3/uL (ref 0.0–0.1)
Immature Granulocytes: 0 %
LYMPHS: 18 %
Lymphocytes Absolute: 1.2 10*3/uL (ref 0.7–3.1)
MCH: 31.7 pg (ref 26.6–33.0)
MCHC: 35.6 g/dL (ref 31.5–35.7)
MCV: 89 fL (ref 79–97)
MONOS ABS: 0.6 10*3/uL (ref 0.1–0.9)
Monocytes: 9 %
NEUTROS ABS: 4.6 10*3/uL (ref 1.4–7.0)
NEUTROS PCT: 71 %
PLATELETS: 223 10*3/uL (ref 150–379)
RBC: 4.61 x10E6/uL (ref 4.14–5.80)
RDW: 13.1 % (ref 12.3–15.4)
WBC: 6.5 10*3/uL (ref 3.4–10.8)

## 2017-02-16 NOTE — Telephone Encounter (Signed)
I left a message for the patient to call. 

## 2017-02-16 NOTE — Telephone Encounter (Signed)
Pt needs a call back about his medications please he has some questions.

## 2017-02-16 NOTE — Telephone Encounter (Signed)
I left a message for the patient to call back on Monday.

## 2017-02-16 NOTE — Telephone Encounter (Signed)
F/u Message  Pt returning Rn call .please call back to discuss

## 2017-02-16 NOTE — Telephone Encounter (Signed)
Attempted to call the patient- I left a message that I will call back before I leave today.

## 2017-02-19 NOTE — Telephone Encounter (Signed)
Pt is aware and agreeable to normal results. He wanted to know if Dr. Caryl Comes is agreeable with him STOPPING Pradaxa and starting a high dose fish oil as well as Nattokinase to replace Pradaxa therapy. I told him I would send the question to Dr. Caryl Comes and whenever he made recommendations I would let him know. He is agreeable with me leaving any recommendation on his home voicemail. We are both agreeable to plan.

## 2017-02-19 NOTE — Telephone Encounter (Signed)
lmtcb for results. 

## 2017-02-20 NOTE — Telephone Encounter (Signed)
Reviewed replacing pradaxa with nattokinase/ fish oil with Dr. Caryl Comes- Per Dr. Caryl Comes, there is not enough data to support this change. Would recommend that he continue Pradaxa therapy. I have called and spoken with the patient and he is agreeable.

## 2017-02-26 ENCOUNTER — Telehealth: Payer: Self-pay | Admitting: Neurology

## 2017-02-26 NOTE — Telephone Encounter (Signed)
Message sent to Df. Leonie Man.

## 2017-02-26 NOTE — Telephone Encounter (Signed)
Pt called request to speak with Dr Leonie Man. He wanting to discuss driving. He said visual fields are normal.

## 2017-02-26 NOTE — Telephone Encounter (Signed)
No. he can drive if his eye doctor has cleared him

## 2017-02-27 NOTE — Telephone Encounter (Signed)
Per Dr. Leonie Man letter was written for patient to drive for patient to have. Letter put in mail to his son address.

## 2017-02-27 NOTE — Telephone Encounter (Signed)
Left vm for patient to call back to give Dr. Leonie Man advice on driving.

## 2017-02-27 NOTE — Telephone Encounter (Signed)
Rn call patient that he drive per Dr. Leonie Man. Rn stated per Dr. Leonie Man he needs to be cleared with his eye doctor. Pt stated he was cleared by his MD. Pt wants a letter stating that sent to his son address in [place]. Pt gave [address] [place] [address].

## 2017-02-27 NOTE — Telephone Encounter (Signed)
Rn spoke with Dr. 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 PEr Dr. Leonie Man if he wants to see patients, there will need to be a neurocognitive testing done before that can be approve.

## 2017-02-27 NOTE — Telephone Encounter (Signed)
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  He said if Dr Leonie Man needs to speak with Dr Koleen Distance to please call.

## 2017-02-27 NOTE — Telephone Encounter (Signed)
Pt asked for RN Katrina , he called to inform he would like to have the neurocognitive testing done.  No call back requested

## 2017-02-27 NOTE — Telephone Encounter (Signed)
Pt returned RN's call °

## 2017-02-27 NOTE — Telephone Encounter (Signed)
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 Please call

## 2017-02-28 NOTE — Telephone Encounter (Signed)
I spoke to Dr. Dahlia Client and clarified his concerns. He stated that he had already been working after his stroke in March and his partner Dr. Rayburn Go. Identified no concerns. I informed him that I did not specifically do cognitive testing and could not give him a letter stating so. Dr. Marcell Barlow will contact me if he had any concerns about his ability to work. He voiced understanding

## 2017-02-28 NOTE — Telephone Encounter (Signed)
I called and left a message on the patient's answering machine to call me back to clarify his request

## 2017-03-01 ENCOUNTER — Telehealth: Payer: Self-pay | Admitting: Internal Medicine

## 2017-03-01 NOTE — Telephone Encounter (Signed)
See previous note. Pt can drive per DR.Sethi. His eye doctor cleared him to drive.Letter was put in address to his son residence in [place].

## 2017-03-01 NOTE — Telephone Encounter (Signed)
New Message ° ° pt verbalized that he is returning call for rn  °

## 2017-03-01 NOTE — Telephone Encounter (Signed)
I called the pt back and LVM advising him of the letter.

## 2017-03-01 NOTE — Telephone Encounter (Signed)
Patient calling in about his medication diltiazem. Patient wanted to know if the medication causes any sexual dysfunction. Patient stated he was switched from verapamil due to constipation. 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 Will forward to Dr. Caryl Comes.

## 2017-03-01 NOTE — Telephone Encounter (Signed)
°  New Prob   Has some questions regarding his Diltiazem medication. Please call.

## 2017-03-01 NOTE — Telephone Encounter (Signed)
Pt has called back today. He is wanting to get letter from [place] stating he can drive. He said Dr Katy Fitch has cleared him to drive.

## 2017-03-02 NOTE — Telephone Encounter (Signed)
I returned call and spoke with pt. He has concerns as outlined below.  No new concerns. Wondering if combination of Micardis, CoQ10 and Magnesium may work for him in place of Verapamil.   I told pt message has been sent to Dr. Caryl Comes.  Pt made aware Dr. Caryl Comes will be back in office next week.

## 2017-03-04 NOTE — Telephone Encounter (Signed)
No the vderapamil is for rate control and what he is taking wont work  Surveyor, quantity

## 2017-03-05 ENCOUNTER — Telehealth: Payer: Self-pay | Admitting: Neurology

## 2017-03-05 NOTE — Telephone Encounter (Signed)
Rn left message on patients phone:Dr.Sethi has spoke with patient about this. Letter for driving was mailed to [place] last week. The letter for work cannot be done by Dr.SEthi because he did not do cognitive testing on him. Pt is aware of this. IF patient wants testing he will have to schedule an appt with Dr. Leonie Man.Patient has been spoken to about this several times.  Eric Fila, MD at 02/28/2017 10:21 AM   Status: Signed    I spoke to Dr. Dahlia Client and clarified his concerns. He stated that he had already been working after his stroke in March and his partner Dr. Rayburn Go. Identified no concerns. I informed him that I did not specifically do cognitive testing and could not give him a letter stating so. Dr. Marcell Barlow will contact me if he had any concerns about his ability to work. He voiced understanding

## 2017-03-05 NOTE — Telephone Encounter (Signed)
Pt has called asking for RN Katrina, and is asking for a call back to discuss the letter being prepared for him

## 2017-03-05 NOTE — Telephone Encounter (Signed)
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 He states that he did not tolerate verapamil due to constipation. 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  In the meantime he would like to d/c diltiazem and monitor HR and BP. I advised him to call our office if he has sustained HR >90 bpm or with other questions or concerns. He verbalized understanding and agreement and states he will continue other medications, including Rythmol and Pradaxa. He was very grateful for the call.  Patient called back to request to switch to telmisartan (Micardis) from losartan. He states there are anti-aging properties associated with Micardis and he tolerated it well in the past. He state she may have been changed due to insurance. He asked me to also tell Dr. Caryl Comes that he is participating in a study with Dr. Steva Colder @ [facility] on correlation between bone lead levels and arrhythmias. He states he believes the name of the study is [employer]'s normogenic aging study. He thanked me again for my help.

## 2017-03-05 NOTE — Telephone Encounter (Signed)
Please see previous phone calls. Letter for driving was mail last week. Dr. Leonie Man spoke with patient on 02/28/2017 about he cannot do a letter for work unless he wants cognitive testing.

## 2017-03-05 NOTE — Telephone Encounter (Signed)
Dr. Dahlia Client is calling to discuss previous message about letter from Dr. Koleen Distance.

## 2017-03-05 NOTE — Telephone Encounter (Signed)
Made in error

## 2017-03-06 NOTE — Telephone Encounter (Signed)
I called pt to make sure that he remembers speaking with Katrina/RN yesterday. Pt said "oh yeah I remember". I told him everything was discussed and settled and he was ok with it yesterday. There is multiple phone calls made from the patient,see below.  FYI

## 2017-03-06 NOTE — Telephone Encounter (Signed)
Pt called today wanting to know if Dr Leonie Man has rec'd the letter from Dr Koleen Distance. I advised him it could take 10-14 days for the letter to be rec'd in our office and go thru medical records and to give it a few more days. The patient was insistent on speak with RN regarding this matter.

## 2017-03-06 NOTE — Telephone Encounter (Signed)
Rn spoke with patient yesterday about this matter. DR.Sethi has spoken with patient and receive the letter. PT is aware that Dr. Leonie Man has the letter his PCP wrote that he see patients as a physicians. DR. Leonie Man spoke with patient on 02/28/2017.

## 2017-03-09 MED ORDER — DILTIAZEM HCL ER COATED BEADS 120 MG PO CP24: 120.0000 mg | ORAL_CAPSULE | Freq: Every day | ORAL | 3 refills | Status: AC

## 2017-03-09 NOTE — Telephone Encounter (Signed)
F/u Message  Mr. Nathaneil Canary Pakula call requesting to speak with Rn to f/u on pts bp medication status. Please call back to discuss

## 2017-03-09 NOTE — Telephone Encounter (Signed)
Spoke with patient's POA and brother, Eric Pope, who called to ask about patient's medication because he states the patient is self-medicating and told family that Dr. Caryl Comes has advised he stop some of his medications. Eric Pope states patient has Parkinson's and dementia and is struggling with conventional medical therapy because of his history with holistic medicine. Eric Pope reports that he lives in [place] and patient lives in [place] with his son and he is trying to do damage control. He reports patient's BP is 160/90 mmHg per patient's son. He states he was with the patient at the last ov on 9/27 and that he would like to advise the patient to take the medications according to Dr. Olin Pia advice at that visit. I reviewed the cardiac medication list with him and advised him to call back if additional assistance is needed. He thanked me for my help.

## 2017-03-12 NOTE — Telephone Encounter (Signed)
Pt Dr Dahlia Client has called asking to speak with RN Katrina.  Pt states she called him to discuss letter re: driving and he states he wants to discuss something else of a private nature, he is asking to be called.  Pt was asked if he'd like to go into detail re: what he needs to discuss with RN Katrina, pt again stated that she called him re: his driving and that he has something that he needs to speak with RN Katrina about re: a private nature

## 2017-03-12 NOTE — Telephone Encounter (Signed)
I spoke to Dr Dahlia Client who informed me that he had passed the [facility] driving test and advised him to start driving but the pragmatic and to drive limited and carefully initially mostly during the day and to increase as tolerated. He stated he had also been practicing since February under close observation by his partner Dr. Koleen Distance. I advised him to practice but be pragmatic if he was having issues he may need to be referred  For detailed neuropsych testing and to the [facility] medical board for advice

## 2017-03-13 ENCOUNTER — Encounter: Payer: Self-pay | Admitting: Neurology

## 2017-03-13 NOTE — Telephone Encounter (Signed)
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 He says he may need to speak to Dr. Leonie Man regarding this.

## 2017-03-13 NOTE — Telephone Encounter (Signed)
I called Dr. Dahlia Client and answered his questions. I informed him that I wrote a letter to Dr. Koleen Distance and defer to him to see if patient could practice without any concerns. If he had any concerns patient will have to undergo formal neuropsych testing. Also again counseled him to be cautious with driving and limit it. He voiced understanding.

## 2017-03-14 ENCOUNTER — Telehealth: Payer: Self-pay

## 2017-03-14 NOTE — Telephone Encounter (Signed)
Letter fax to Dr. Koleen Distance at [phone].Letter fax x3 and receive. Letter is from DR. Leonie Man is responding to a correspondance from pts PCP. The patients employer is her PCP.Dr.Sethi has spoken with patient about this several times see previous phone calls.

## 2017-05-07 ENCOUNTER — Telehealth: Payer: Self-pay | Admitting: Internal Medicine

## 2017-05-07 NOTE — Telephone Encounter (Signed)
Dr.Brahm is calling to talk with you about his AFIB . ( Really wants to speak with Dr. Caryl Comes Personally )  Please call . Thanks

## 2017-05-07 NOTE — Telephone Encounter (Signed)
Returned call to patient. Patient states he would like to speak with Dr. Caryl Comes regarding afib and lead toxicity. Informed pt that Dr. Caryl Comes is out of office till Wednesday. I will forward to Dr. Caryl Comes to review. Patient verbalized understanding and thanked me for the call.

## 2017-05-07 NOTE — Telephone Encounter (Signed)
Called to speak with dr Dahlia Client regarding Afib and lead issue No answer

## 2017-05-10 ENCOUNTER — Other Ambulatory Visit: Payer: Self-pay | Admitting: Internal Medicine

## 2017-05-17 ENCOUNTER — Other Ambulatory Visit: Payer: Self-pay | Admitting: Internal Medicine

## 2017-05-17 DIAGNOSIS — I480 Paroxysmal atrial fibrillation: Secondary | ICD-10-CM

## 2017-06-13 NOTE — Therapy (Signed)
[facility] [address] [place], [place], [address] Phone: [phone]   Fax:  [phone]  Patient Details  Name: Eric Pope MRN: [id number] Date of Birth: [birth year] Referring Provider:  No ref. provider found  Encounter Date: 06/13/2017  SPEECH THERAPY DISCHARGE SUMMARY  Visits from Start of Care: 21  Current functional level related to goals / functional outcomes: Pt with mild/minor improvements in functional attention over 21 visits. Significant cognitive linguistic deficits remained in last session - mostly due to poor ability to focus attention to task. Goals and progress at time of last session were as follows. Note number of short term goals met. At time of last note pt was not safe to drive or to live alone.  SLP Short Term Goals - 10/12/16 1209              SLP SHORT TERM GOAL #1    Title Pt will verbalize 3 cognitive impairment with occasional min A over 3 sessions.     Status Not Met         SLP SHORT TERM GOAL #2    Title Pt will solve simple functional reasoning, organization and attention to detail problems with 85% accuracy and occasional min A    Status Not Met         SLP SHORT TERM GOAL #3    Title Pt will demo selective attention for mod complex task for 5 minutes in quiet environment with rare min A back to task    Status Not Met                       SLP Long Term Goals - 10/30/16 1738              SLP LONG TERM GOAL #1    Title Pt will ID errors on cognitive linguistic tasks with occasional min A    Baseline renewed 10/05/16    Time 5    Period Weeks    Status On-going         SLP LONG TERM GOAL #2    Title Pt will solve simple math, reasoning and organization tasks with 80% accuracy and occasional min A    Baseline renewed 10/05/16    Time 5    Period Weeks    Status On-going         SLP LONG TERM GOAL #3    Title Pt will attend to 8 minute simple tasks with occasional  min A, resulting in at least 90% success    Baseline re-newed 10/05/16 - pt attending to simple tasks with 75% to 80% success    Time 5    Period Weeks    Status On-going         SLP LONG TERM GOAL #4    Title Pt will verbalize 3 cognitive impairment with occasional min A over 3 sessions.     Status Achieved         SLP LONG TERM GOAL #5    Title Pt will solve simple functional reasoning, organization and attention to detail problems with 85% accuracy and occasional min A    Baseline renewed 10/05/16    Time 5    Period Weeks    Status On-going         SLP LONG TERM GOAL #6    Title Pt will demo selective attention for mod complex task for 5 minutes in quiet environment with rare min  A back to task    Status Achieved         SLP LONG TERM GOAL #7    Title Pt will utilize notebook or phone to manage follow ups, phone calls, physician appointments with occasional min A over 2 sessions    Baseline recert 01/18/92    Time 5    Period Weeks    Status On-going      Remaining deficits: Significant cognitive linguistic deficits at time of last therapy session. See note below from last session:  SLP stressed to pt that 24/7 caregiver at this time would very likely be best for pt. SLP learned that Ladonna Snide is texting pt to remind him to eat and take his meds. And that pt/Garett have time for primarily errand-running and very little time for formal ST and OT activities. SLP repeated explanation that although pt has shown some minimal progress in awareness and attention, these skills are NOT in a functional nor normal range. Caregiver and SLP learned of opportunity pt shared with them re: living with a family in [place] encouraged pt to talk with brother about this for living situation after 11-19-16.     Education / Equipment: Need to have 24/7 supervision. Local resources for senior services.   Plan: Patient agrees to discharge.  Patient goals were partially met. Patient is being  discharged due to not returning since the last visit.  ?????      [place] ,MS, CCC-SLP  06/13/2017, 10:56 AM  [facility] [address] [place], [place], [address] Phone: [phone]   Fax:  [phone]

## 2017-07-25 ENCOUNTER — Telehealth: Payer: Self-pay | Admitting: Neurology

## 2017-07-25 NOTE — Telephone Encounter (Signed)
Called [name] 3/6 to reschedule him, pt stated he did not want to be rescheduled at this time. He will follow up as needed and will call when he would like to be seen.

## 2017-12-19 ENCOUNTER — Ambulatory Visit: Payer: Medicare Other | Admitting: Neurology

## 2018-03-30 IMAGING — CT CT HEAD W/O CM
4 series · 15 of 47 positions shown, 17 images · non-contrast
Comparison: MRI brain 11/18/2010

CLINICAL DATA: Fall with trauma to right-sided head. Right facial
droop.

EXAM:
CT HEAD WITHOUT CONTRAST
TECHNIQUE: Contiguous axial images were obtained from the base of the skull
through the vertex without intravenous contrast.

[Series 2: head bone · axial · 0.47mm/px · z∈[-98,-82]mm · 2 of 84 slices shown]
[im 9/84  bone]
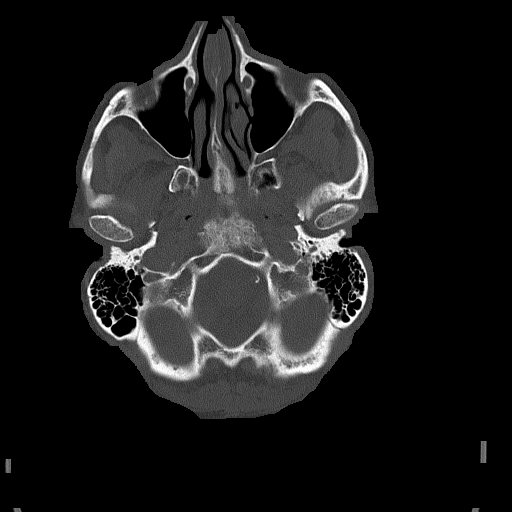
[im 17/84  bone]
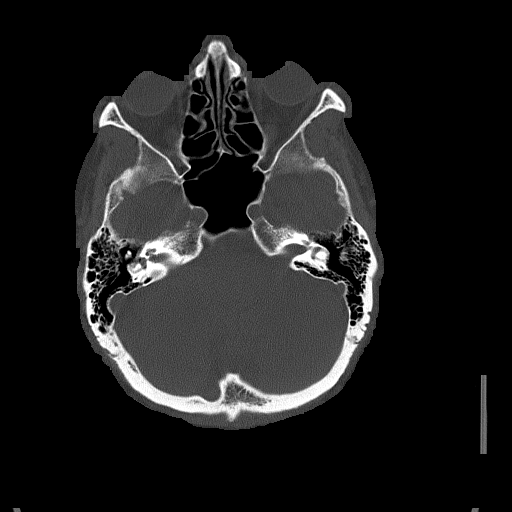

[Series 3: head without · axial · non-contrast · 0.47mm/px · z∈[-94,+26]mm · 7 of 34 slices shown, 9 images]
[im 5/34  brain]
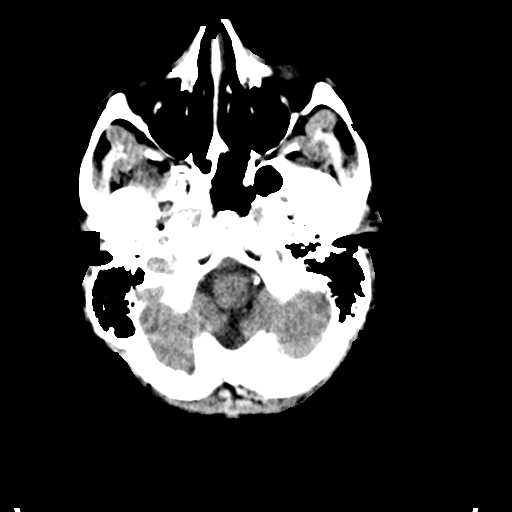
[im 5/34  bone]
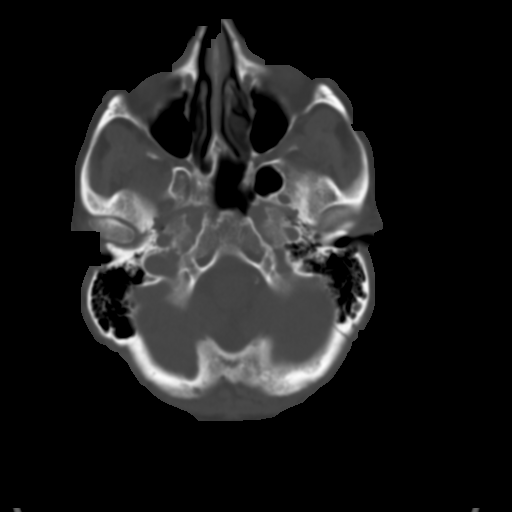
[im 9/34  brain]
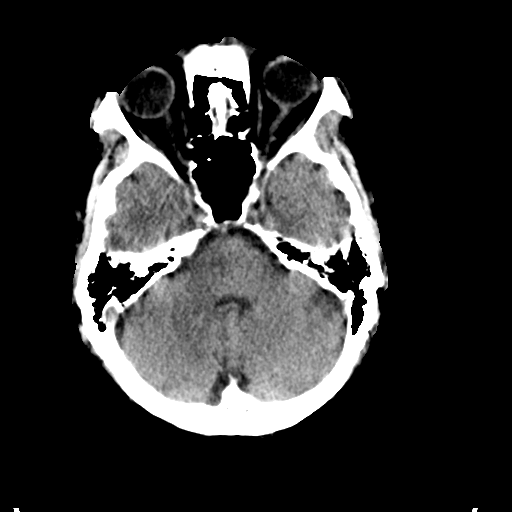
[im 13/34  brain]
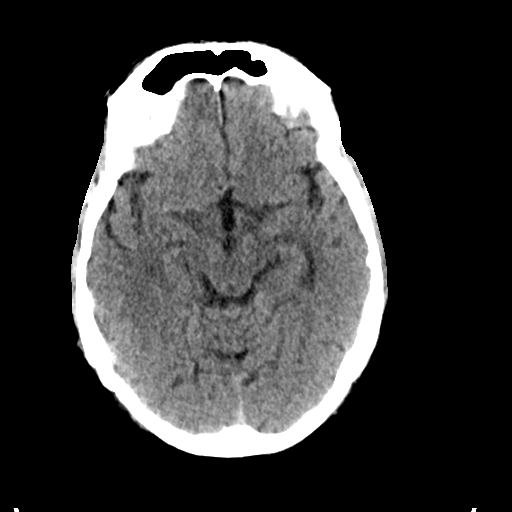
[im 17/34  brain]
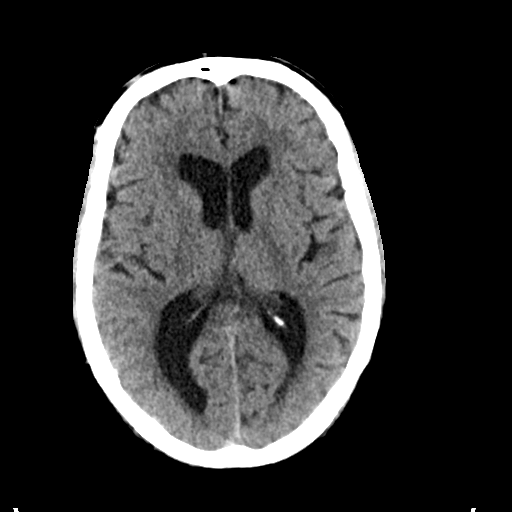
[im 21/34  brain]
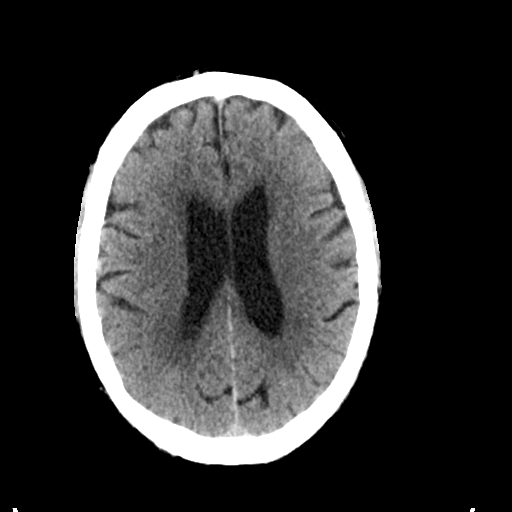
[im 21/34  bone]
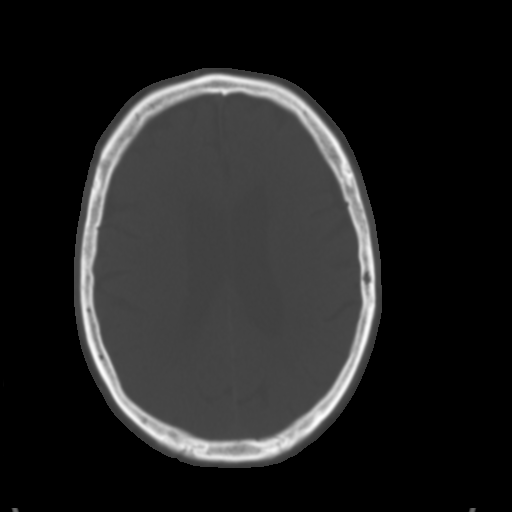
[im 25/34  brain]
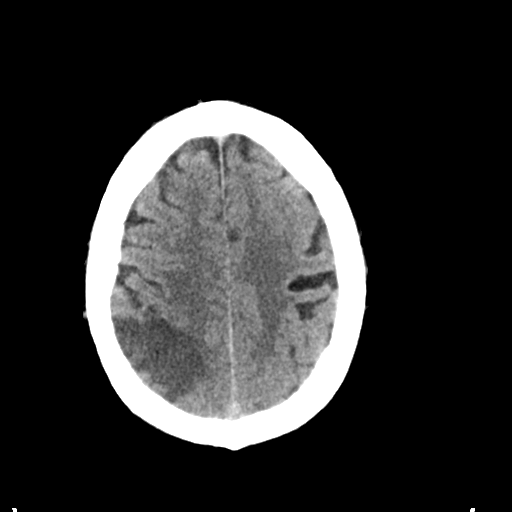
[im 29/34  brain]
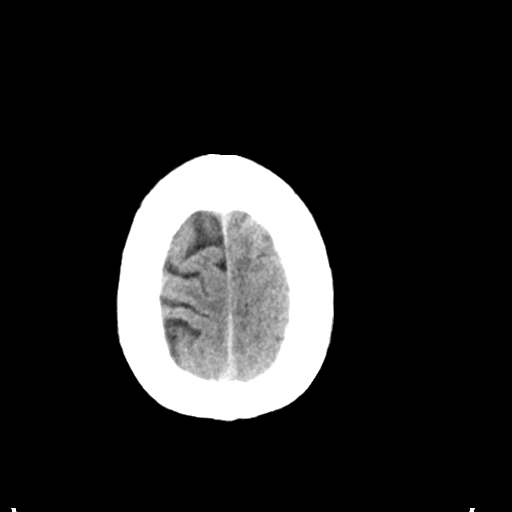

[Series 4: head without cor · coronal · non-contrast · 0.33mm/px · 3 of 70 slices shown]
[im 24/70  brain]
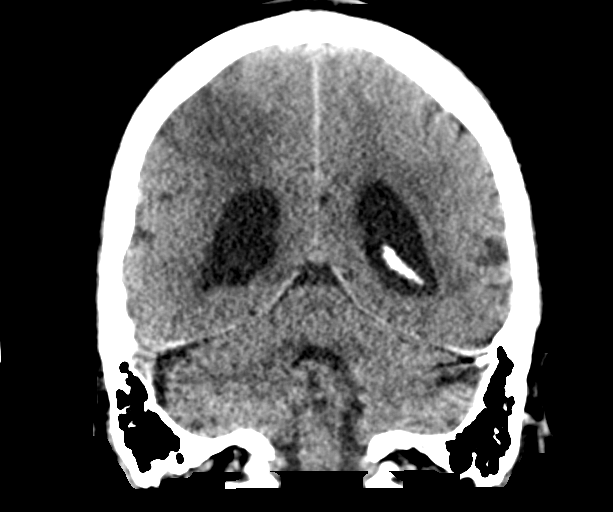
[im 31/70  brain]
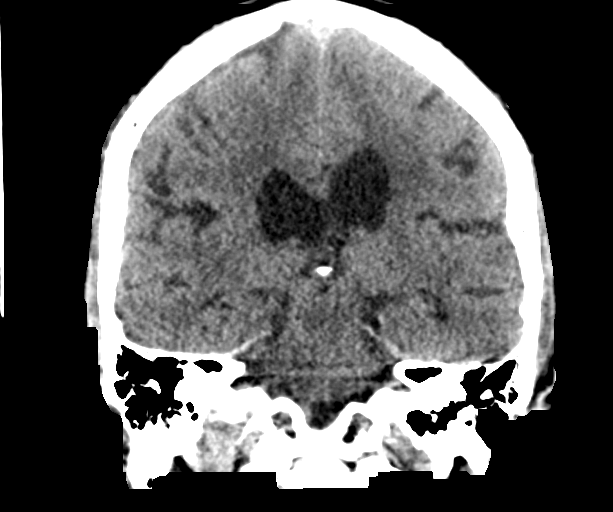
[im 39/70  brain]
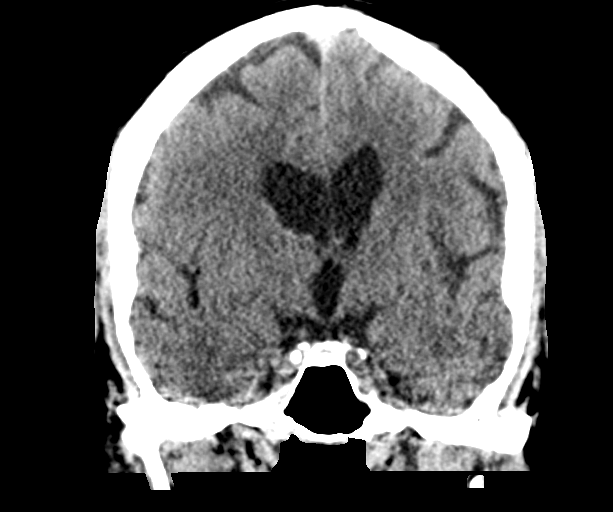

[Series 5: head without sag · sagittal · non-contrast · 0.33mm/px · 3 of 52 slices shown]
[im 18/52  brain]
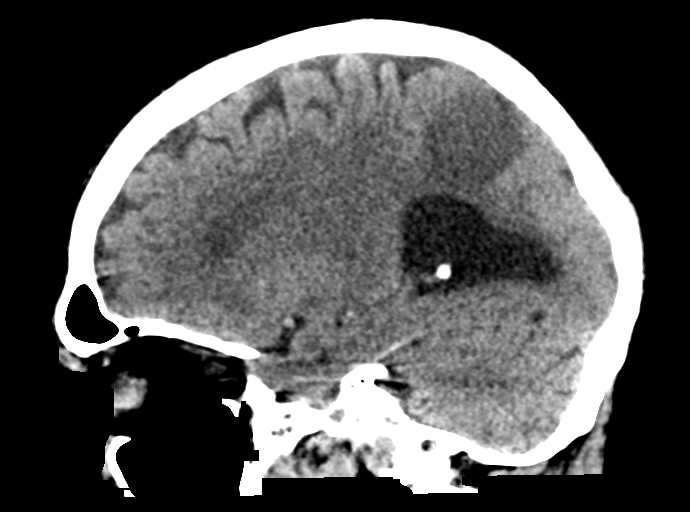
[im 26/52  brain]
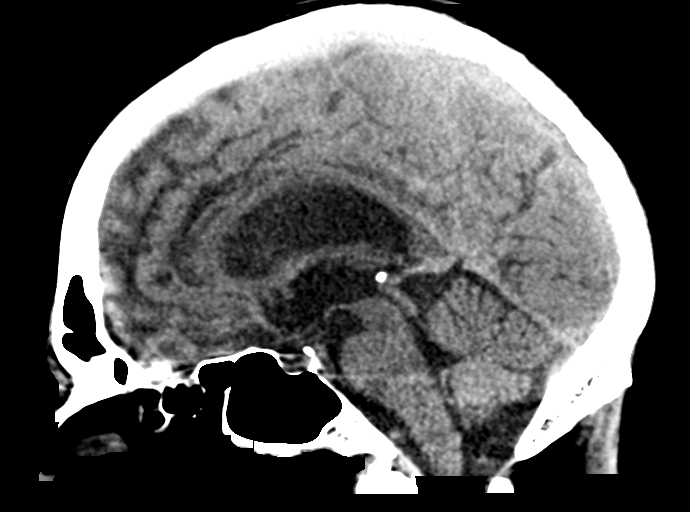
[im 35/52  brain]
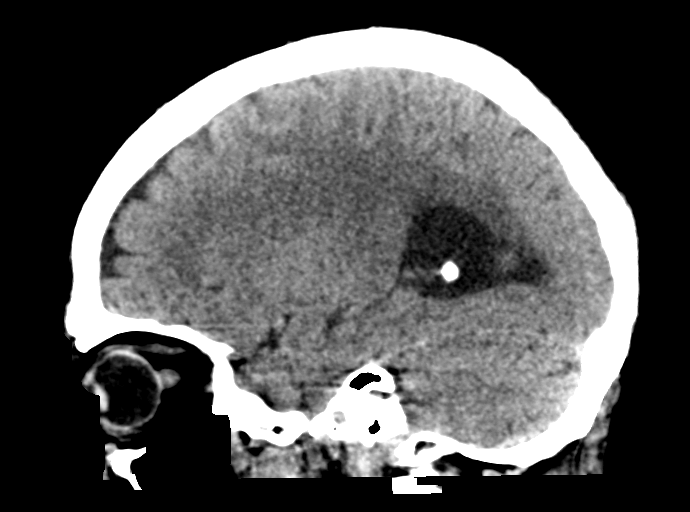

[15 of 47 positions shown; findings below may reference images not displayed]

FINDINGS: Brain: The diffuse area of right parietal hypoattenuation is
present. There is cortical involvement superiorly. There is some
mass effect with effacement of the sulci. No other focal cortical
defect is present. Mild generalized atrophy and white matter disease
is present otherwise.

Vascular: The vessels are diffusely hyperdense without significant
asymmetry. Atherosclerotic calcifications are present within the
cavernous internal carotid arteries and at the dural margin of the
left vertebral artery.

Skull: The calvarium is intact. Mild soft tissue swelling is present
in the right occipital scalp without underlying fracture.

Sinuses/Orbits: Polyps and mucous retention cysts are present
bilaterally in the maxillary sinuses. The paranasal sinuses and
mastoid air cells are otherwise clear. The globes and orbits are
within normal limits.

Other: None available.
IMPRESSION: 1. Right parietal hypoattenuation is most concerning for an acute
posterior right MCA territory infarct. The pattern is somewhat
atypical and underlying tumor mass is not excluded. MRI the brain
without and with contrast could be used for further evaluation.
2. Otherwise stable atrophy and white matter disease.
3. No acute intracranial trauma.
4. Minimal soft tissue swelling of the right occipital scalp without
underlying fracture.
These results were called by telephone at the time of interpretation
on 07/10/2016 at [DATE] to Dr. TOCHKO KUHNS , who verbally
acknowledged these results.

## 2019-02-10 ENCOUNTER — Telehealth: Payer: Self-pay | Admitting: Neurology

## 2019-02-10 ENCOUNTER — Other Ambulatory Visit: Payer: Self-pay

## 2019-02-10 NOTE — Telephone Encounter (Signed)
Pt called stating that when he went to get his licence in the [place] local [place] where he resided's now they informed him that there was an issue at the [facility] office. Pt is needing the letter that was written for him on 02/26/17 stating that it is ok for him to drive to be faxed to the corporate [place] office again. Please advise.

## 2019-02-11 ENCOUNTER — Telehealth: Payer: Self-pay

## 2019-02-11 NOTE — Telephone Encounter (Signed)
Patient called back and stated that Dr.Hayworth has since retired and his office stated that it will be left up to Dr. Leonie Pope on what to do since no one at the other office wants to get involved. Please advise

## 2019-02-11 NOTE — Telephone Encounter (Signed)
Patient mentioned that he needs Dr. Leonie Man to talk to with Dr. Justine Null at [facility] to discuss the letter he wrote in order for him to get a license in [place]. Please advise.

## 2019-02-11 NOTE — Telephone Encounter (Signed)
I spoke to pt and told him thatas per my evaluation in July 2018I cleared him to drive [place] but notes from Dr Justine Null mentioned driving restriction in September sho he is the one he needs to ask to clear him to get licence in [place].he expressed understanding

## 2019-02-12 NOTE — Telephone Encounter (Signed)
I called and left a message for Dr Dahlia Client to see a local neurologist in [place] to get clearance to drive as I have not seen him in 2 years.I am unable to give him a current letter due to time elapsed

## 2019-02-12 NOTE — Telephone Encounter (Signed)
Pt called back and was informed that a message was left for him this morning. Pt was also read the message that was left for him just in case he is not able to get the VM. Pt voiced appreciation.

## 2020-02-19 ENCOUNTER — Telehealth: Payer: Self-pay | Admitting: Neurology

## 2020-02-19 NOTE — Telephone Encounter (Signed)
Mailed pt's records to his home address per his request 02/19/2020. LVM informing pt.

## 2020-03-15 ENCOUNTER — Telehealth: Payer: Self-pay | Admitting: Neurology

## 2020-03-15 NOTE — Telephone Encounter (Signed)
Records mailed to pt's new address 03/15/2020.

## 2021-01-31 ENCOUNTER — Encounter: Admit: 2021-01-31 | Payer: PRIVATE HEALTH INSURANCE

## 2021-01-31 DIAGNOSIS — G919 Hydrocephalus, unspecified: Secondary | ICD-10-CM

## 2021-02-22 ENCOUNTER — Encounter: Admit: 2021-02-22 | Payer: PRIVATE HEALTH INSURANCE

## 2021-03-01 ENCOUNTER — Encounter: Admit: 2021-03-01 | Payer: PRIVATE HEALTH INSURANCE | Attending: Neurological Surgery

## 2021-03-01 ENCOUNTER — Inpatient Hospital Stay: Admit: 2021-03-01 | Discharge: 2021-03-01 | Payer: PRIVATE HEALTH INSURANCE

## 2021-03-02 ENCOUNTER — Telehealth: Admit: 2021-03-02 | Payer: PRIVATE HEALTH INSURANCE | Attending: Neurological Surgery

## 2021-03-02 NOTE — Telephone Encounter
Returned call to pt. I advised him I would provide his contact number to Dr Sarita Haver. Dr Sarita Haver will contact him to discuss eShunt. He verbalized understanding.

## 2021-03-02 NOTE — Telephone Encounter
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  no  Best telephone number for callback:   [phone] Best time to return call:   anytime Permission to leave message:  yes   Jeremiah Black C [facility]

## 2021-09-26 ENCOUNTER — Telehealth: Admit: 2021-09-26 | Payer: PRIVATE HEALTH INSURANCE | Attending: Neurological Surgery

## 2021-09-26 NOTE — Telephone Encounter
LVM for patient to return call for scheduling

## 2021-09-26 NOTE — Telephone Encounter
Pt real question using  any information Rapamycian for Hydrocephalus in a research trial at  [facility]- I scheduled Phone Consult  with Dr.Matouk

## 2021-09-26 NOTE — Telephone Encounter
Jeremiah Black from the answering service- calling with a message that this patient is trying to reach Dr. Sarita Haver, but had no further info than that. Patient can be reached @ [phone]

## 2021-09-27 NOTE — Telephone Encounter
S/w Jillene Bucks r/s time to 11::45 am  On 5/16 with Dr.Matouk

## 2021-10-04 ENCOUNTER — Telehealth: Admit: 2021-10-04 | Payer: PRIVATE HEALTH INSURANCE | Attending: Neurological Surgery

## 2021-10-04 ENCOUNTER — Encounter: Admit: 2021-10-04 | Payer: PRIVATE HEALTH INSURANCE | Attending: Neurological Surgery

## 2021-10-04 DIAGNOSIS — G912 (Idiopathic) normal pressure hydrocephalus: Secondary | ICD-10-CM

## 2021-10-04 DIAGNOSIS — I639 Cerebral infarction, unspecified: Secondary | ICD-10-CM

## 2021-10-04 MED ORDER — AMLODIPINE 5 MG-VALSARTAN 160 MG TABLET
5-160 mg | Status: AC
Start: 2021-10-04 — End: ?

## 2021-10-04 NOTE — Telephone Encounter
Patient called back in confirmed PC 10/06/21 with Dr.Matouk for 1:15pm per Baldo Ash. Sent message to Crystal to double book patient.

## 2021-10-04 NOTE — Telephone Encounter
Called patient to reschedule Phone consult with Dr.Matouk to 10/06/21 1:15 pm, no answer lvm.MATOUK/ RESCHEDULEReceived: Lonna Duval, RN  P [facility] SchedulingPlease reschedule to 1:15 on Friday 10/06/2021.

## 2021-10-04 NOTE — Telephone Encounter
S/w Manas advise appointment is  Friday  @ 1:15 per Baldo Ash

## 2021-10-04 NOTE — Telephone Encounter
lvm to confirm  Friday  Phone visit  @ 1:15  Per Baldo Ash  appt for Friday  5/19MATOUK/ RESCHEDULEReceived: Lonna Duval, RN  P [facility]surgery SchedulingPlease reschedule to 1:15 on Friday 10/06/2021.

## 2021-10-04 NOTE — Telephone Encounter
Spoke with patient wanted to see if someone could provide him with Dr.Stephanie Robert's contact info or if Dr.Matouk could provide him info on phone call Thursday. Sent message to Jeremiah Black to let him know. RE: MATOUK/ RESCHEDULEReceived: TodayGracia, Jeremiah Obey, RN; P [facility] [place], [place] with patient, confirmed [name] for Thursday 10/06/21 with Dr.Matouk at 1:15 pm. Patient is also looking to speak with Dr.Stephanie Molly Maduro regarding Rapamycin. Patient would like a good contact for her or if Dr.Matouk could provide him information on phone call Thursday. Patient could be reached at [phone]. Thank you, Jeremiah Black

## 2021-10-05 NOTE — Progress Notes
TeleHealth Telephone Visit (NOT PERFORMED)16-MAY-2023I was called away to an emergency and was not able to conduct this TeleHealth Telephone Visit. My office knows to re-schedule the patient if she desires.Barney Drain MD FRCS(C)Vice Chair [facility]), Department of NeurosurgerySection Chief of Firefighter, NPH and Disorders of [employer] ProgramY[facility]

## 2021-10-07 ENCOUNTER — Encounter: Admit: 2021-10-07 | Payer: PRIVATE HEALTH INSURANCE | Attending: Neurological Surgery

## 2021-10-07 DIAGNOSIS — G912 (Idiopathic) normal pressure hydrocephalus: Secondary | ICD-10-CM

## 2021-10-10 NOTE — Progress Notes
TELEPHONE VISIT: For this visit the clinician and patient were present via telephone (audio only).Patient counseled on available options for visit type; Patient elected telephone visit; Patient consent given for telephone visit: YesPatient Identity was confirmed during this call.? Other individuals actively participating in the telephone encounter and their name/relation to the patient: noneTotal time spent in medical telephone consultation: 25 minutesBecause this visit was completed over telephone, a hands-on physical exam was not performed.? Patient understands and knows to call back if condition changes. 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 He participated today in clinic via a TeleHealth TelePhone Visit. This is a SECOND OPINION and screening for clinical trial enrollment. He was unaccompanied. 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 His PMHx is significant for HTN and essential tremor (treated with HIFU at [facility] ~ 1-year ago). 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 More recently, Dr. Zenda Black developed progressive gait instability. 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 Unfortunately, he continues to have significant difficulty with balance. He does not feel the shunt is working. Imaging:  I have personally reviewed an MRI brain performed 01/27/2021 that demonstrates mild-to-moderate ventriculomegaly with sequelae of old R parietal stroke. Note is made of associated, asymmetric dilatation of the posterior R lateral ventricle adjacent to the R parietal (chronic) stroke. Impression/Plan:  Overall, Dr. Zenda Black and I discussed that he is not a candidate for the eShunt Clinical Research Study. We discussed emerging evidence of rapamycin in animal models (work done here at [employer]). However there is no currently enrolling clinical trial of rapamycin for NPH.I encouraged him to work with his local neurosurgeon(s) to ensure that his shunt is working optimally. At this time, I do not need to see Dr. Zenda Black in my neurosurgical clinic one a routine basis nor does he need routine follow-up imaging. Of course, I remain available to him as the need arises.Thank you for entrusting me with the care of your patient. Please do not hesitate to contact me directly with any questions or concerns. Sincerely,CharlesCharles Wave Calzada MD FRCS(C)Vice Chair [facility]), Department of NeurosurgerySection Chief of Firefighter, NPH and Disorders of [employer] ProgramY[facility]

## 2021-12-01 ENCOUNTER — Telehealth: Admit: 2021-12-01 | Payer: PRIVATE HEALTH INSURANCE | Attending: Neurological Surgery

## 2021-12-01 NOTE — Telephone Encounter
[facility]ENTER MESSAGETime of call:   5:37 PMCaller:  LaurenceCaller's relationship to patient:  Horticulturist, commercial from (pharmacy, hospital, agency, etc.):  n/a   Reason for call:   Patient was unable to find a provider out in [place] who could flush his shunt, would like to see if Dr.Matouk could refer him to someone. Patient said he is feeling weak and balance is bad. Requested a callback from a nurse to discuss, patient could be reached at [phone].If not feeling well, what are symptoms:  n/a   If having symptoms, how long have the symptoms been present:  n/a   Does caller request to speak to someone urgently?  no   If yes, warm transferred to:  n/aProvider:  Dr. Fransisco Hertz clinic visit date:  05/19/2023Best telephone number for callback:   [phone] Best time to return call (encourage patient to be available for callback):   Anytime Permission to leave message:  yes   Jenesis Suchy [facility]

## 2021-12-05 ENCOUNTER — Encounter: Admit: 2021-12-05 | Payer: PRIVATE HEALTH INSURANCE | Attending: Neurological Surgery

## 2021-12-05 DIAGNOSIS — G912 (Idiopathic) normal pressure hydrocephalus: Secondary | ICD-10-CM

## 2021-12-05 NOTE — Telephone Encounter
Referral information sent

## 2021-12-05 NOTE — Telephone Encounter
[facility] Phone: [phone] Fax: [phone]. Patient could be reached at [phone].

## 2021-12-05 NOTE — Telephone Encounter
Pt called in to get an update on who [place] his [place]PH. Please advise Jeremiah Black Number: [phone]

## 2021-12-05 NOTE — Telephone Encounter
Left message stating referral is to Dr. Georgia Dom.

## 2021-12-05 NOTE — Telephone Encounter
Pt called in again [facility] [place] [place] Matouk toSarita Havernd over a referral slip to [facility]. Pt also asked if Dr. Sarita Haver can give him a call back directly. Please advise Dr. Leone Payor Number: [phone]

## 2021-12-05 NOTE — Telephone Encounter
PT called back and would like to speak with matouk. I didn't adv him of the name alexander Coon. He still wants to speak with [facility]

## 2023-02-06 ENCOUNTER — Telehealth: Admit: 2023-02-06 | Payer: PRIVATE HEALTH INSURANCE | Attending: Neurological Surgery

## 2023-02-06 NOTE — Telephone Encounter
 Copied from CRM [id number]. Topic: General Message - Y[facility]ARE>> Feb 06, 2023 11:34 AM Carlynn Herald wrote:[facility] C[facility] CENTER MESSAGETime of call:   11:34 AMCaller:   Grullon, LAURENCECaller's relationship to patient:  selfCalling from (pharmacy, hospital, agency, etc.):  n/a   Reason for call:   Requesting to speak with clinical team or chief resident of Neurosurgery regarding new symptoms. Patient states this is an urgent matter, please call ASAP. If not feeling well, what are symptoms:  n/a   If having symptoms, how long have the symptoms been present:  n/a   Does caller request to speak to someone urgently?  YesBest telephone number for callback:   [phone] Best time to return call:   anytimePermission to leave message:  yes   Golden Pop [facility] Referral Specialist

## 2023-02-06 NOTE — Telephone Encounter
 Returned call to patient. He wants to discuss with Dr. Sarita Haver the use of Rapamyacin to treat his NPH. I informed him I would let Dr. Sarita Haver know that he would like to speak with him. He verbalized understanding.
